# Patient Record
Sex: Male | Born: 1950
Health system: Southern US, Community
[De-identification: ages and names within clinical notes are randomized; demographics above are authoritative.]

## PROBLEM LIST (undated history)

## (undated) DIAGNOSIS — M109 Gout, unspecified: Secondary | ICD-10-CM

## (undated) DIAGNOSIS — C61 Malignant neoplasm of prostate: Secondary | ICD-10-CM

## (undated) DIAGNOSIS — M1990 Unspecified osteoarthritis, unspecified site: Secondary | ICD-10-CM

## (undated) DIAGNOSIS — E119 Type 2 diabetes mellitus without complications: Secondary | ICD-10-CM

## (undated) DIAGNOSIS — C801 Malignant (primary) neoplasm, unspecified: Secondary | ICD-10-CM

## (undated) DIAGNOSIS — E785 Hyperlipidemia, unspecified: Secondary | ICD-10-CM

## (undated) HISTORY — PX: PROSTATE SURGERY: SHX751

## (undated) HISTORY — DX: Type 2 diabetes mellitus without complications: E11.9

## (undated) HISTORY — DX: Hyperlipidemia, unspecified: E78.5

## (undated) HISTORY — DX: Malignant neoplasm of prostate: C61

## (undated) HISTORY — DX: Gout, unspecified: M10.9

---

## 2000-08-31 ENCOUNTER — Encounter: Payer: Self-pay | Admitting: Family Medicine

## 2000-08-31 ENCOUNTER — Encounter: Admission: RE | Admit: 2000-08-31 | Discharge: 2000-08-31 | Payer: Self-pay | Admitting: Family Medicine

## 2002-05-25 DIAGNOSIS — C801 Malignant (primary) neoplasm, unspecified: Secondary | ICD-10-CM

## 2002-05-25 HISTORY — DX: Malignant (primary) neoplasm, unspecified: C80.1

## 2002-09-25 ENCOUNTER — Ambulatory Visit (HOSPITAL_COMMUNITY): Admission: RE | Admit: 2002-09-25 | Discharge: 2002-09-25 | Payer: Self-pay | Admitting: Gastroenterology

## 2003-06-26 ENCOUNTER — Inpatient Hospital Stay (HOSPITAL_COMMUNITY): Admission: RE | Admit: 2003-06-26 | Discharge: 2003-06-29 | Payer: Self-pay | Admitting: Urology

## 2012-09-13 ENCOUNTER — Ambulatory Visit (INDEPENDENT_AMBULATORY_CARE_PROVIDER_SITE_OTHER): Payer: 59 | Admitting: *Deleted

## 2012-09-13 DIAGNOSIS — Z2911 Encounter for prophylactic immunotherapy for respiratory syncytial virus (RSV): Secondary | ICD-10-CM

## 2012-09-13 DIAGNOSIS — Z23 Encounter for immunization: Secondary | ICD-10-CM

## 2012-09-13 DIAGNOSIS — Z111 Encounter for screening for respiratory tuberculosis: Secondary | ICD-10-CM

## 2012-09-15 ENCOUNTER — Encounter (INDEPENDENT_AMBULATORY_CARE_PROVIDER_SITE_OTHER): Payer: 59 | Admitting: Family Medicine

## 2012-09-15 LAB — TB SKIN TEST
Induration: 0 mm
TB Skin Test: NEGATIVE

## 2012-09-15 NOTE — Progress Notes (Signed)
This encounter was created in error - please disregard.

## 2012-10-20 ENCOUNTER — Telehealth: Payer: Self-pay | Admitting: Family Medicine

## 2012-10-20 MED ORDER — ROSUVASTATIN CALCIUM 40 MG PO TABS: 40.0000 mg | ORAL_TABLET | Freq: Every day | ORAL | Status: DC

## 2012-10-20 MED ORDER — FEBUXOSTAT 80 MG PO TABS: 80.0000 mg | ORAL_TABLET | Freq: Every day | ORAL | Status: DC

## 2012-10-20 NOTE — Telephone Encounter (Signed)
Rx Refilled  

## 2012-10-20 NOTE — Telephone Encounter (Signed)
Med rf °

## 2013-01-19 ENCOUNTER — Encounter: Payer: Self-pay | Admitting: Family Medicine

## 2013-01-19 ENCOUNTER — Ambulatory Visit (INDEPENDENT_AMBULATORY_CARE_PROVIDER_SITE_OTHER): Payer: 59 | Admitting: Family Medicine

## 2013-01-19 VITALS — BP 136/90 | HR 80 | Temp 98.3°F | Resp 16 | Wt 281.0 lb

## 2013-01-19 DIAGNOSIS — M109 Gout, unspecified: Secondary | ICD-10-CM | POA: Insufficient documentation

## 2013-01-19 DIAGNOSIS — T22299A Burn of second degree of multiple sites of unspecified shoulder and upper limb, except wrist and hand, initial encounter: Secondary | ICD-10-CM

## 2013-01-19 DIAGNOSIS — T22292A Burn of second degree of multiple sites of left shoulder and upper limb, except wrist and hand, initial encounter: Secondary | ICD-10-CM

## 2013-01-19 MED ORDER — SILVER SULFADIAZINE 1 % EX CREA: TOPICAL_CREAM | Freq: Every day | CUTANEOUS | Status: DC

## 2013-01-19 MED ORDER — CEPHALEXIN 500 MG PO CAPS: 500.0000 mg | ORAL_CAPSULE | Freq: Three times a day (TID) | ORAL | Status: DC
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
## 2013-03-17 ENCOUNTER — Other Ambulatory Visit: Payer: Self-pay | Admitting: Family Medicine

## 2013-03-17 NOTE — Telephone Encounter (Signed)
Medication refilled per protocol. 

## 2013-06-07 ENCOUNTER — Encounter: Payer: Self-pay | Admitting: Physician Assistant

## 2013-06-07 ENCOUNTER — Ambulatory Visit (INDEPENDENT_AMBULATORY_CARE_PROVIDER_SITE_OTHER): Payer: 59 | Admitting: Physician Assistant

## 2013-06-07 VITALS — BP 142/88 | HR 112 | Temp 96.9°F | Resp 20 | Wt 244.0 lb

## 2013-06-07 DIAGNOSIS — Z8546 Personal history of malignant neoplasm of prostate: Secondary | ICD-10-CM

## 2013-06-07 DIAGNOSIS — R1310 Dysphagia, unspecified: Secondary | ICD-10-CM

## 2013-06-07 DIAGNOSIS — K219 Gastro-esophageal reflux disease without esophagitis: Secondary | ICD-10-CM

## 2013-06-07 DIAGNOSIS — G255 Other chorea: Secondary | ICD-10-CM

## 2013-06-07 DIAGNOSIS — B370 Candidal stomatitis: Secondary | ICD-10-CM

## 2013-06-07 DIAGNOSIS — R634 Abnormal weight loss: Secondary | ICD-10-CM

## 2013-06-07 DIAGNOSIS — H532 Diplopia: Secondary | ICD-10-CM

## 2013-06-07 LAB — COMPLETE METABOLIC PANEL WITH GFR
ALBUMIN: 4.4 g/dL (ref 3.5–5.2)
ALT: 15 U/L (ref 0–53)
AST: 15 U/L (ref 0–37)
Alkaline Phosphatase: 125 U/L — ABNORMAL HIGH (ref 39–117)
BUN: 59 mg/dL — AB (ref 6–23)
CALCIUM: 11.1 mg/dL — AB (ref 8.4–10.5)
CHLORIDE: 88 meq/L — AB (ref 96–112)
CO2: 19 mEq/L (ref 19–32)
Creat: 2.48 mg/dL — ABNORMAL HIGH (ref 0.50–1.35)
GFR, Est African American: 31 mL/min — ABNORMAL LOW
GFR, Est Non African American: 27 mL/min — ABNORMAL LOW
Glucose, Bld: 1254 mg/dL (ref 70–99)
POTASSIUM: 5.9 meq/L — AB (ref 3.5–5.3)
SODIUM: 136 meq/L (ref 135–145)
TOTAL PROTEIN: 9 g/dL — AB (ref 6.0–8.3)
Total Bilirubin: 0.5 mg/dL (ref 0.3–1.2)

## 2013-06-07 LAB — CBC WITH DIFFERENTIAL/PLATELET
BASOS ABS: 0.1 10*3/uL (ref 0.0–0.1)
Basophils Relative: 1 % (ref 0–1)
Eosinophils Absolute: 0 10*3/uL (ref 0.0–0.7)
Eosinophils Relative: 0 % (ref 0–5)
HEMATOCRIT: 46.3 % (ref 39.0–52.0)
HEMOGLOBIN: 14.1 g/dL (ref 13.0–17.0)
LYMPHS PCT: 19 % (ref 12–46)
Lymphs Abs: 1.8 10*3/uL (ref 0.7–4.0)
MCH: 28.7 pg (ref 26.0–34.0)
MCHC: 30.5 g/dL (ref 30.0–36.0)
MCV: 94.3 fL (ref 78.0–100.0)
MONOS PCT: 5 % (ref 3–12)
Monocytes Absolute: 0.4 10*3/uL (ref 0.1–1.0)
NEUTROS ABS: 7.3 10*3/uL (ref 1.7–7.7)
Neutrophils Relative %: 75 % (ref 43–77)
Platelets: 346 10*3/uL (ref 150–400)
RBC: 4.91 MIL/uL (ref 4.22–5.81)
RDW: 13.7 % (ref 11.5–15.5)
WBC: 9.6 10*3/uL (ref 4.0–10.5)

## 2013-06-07 LAB — CK: CK TOTAL: 501 U/L — AB (ref 7–232)

## 2013-06-07 LAB — VITAMIN B12: Vitamin B-12: 1358 pg/mL — ABNORMAL HIGH (ref 211–911)

## 2013-06-07 LAB — HIV ANTIBODY (ROUTINE TESTING W REFLEX): HIV: NONREACTIVE

## 2013-06-07 LAB — TSH: TSH: 0.788 u[IU]/mL (ref 0.350–4.500)

## 2013-06-07 LAB — RPR

## 2013-06-07 MED ORDER — OMEPRAZOLE 20 MG PO CPDR: 20.0000 mg | DELAYED_RELEASE_CAPSULE | Freq: Every day | ORAL | Status: DC

## 2013-06-07 MED ORDER — NYSTATIN 100000 UNIT/ML MT SUSP: 5.0000 mL | Freq: Four times a day (QID) | OROMUCOSAL | Status: DC

## 2013-06-08 ENCOUNTER — Inpatient Hospital Stay (HOSPITAL_COMMUNITY): Payer: 59

## 2013-06-08 ENCOUNTER — Other Ambulatory Visit: Payer: Self-pay

## 2013-06-08 ENCOUNTER — Inpatient Hospital Stay (HOSPITAL_COMMUNITY)
Admission: EM | Admit: 2013-06-08 | Discharge: 2013-06-15 | DRG: 637 | Disposition: A | Discharge status: Home / Self Care | Payer: 59 | Attending: Internal Medicine | Admitting: Internal Medicine

## 2013-06-08 ENCOUNTER — Encounter (HOSPITAL_COMMUNITY): Payer: Self-pay | Admitting: Emergency Medicine

## 2013-06-08 DIAGNOSIS — E119 Type 2 diabetes mellitus without complications: Secondary | ICD-10-CM | POA: Diagnosis present

## 2013-06-08 DIAGNOSIS — R259 Unspecified abnormal involuntary movements: Secondary | ICD-10-CM

## 2013-06-08 DIAGNOSIS — G9341 Metabolic encephalopathy: Secondary | ICD-10-CM | POA: Diagnosis present

## 2013-06-08 DIAGNOSIS — E1110 Type 2 diabetes mellitus with ketoacidosis without coma: Secondary | ICD-10-CM | POA: Diagnosis present

## 2013-06-08 DIAGNOSIS — E870 Hyperosmolality and hypernatremia: Secondary | ICD-10-CM | POA: Diagnosis present

## 2013-06-08 DIAGNOSIS — Z7982 Long term (current) use of aspirin: Secondary | ICD-10-CM

## 2013-06-08 DIAGNOSIS — E785 Hyperlipidemia, unspecified: Secondary | ICD-10-CM | POA: Diagnosis present

## 2013-06-08 DIAGNOSIS — E860 Dehydration: Secondary | ICD-10-CM | POA: Diagnosis present

## 2013-06-08 DIAGNOSIS — E875 Hyperkalemia: Secondary | ICD-10-CM | POA: Diagnosis present

## 2013-06-08 DIAGNOSIS — M6282 Rhabdomyolysis: Secondary | ICD-10-CM | POA: Diagnosis present

## 2013-06-08 DIAGNOSIS — Z833 Family history of diabetes mellitus: Secondary | ICD-10-CM

## 2013-06-08 DIAGNOSIS — Z79899 Other long term (current) drug therapy: Secondary | ICD-10-CM

## 2013-06-08 DIAGNOSIS — E1310 Other specified diabetes mellitus with ketoacidosis without coma: Principal | ICD-10-CM | POA: Diagnosis present

## 2013-06-08 DIAGNOSIS — K219 Gastro-esophageal reflux disease without esophagitis: Secondary | ICD-10-CM | POA: Diagnosis present

## 2013-06-08 DIAGNOSIS — B370 Candidal stomatitis: Secondary | ICD-10-CM | POA: Diagnosis present

## 2013-06-08 DIAGNOSIS — R252 Cramp and spasm: Secondary | ICD-10-CM | POA: Diagnosis present

## 2013-06-08 DIAGNOSIS — M109 Gout, unspecified: Secondary | ICD-10-CM

## 2013-06-08 DIAGNOSIS — Z794 Long term (current) use of insulin: Secondary | ICD-10-CM

## 2013-06-08 DIAGNOSIS — N179 Acute kidney failure, unspecified: Secondary | ICD-10-CM | POA: Diagnosis present

## 2013-06-08 DIAGNOSIS — R634 Abnormal weight loss: Secondary | ICD-10-CM | POA: Diagnosis present

## 2013-06-08 DIAGNOSIS — I889 Nonspecific lymphadenitis, unspecified: Secondary | ICD-10-CM | POA: Diagnosis present

## 2013-06-08 DIAGNOSIS — Z8546 Personal history of malignant neoplasm of prostate: Secondary | ICD-10-CM

## 2013-06-08 DIAGNOSIS — R911 Solitary pulmonary nodule: Secondary | ICD-10-CM | POA: Diagnosis present

## 2013-06-08 DIAGNOSIS — M793 Panniculitis, unspecified: Secondary | ICD-10-CM | POA: Diagnosis present

## 2013-06-08 HISTORY — DX: Malignant (primary) neoplasm, unspecified: C80.1

## 2013-06-08 LAB — CK: Total CK: 2716 U/L — ABNORMAL HIGH (ref 7–232)

## 2013-06-08 LAB — GLUCOSE, CAPILLARY
Glucose-Capillary: >600 mg/dL (ref 70–99)
Glucose-Capillary: >600 mg/dL (ref 70–99)
Glucose-Capillary: >600 mg/dL (ref 70–99)
Glucose-Capillary: >600 mg/dL (ref 70–99)
Glucose-Capillary: >600 mg/dL (ref 70–99)
Glucose-Capillary: 584 mg/dL (ref 70–99)
Glucose-Capillary: 509 mg/dL — ABNORMAL HIGH (ref 70–99)
Glucose-Capillary: 451 mg/dL — ABNORMAL HIGH (ref 70–99)
Glucose-Capillary: 451 mg/dL — ABNORMAL HIGH (ref 70–99)
Glucose-Capillary: 382 mg/dL — ABNORMAL HIGH (ref 70–99)
Glucose-Capillary: 346 mg/dL — ABNORMAL HIGH (ref 70–99)

## 2013-06-08 LAB — CBC WITH DIFFERENTIAL/PLATELET
BASOS ABS: 0 10*3/uL (ref 0.0–0.1)
BASOS PCT: 0 % (ref 0–1)
EOS ABS: 0 10*3/uL (ref 0.0–0.7)
EOS PCT: 0 % (ref 0–5)
HEMATOCRIT: 48.4 % (ref 39.0–52.0)
Hemoglobin: 14.5 g/dL (ref 13.0–17.0)
Lymphocytes Relative: 9 % — ABNORMAL LOW (ref 12–46)
Lymphs Abs: 1.2 10*3/uL (ref 0.7–4.0)
MCH: 28.8 pg (ref 26.0–34.0)
MCHC: 30 g/dL (ref 30.0–36.0)
MCV: 96.2 fL (ref 78.0–100.0)
MONO ABS: 0.8 10*3/uL (ref 0.1–1.0)
Monocytes Relative: 6 % (ref 3–12)
Neutro Abs: 10.7 10*3/uL — ABNORMAL HIGH (ref 1.7–7.7)
Neutrophils Relative %: 85 % — ABNORMAL HIGH (ref 43–77)
Platelets: 308 10*3/uL (ref 150–400)
RBC: 5.03 MIL/uL (ref 4.22–5.81)
RDW: 14.4 % (ref 11.5–15.5)
WBC: 12.6 10*3/uL — ABNORMAL HIGH (ref 4.0–10.5)

## 2013-06-08 LAB — COMPREHENSIVE METABOLIC PANEL
ALT: 18 U/L (ref 0–53)
AST: 26 U/L (ref 0–37)
Albumin: 3.9 g/dL (ref 3.5–5.2)
Alkaline Phosphatase: 121 U/L — ABNORMAL HIGH (ref 39–117)
BUN: 87 mg/dL — ABNORMAL HIGH (ref 6–23)
CO2: 16 mEq/L — ABNORMAL LOW (ref 19–32)
Calcium: 10.5 mg/dL (ref 8.4–10.5)
Chloride: 89 mEq/L — ABNORMAL LOW (ref 96–112)
Creatinine, Ser: 3.04 mg/dL — ABNORMAL HIGH (ref 0.50–1.35)
GFR calc Af Amer: 24 mL/min — ABNORMAL LOW (ref >90)
GFR calc non Af Amer: 20 mL/min — ABNORMAL LOW (ref >90)
Glucose, Bld: 1480 mg/dL (ref 70–99)
Potassium: 6 mEq/L — ABNORMAL HIGH (ref 3.7–5.3)
Sodium: 138 mEq/L (ref 137–147)
Total Bilirubin: 0.3 mg/dL (ref 0.3–1.2)
Total Protein: 9.1 g/dL — ABNORMAL HIGH (ref 6.0–8.3)

## 2013-06-08 LAB — CBC
HCT: 44.8 % (ref 39.0–52.0)
Hemoglobin: 15 g/dL (ref 13.0–17.0)
MCH: 29.5 pg (ref 26.0–34.0)
MCHC: 33.5 g/dL (ref 30.0–36.0)
MCV: 88.2 fL (ref 78.0–100.0)
Platelets: 269 10*3/uL (ref 150–400)
RBC: 5.08 MIL/uL (ref 4.22–5.81)
RDW: 14 % (ref 11.5–15.5)
WBC: 13.6 10*3/uL — ABNORMAL HIGH (ref 4.0–10.5)

## 2013-06-08 LAB — BASIC METABOLIC PANEL
BUN: 77 mg/dL — AB (ref 6–23)
BUN: 78 mg/dL — ABNORMAL HIGH (ref 6–23)
BUN: 72 mg/dL — ABNORMAL HIGH (ref 6–23)
CALCIUM: 10 mg/dL (ref 8.4–10.5)
CALCIUM: 10.3 mg/dL (ref 8.4–10.5)
CO2: 21 mEq/L (ref 19–32)
CO2: 21 mEq/L (ref 19–32)
CO2: 21 mEq/L (ref 19–32)
CREATININE: 2.65 mg/dL — AB (ref 0.50–1.35)
CREATININE: 2.4 mg/dL — AB (ref 0.50–1.35)
Calcium: 10.6 mg/dL — ABNORMAL HIGH (ref 8.4–10.5)
Chloride: 110 mEq/L (ref 96–112)
Chloride: 111 mEq/L (ref 96–112)
Chloride: 117 mEq/L — ABNORMAL HIGH (ref 96–112)
Creatinine, Ser: 2.64 mg/dL — ABNORMAL HIGH (ref 0.50–1.35)
GFR calc Af Amer: 31 mL/min — ABNORMAL LOW (ref >90)
GFR calc non Af Amer: 24 mL/min — ABNORMAL LOW (ref >90)
GFR, EST AFRICAN AMERICAN: 28 mL/min — AB (ref >90)
GFR, EST AFRICAN AMERICAN: 28 mL/min — AB (ref >90)
GFR, EST NON AFRICAN AMERICAN: 24 mL/min — AB (ref >90)
GFR, EST NON AFRICAN AMERICAN: 27 mL/min — AB (ref >90)
GLUCOSE: 875 mg/dL — AB (ref 70–99)
Glucose, Bld: 669 mg/dL (ref 70–99)
POTASSIUM: 5.2 meq/L (ref 3.7–5.3)
Potassium: 5.2 mEq/L (ref 3.7–5.3)
Potassium: 4.3 mEq/L (ref 3.7–5.3)
SODIUM: 154 meq/L — AB (ref 137–147)
SODIUM: 158 meq/L — AB (ref 137–147)
Sodium: 153 mEq/L — ABNORMAL HIGH (ref 137–147)

## 2013-06-08 LAB — URINALYSIS, ROUTINE W REFLEX MICROSCOPIC
Bilirubin Urine: NEGATIVE
Glucose, UA: >1000 mg/dL — AB
Ketones, ur: 15 mg/dL — AB
Leukocytes, UA: NEGATIVE
Nitrite: NEGATIVE
Protein, ur: NEGATIVE mg/dL
Specific Gravity, Urine: 1.034 — ABNORMAL HIGH (ref 1.005–1.030)
Urobilinogen, UA: 0.2 mg/dL (ref 0.0–1.0)
pH: 5 (ref 5.0–8.0)

## 2013-06-08 LAB — POCT I-STAT 3, VENOUS BLOOD GAS (G3P V)
Acid-base deficit: 7 mmol/L — ABNORMAL HIGH (ref 0.0–2.0)
Bicarbonate: 19.3 mEq/L — ABNORMAL LOW (ref 20.0–24.0)
O2 Saturation: 98 %
TCO2: 21 mmol/L (ref 0–100)
pCO2, Ven: 42.2 mmHg — ABNORMAL LOW (ref 45.0–50.0)
pH, Ven: 7.268 (ref 7.250–7.300)
pO2, Ven: 122 mmHg — ABNORMAL HIGH (ref 30.0–45.0)

## 2013-06-08 LAB — URINE MICROSCOPIC-ADD ON

## 2013-06-08 LAB — HIV ANTIBODY (ROUTINE TESTING W REFLEX): HIV: NONREACTIVE

## 2013-06-08 LAB — BASIC METABOLIC PANEL WITH GFR
BUN: 72 mg/dL — ABNORMAL HIGH (ref 6–23)
CO2: 22 meq/L (ref 19–32)
Calcium: 10.4 mg/dL (ref 8.4–10.5)
Chloride: 122 meq/L — ABNORMAL HIGH (ref 96–112)
Creatinine, Ser: 2.41 mg/dL — ABNORMAL HIGH (ref 0.50–1.35)
GFR calc Af Amer: 31 mL/min — ABNORMAL LOW
GFR calc non Af Amer: 27 mL/min — ABNORMAL LOW
Glucose, Bld: 491 mg/dL — ABNORMAL HIGH (ref 70–99)
Potassium: 4.4 meq/L (ref 3.7–5.3)
Sodium: 162 meq/L — ABNORMAL HIGH (ref 137–147)

## 2013-06-08 LAB — MRSA PCR SCREENING: MRSA BY PCR: NEGATIVE

## 2013-06-08 LAB — PSA: PSA: 0.11 ng/mL (ref <=4.00)

## 2013-06-08 MED ORDER — DEXTROSE-NACL 5-0.45 % IV SOLN: INTRAVENOUS | Status: DC

## 2013-06-08 MED ORDER — SODIUM CHLORIDE 0.9 % IV BOLUS (SEPSIS)
1000.0000 mL | Freq: Once | INTRAVENOUS | Status: AC
  Administered 2013-06-08: 1000 mL via INTRAVENOUS

## 2013-06-08 MED ORDER — SODIUM CHLORIDE 0.9 % IV SOLN: INTRAVENOUS | Status: DC

## 2013-06-08 MED ORDER — PANTOPRAZOLE SODIUM 40 MG IV SOLR
40.0000 mg | Freq: Two times a day (BID) | INTRAVENOUS | Status: DC
  Administered 2013-06-08 – 2013-06-09 (×3): 40 mg via INTRAVENOUS
  Filled 2013-06-08 (×5): qty 40

## 2013-06-08 MED ORDER — SODIUM CHLORIDE 0.45 % IV SOLN
INTRAVENOUS | Status: DC
  Administered 2013-06-08: 22:00:00 via INTRAVENOUS

## 2013-06-08 MED ORDER — ONDANSETRON HCL 4 MG/2ML IJ SOLN: 4.0000 mg | Freq: Once | INTRAMUSCULAR | Status: DC

## 2013-06-08 MED ORDER — ENOXAPARIN SODIUM 40 MG/0.4ML ~~LOC~~ SOLN
40.0000 mg | SUBCUTANEOUS | Status: DC
  Administered 2013-06-08 – 2013-06-14 (×7): 40 mg via SUBCUTANEOUS
  Filled 2013-06-08 (×8): qty 0.4

## 2013-06-08 MED ORDER — ASPIRIN 300 MG RE SUPP
300.0000 mg | Freq: Every day | RECTAL | Status: DC
  Filled 2013-06-08 (×3): qty 1

## 2013-06-08 MED ORDER — FEBUXOSTAT 40 MG PO TABS
80.0000 mg | ORAL_TABLET | Freq: Every day | ORAL | Status: DC
  Administered 2013-06-08 – 2013-06-15 (×8): 80 mg via ORAL
  Filled 2013-06-08 (×8): qty 2

## 2013-06-08 MED ORDER — DEXTROSE-NACL 5-0.45 % IV SOLN
INTRAVENOUS | Status: DC
Start: 2013-06-08 — End: 2013-06-08

## 2013-06-08 MED ORDER — DEXTROSE 50 % IV SOLN: 25.0000 mL | INTRAVENOUS | Status: DC | PRN

## 2013-06-08 MED ORDER — SODIUM CHLORIDE 0.9 % IV SOLN: INTRAVENOUS | Status: AC

## 2013-06-08 MED ORDER — SODIUM CHLORIDE 0.9 % IV SOLN
INTRAVENOUS | Status: DC
  Administered 2013-06-08: 1000 mL via INTRAVENOUS

## 2013-06-08 MED ORDER — ASPIRIN EC 325 MG PO TBEC
325.0000 mg | DELAYED_RELEASE_TABLET | Freq: Every day | ORAL | Status: DC
  Administered 2013-06-08 – 2013-06-09 (×2): 325 mg via ORAL
  Filled 2013-06-08 (×3): qty 1

## 2013-06-08 MED ORDER — NYSTATIN 100000 UNIT/ML MT SUSP
5.0000 mL | Freq: Four times a day (QID) | OROMUCOSAL | Status: DC
  Administered 2013-06-08 – 2013-06-15 (×26): 500000 [IU] via ORAL
  Filled 2013-06-08 (×30): qty 5

## 2013-06-08 MED ORDER — ATORVASTATIN CALCIUM 80 MG PO TABS
80.0000 mg | ORAL_TABLET | Freq: Every day | ORAL | Status: DC
  Administered 2013-06-08 – 2013-06-14 (×7): 80 mg via ORAL
  Filled 2013-06-08 (×8): qty 1

## 2013-06-08 MED ORDER — SODIUM CHLORIDE 0.9 % IV SOLN
INTRAVENOUS | Status: DC
  Administered 2013-06-08: 5.4 [IU]/h via INTRAVENOUS
  Filled 2013-06-08: qty 1

## 2013-06-08 MED ORDER — FLUCONAZOLE 100MG IVPB
100.0000 mg | INTRAVENOUS | Status: DC
  Administered 2013-06-08 – 2013-06-09 (×2): 100 mg via INTRAVENOUS
  Filled 2013-06-08 (×3): qty 50

## 2013-06-08 MED ORDER — SODIUM CHLORIDE 0.9 % IV SOLN
INTRAVENOUS | Status: DC
  Administered 2013-06-08: 12.9 [IU]/h via INTRAVENOUS
  Administered 2013-06-08: 7.8 [IU]/h via INTRAVENOUS
  Administered 2013-06-09: 17:00:00 via INTRAVENOUS
  Administered 2013-06-09: 20.3 [IU]/h via INTRAVENOUS
  Administered 2013-06-10: 8.5 [IU]/h via INTRAVENOUS
  Filled 2013-06-08 (×4): qty 1

## 2013-06-08 MED ORDER — FEBUXOSTAT 80 MG PO TABS: 80.0000 mg | ORAL_TABLET | Freq: Every day | ORAL | Status: DC

## 2013-06-08 NOTE — Progress Notes (Signed)
UR conmpleted 

## 2013-06-08 NOTE — ED Notes (Signed)
Admitting MD at bedside.

## 2013-06-08 NOTE — Care Management Note (Addendum)
    Page 1 of 1   06/09/2013     11:44:35 AM   CARE MANAGEMENT NOTE 06/09/2013  Patient:  Curtis Hunt, Curtis Hunt   Account Number:  [id number]  Date Initiated:  06/08/2013  Documentation initiated by:  Elissa Hefty  Subjective/Objective Assessment:   adm w hyperglycemia     Action/Plan:   lives w wife, pcp dr Cletus Gash pickard   Anticipated DC Date:     Anticipated DC Plan:           Choice offered to / List presented to:             Status of service:   Medicare Important Message given?   (If response is "NO", the following Medicare IM given date fields will be blank) Date Medicare IM given:   Date Additional Medicare IM given:    Discharge Disposition:    Per UR Regulation:  Reviewed for med. necessity/level of care/duration of stay  If discussed at [place] of Stay Meetings, dates discussed:    Comments:  1/16 1143a debbie Maheen Cwikla rn,bsn pt has 40.00 copay for ea ins per cm sec. her glumeter and strips have coverage but was not able to get exact amt.

## 2013-06-08 NOTE — Progress Notes (Signed)
Curtis Hunt was notified about pt's increased sodium 158 and potassium 4.3. New order received and followed.

## 2013-06-08 NOTE — ED Notes (Signed)
Attempted to call report.  RN unavailable and stated she would call back.

## 2013-06-08 NOTE — ED Provider Notes (Signed)
CSN: [id number]     Arrival date & time 06/08/13  0907 History   First MD Initiated Contact with Patient 06/08/13 [phone]     Chief Complaint  Patient presents with  . Hyperglycemia   (Consider location/radiation/quality/duration/timing/severity/associated sxs/prior Treatment) HPI  PCP: [facility]- Jenna Luo, MD  Patient with a PMH of gout and hyperlipidemia presents to the ER by EMS with complaints of polydipsia, polyuria, weight loss, lethargy, sinus tachycardia. Patient had labs drawn and was called by his PCP's office and told he had abnormal labs. He had planned to go get a scan (pt not sure of what). He states that he does not have belly pain and has not been vomiting, he reports feeling very poorly. EMS noted that his CBG is elevated, consistent with labs drawn yesterday. He says that he is not a known diabetic and has never known himself to have elevated sugars. He says that he has many family members that are diabetic.   In triage pt is noted to be tachycardic and have acetone breath. Is is awake and alert, able to answer questions appropriately.   Past Medical History  Diagnosis Date  . Hyperlipidemia   . Gout    History reviewed. No pertinent past surgical history. No family history on file. History  Substance Use Topics  . Smoking status: Never Smoker   . Smokeless tobacco: Not on file  . Alcohol Use: No    Review of Systems The patient denies anorexia, fever, vision loss, decreased hearing, hoarseness, chest pain, syncope, dyspnea on exertion, peripheral edema, balance deficits, hemoptysis, abdominal pain, melena, hematochezia, severe indigestion/heartburn, hematuria, incontinence, genital sores,  suspicious skin lesions, transient blindness, difficulty walking, depression, unusual weight change, abnormal bleeding, enlarged lymph nodes, angioedema, and breast masses.  Allergies  Review of patient's allergies indicates no known allergies.  Home  Medications   Current Outpatient Rx  Name  Route  Sig  Dispense  Refill  . aspirin 81 MG tablet   Oral   Take 81 mg by mouth daily.         . Febuxostat 80 MG TABS   Oral   Take 80 mg by mouth daily.         Marland Kitchen nystatin (MYCOSTATIN) 100000 UNIT/ML suspension   Oral   Take 5 mLs by mouth 4 (four) times daily. Swish and swallow         . omeprazole (PRILOSEC) 20 MG capsule   Oral   Take 20 mg by mouth daily.         . rosuvastatin (CRESTOR) 40 MG tablet   Oral   Take 40 mg by mouth daily.         . rosuvastatin (CRESTOR) 40 MG tablet      TAKE 1 TABLET (40 MG TOTAL) BY MOUTH DAILY.          BP 115/81  Pulse 102  Temp(Src) 97.7 F (36.5 C) (Oral)  Resp 18  Ht 6\' 1"  (1.854 m)  Wt 238 lb (107.956 kg)  BMI 31.41 kg/m2  SpO2 97% Physical Exam  Nursing note and vitals reviewed. Constitutional: He appears well-developed and well-nourished. He appears distressed.  HENT:  Head: Normocephalic and atraumatic.  Eyes: Pupils are equal, round, and reactive to light.  Neck: Normal range of motion. Neck supple.  Cardiovascular: Regular rhythm.  Tachycardia present.   Pulmonary/Chest: Effort normal.  Abdominal: Soft.  Neurological: He is alert.  Skin: Skin is warm. He is diaphoretic.  ED Course  Procedures (including critical care time) Labs Review Labs Reviewed  GLUCOSE, CAPILLARY - Abnormal; Notable for the following:    Glucose-Capillary >600 (*)    All other components within normal limits  CBC WITH DIFFERENTIAL - Abnormal; Notable for the following:    WBC 12.6 (*)    Neutrophils Relative % 85 (*)    Neutro Abs 10.7 (*)    Lymphocytes Relative 9 (*)    All other components within normal limits  COMPREHENSIVE METABOLIC PANEL - Abnormal; Notable for the following:    Potassium 6.0 (*)    Chloride 89 (*)    CO2 16 (*)    Glucose, Bld 1480 (*)    BUN 87 (*)    Creatinine, Ser 3.04 (*)    Total Protein 9.1 (*)    Alkaline Phosphatase 121 (*)    GFR  calc non Af Amer 20 (*)    GFR calc Af Amer 24 (*)    All other components within normal limits  URINALYSIS, ROUTINE W REFLEX MICROSCOPIC - Abnormal; Notable for the following:    Specific Gravity, Urine 1.034 (*)    Glucose, UA >1000 (*)    Hgb urine dipstick LARGE (*)    Ketones, ur 15 (*)    All other components within normal limits  POCT I-STAT 3, BLOOD GAS (G3P V) - Abnormal; Notable for the following:    pCO2, Ven 42.2 (*)    pO2, Ven 122.0 (*)    Bicarbonate 19.3 (*)    Acid-base deficit 7.0 (*)    All other components within normal limits  URINE MICROSCOPIC-ADD ON   Imaging Review No results found.  EKG Interpretation   None       MDM   1. Diabetic ketoacidosis     CRITICAL CARE Performed by: Linus Mako Total critical care time: 30 Critical care time was exclusive of separately billable procedures and treating other patients. Critical care was necessary to treat or prevent imminent or life-threatening deterioration. Critical care was time spent personally by me on the following activities: development of treatment plan with patient and/or surrogate as well as nursing, discussions with consultants, evaluation of patient's response to treatment, examination of patient, obtaining history from patient or surrogate, ordering and performing treatments and interventions, ordering and review of laboratory studies, ordering and review of radiographic studies, pulse oximetry and re-evaluation of patient's condition.    Pt with diabetic ketoacidosis with an anion gap of 30 to the ER with no known hx of diabetes. He had labs drawn by his PCP yesterday and EMS was called from the PCP to have patient brought to the ER. His glucose is greater than 1400.   Patient started on 2 L of fluids, his labs show many abnormalities. He is awake, alert and oriented.   glucostabilizer started.  Patient admitted to Dr. Tana Coast, [facility] Team 10, stepdown.   Linus Mako,  PA-C 06/08/13 1135

## 2013-06-08 NOTE — Progress Notes (Signed)
Patient ID: Curtis Hunt MRN: [id number], DOB: [birth year], 64 y.o. Date of Encounter: @DATE @  Chief Complaint:  Chief Complaint  Patient presents with  . feeling week x 3 days    alittle congestion, denies chills, body ache, having trouble eating    HPI: 63 y.o. year old AA  male  presents with multiple complaints.  Patient reports that for the past one month " food keeps getting stuck in his throat". I specifically asked whether he was having any difficulty at all around the time of Thanksgiving. He says that at that time his swallowing was normal. However, this dysphagia has progressed to the point where for the past one week the only things he can eat ice cream and cornflakes.   Reports he has also been having some symptoms of GERD and has been taking TUMS for this. Says that this is also near [place] the past one month. Says that he sometimes taste he vomited T. taste in his mouth. Also sometimes has actual heartburn type symptoms.  Has had no abdominal pain whatsoever.  I reviewed prior weights in the chart.  Weight April 2013 was 268. Weight March 2014 was 273. Weight today 244.  Reports he has had no change in his bowels as far as no diarrhea and no constipation. However he does state that he has had decreased number of bowel movements secondary to decreased food intake.  Also reports that for the past one to 2 weeks he has been having " double vision." When asked if he was seeing 2 of me. He says now he sees one of many things around the periphery are double.  Again asked about if he is sitting at home watching TV. Again he says that he is focused on the TV he sees one of the TV but the things around the edges seem to be double.   Also of note is that during the visit both of his arms twitch and jerk at random. Ask how long this has been going on. He says this "has been happening for about one to 2 weeks--started about the same time as the other problems." Has had no pain  in his neck or down his arms. No numbness tingling paresthesias. He reports that he has noticed no other neurologic changes. No change in his gait no problems with imbalance. He drinks no alcohol very rare and that uses no illicit drugs. Has had no headache and no blurred vision.  Has remote history of prostate cancer in 2004. Had prostatectomy but no other treatment as far as chemotherapy and radiation. Says that he still sees urology on an annual basis and just had his last visit here 3 months ago and that was all normal.  Last colonoscopy was 08/17/2011.      Past Medical History  Diagnosis Date  . Hyperlipidemia   . Gout      Home Meds: See attached medication section for current medication list. Any medications entered into computer today will not appear on this note's list. The medications listed below were entered prior to today. Current Outpatient Prescriptions on File Prior to Visit  Medication Sig Dispense Refill  . CRESTOR 40 MG tablet TAKE 1 TABLET (40 MG TOTAL) BY MOUTH DAILY.  30 tablet  3  . Febuxostat (ULORIC) 80 MG TABS Take 1 tablet (80 mg total) by mouth daily.  30 tablet  5   No current facility-administered medications on file prior to visit.    Allergies: No  Known Allergies  History   Social History  . Marital Status: Single    Spouse Name: N/A    Number of Children: N/A  . Years of Education: N/A   Occupational History  . Not on file.   Social History Main Topics  . Smoking status: Never Smoker   . Smokeless tobacco: Not on file  . Alcohol Use: No  . Drug Use: No  . Sexual Activity: Not on file   Other Topics Concern  . Not on file   Social History Narrative  . No narrative on file    No family history on file.   Review of Systems:  See HPI for pertinent ROS. All other ROS negative.    Physical Exam: Blood pressure 142/88, pulse 112, temperature 96.9 F (36.1 C), temperature source Oral, resp. rate 20, weight 244 lb (110.678 kg).,  There is no height on file to calculate BMI. General: WNWD AAM. Appears in no acute distress. Neck: Supple. No thyromegaly. I palpated neck but there are no distinct mass or obvious mass. Lungs: Clear bilaterally to auscultation without wheezes, rales, or rhonchi. Breathing is unlabored. Heart: RRR with S1 S2. No murmurs, rubs, or gallops. Abdomen: Soft, non-tender, non-distended with normoactive bowel sounds. No hepatomegaly. No rebound/guarding. No obvious abdominal masses. Neuro: Alert and oriented X 3. Moves all extremities spontaneously. Gait is normal. CNII-XII grossly in tact. EOM intact and normal with no nystagmus. When I had him stick out his tongue, when checking cranial nerve function, his tongue had thick beige coating.  Bilateral arms twitch/jerk occasionally intermittently throughout the visit. Bilateral arms strength is 5 over 5 bilateral grip strength is 5 over 5.  Psych:  Responds to questions appropriately with a normal affect.     ASSESSMENT AND PLAN:  63 y.o. year old male with  1. Unintentional weight loss - CK - CBC with Differential - COMPLETE METABOLIC PANEL WITH GFR - TSH - Helicobacter pylori abs-IgG+IgA, bld - Vitamin B12 - RPR - HIV antibody - CT Soft Tissue Neck W Contrast; Future - CT Head W Wo Contrast; Future  2. Dysphagia - CT Soft Tissue Neck W Contrast; Future - CT Head W Wo Contrast; Future  3. Double vision - CT Soft Tissue Neck W Contrast; Future - CT Head W Wo Contrast; Future  4. Muscle, jerky movements (uncontrolled) - CK - CT Soft Tissue Neck W Contrast; Future - CT Head W Wo Contrast; Future  5. Thrush, oral - nystatin (MYCOSTATIN) 100000 UNIT/ML suspension; Take 5 mLs (500,000 Units total) by mouth 4 (four) times daily. Swish and swallow  Dispense: 60 mL; Refill: 0  6. GERD (gastroesophageal reflux disease) - Helicobacter pylori abs-IgG+IgA, bld - omeprazole (PRILOSEC) 20 MG capsule; Take 1 capsule (20 mg total) by mouth daily.   Dispense: 30 capsule; Refill: 3  7. Personal history of prostate cancer  Obtain labs. Obtain CT of the head and neck as soon as possible. I will then follow up with patient once we obtain the results.   Marin Olp [place], [place], [facility] 06/08/2013 7:36 AM

## 2013-06-08 NOTE — H&P (Signed)
History and Physical       Hospital Admission Note Date: 06/08/2013  Patient name: Curtis Hunt Medical record number: [id number] Date of birth: 1951/03/07 Age: 63 y.o. Gender: male  PCP: Jenna Luo TOM, MD    Chief Complaint:  Hyperglycemia, abnormal labs  HPI: Patient is a 63 year old male with a history of hyperlipidemia, gout was sent to the ER by his PCP after found to have extremely elevated blood 2 readings with no prior history of diabetes. Patient had presented to his PCP with multiple complaints including significant GERD-like symptoms, difficulty eating for last to 3 months. He has been taking Tums for GERD. He also had noted about 25-30 pound weight loss in the last 3 months. The patient also reported having double vision in the last 2-3 weeks, increased thirst and urination for last one month and worsening in the last 2 weeks. Patient's wife at the bedside also noticed jerky movements of the right arm yesterday, today having clumsiness in hands, feeling weakness in his legs. Patient was also noticed to be repeating words during the encounter. Patient's wife also reported that he was having difficulty taking his medicines this morning. During the encounter patient is noted to have significant oral thrush, he was just started on nystatin yesterday. He is no prior history of TIA/CVA.  Review of Systems:  Constitutional: Denies fever, chills, diaphoresis, + poor appetite and fatigue.  HEENT: Denies photophobia, eye pain, redness, hearing loss, ear pain, congestion, please see history of present illness  Respiratory: Denies SOB, DOE, cough, chest tightness,  and wheezing.   Cardiovascular: Denies chest pain, palpitations and leg swelling.  Gastrointestinal: Denies nausea, vomiting, abdominal pain, diarrhea, constipation, blood in stool and abdominal distention.  Genitourinary: Denies dysuria, urgency, frequency, hematuria,  flank pain and difficulty urinating.  Musculoskeletal: Denies myalgias, back pain, joint swelling, arthralgias and gait problem.  Skin: Denies pallor, rash and wound.  Neurological: Please see history of present illness Hematological: Denies adenopathy. Easy bruising, personal or family bleeding history  Psychiatric/Behavioral: Denies suicidal ideation, mood changes, confusion, nervousness, sleep disturbance and agitation  Past Medical History: Past Medical History  Diagnosis Date  . Hyperlipidemia   . Gout    History reviewed. No pertinent past surgical history.  Medications: Prior to Admission medications   Medication Sig Start Date End Date Taking? Authorizing Provider  aspirin 81 MG tablet Take 81 mg by mouth daily.   Yes Historical Provider, MD  Febuxostat 80 MG TABS Take 80 mg by mouth daily. 10/20/12  Yes Susy Frizzle, MD  nystatin (MYCOSTATIN) 100000 UNIT/ML suspension Take 5 mLs by mouth 4 (four) times daily. Swish and swallow 06/07/13  Yes Orlena Sheldon, PA-C  omeprazole (PRILOSEC) 20 MG capsule Take 20 mg by mouth daily. 06/07/13  Yes Mary B Dixon, PA-C  rosuvastatin (CRESTOR) 40 MG tablet Take 40 mg by mouth daily.   Yes Historical Provider, MD  rosuvastatin (CRESTOR) 40 MG tablet TAKE 1 TABLET (40 MG TOTAL) BY MOUTH DAILY. 03/17/13 06/08/13 Yes Susy Frizzle, MD    Allergies:  No Known Allergies  Social History:  reports that he has never smoked. He does not have any smokeless tobacco history on file. He reports that he does not drink alcohol or use illicit drugs. patient lives at home and ambulates by himself.  Family History: No family history on file.  Physical Exam: Blood pressure 115/81, pulse 102, temperature 97.7 F (36.5 C), temperature source Oral, resp. rate 18, height 6\' 1"  (1.854 m),  weight 107.956 kg (238 lb), SpO2 97.00%. General: Alert, awake, oriented but at times repeating words and not able to give out straight answer, in no acute  distress. HEENT: normocephalic, atraumatic, anicteric sclera, pink conjunctiva, pupils equal and reactive to light and accomodation, thick whitish coating on his tongue. Neck: supple, no masses or lymphadenopathy, no goiter, no bruits  Heart: Regular rate and rhythm, without murmurs, rubs or gallops. Lungs: Clear to auscultation bilaterally, no wheezing, rales or rhonchi. Abdomen: Soft, nontender, nondistended, positive bowel sounds, no masses. Extremities: No clubbing, cyanosis or edema with positive pedal pulses. Neuro: Grossly intact, no focal neurological deficits, strength 5/5 upper and lower extremities bilaterally. Bilateral arm twitching/jerking intermittently throughout the encounter, right arm worse than left Psych: alert and oriented x 3, normal mood and affect Skin: no rashes or lesions, warm and dry   LABS on Admission:  Basic Metabolic Panel:  Recent Labs Lab 06/07/13 1125 06/08/13 0930  NA 136 138  K 5.9* 6.0*  CL 88* 89*  CO2 19 16*  GLUCOSE 1254* 1480*  BUN 59* 87*  CREATININE 2.48* 3.04*  CALCIUM 11.1* 10.5   Liver Function Tests:  Recent Labs Lab 06/07/13 1125 06/08/13 0930  AST 15 26  ALT 15 18  ALKPHOS 125* 121*  BILITOT 0.5 0.3  PROT 9.0* 9.1*  ALBUMIN 4.4 3.9   No results found for this basename: LIPASE, AMYLASE,  in the last 168 hours No results found for this basename: AMMONIA,  in the last 168 hours CBC:  Recent Labs Lab 06/07/13 1125 06/08/13 0930  WBC 9.6 12.6*  NEUTROABS 7.3 10.7*  HGB 14.1 14.5  HCT 46.3 48.4  MCV 94.3 96.2  PLT 346 308   Cardiac Enzymes:  Recent Labs Lab 06/07/13 1125  CKTOTAL 501*   BNP: No components found with this basename: POCBNP,  CBG:  Recent Labs Lab 06/08/13 0915 06/08/13 1202  GLUCAP >600* >600*     Radiological Exams on Admission: No results found.  Assessment/Plan Principal Problem:   DKA (diabetic ketoacidoses) with newly diagnosed diabetes mellitus: Strong family history of  diabetes, 3 of his sisters have diabetes, AG 66 - Patient will be placed on DKA protocol, start on IV insulin drip and aggressive IV fluid hydration - Obtain hemoglobin A1c, serial BMET, we'll transition to subcutaneous insulin once BS <180 x4, consecutively, anion gap is closed, HCO3 above 20 - Patient will need diabetic education during hospitalization  Active Problems:   Weight loss, oral thrush, history of prostrate cancer - Obtain CK, aldolase, HIV, PSA  for further workup    Oral thrush with worsening GERD: Possibility of Candida esophagitis - Check HIV, start IV Diflucan, nystatin qid - Placed on IV PPI    Jerking movements of extremities with repeating words: Rule out CVA - Patient is on aspirin 81 mg home, for now continue aspirin 325 mg - Obtain stat MRI of the brain, if positive for stroke, will pursue CVA w/u and neurology consult - Obtain EEG    Acute renal failure: Baseline creatinine unknown, possibly due to acute DKA/dehydration - Continue aggressive IV fluid hydration - If creatinine is not improving, will obtain renal ultrasound for further workup, check serial BMET  DVT prophylaxis: Lovenox  CODE STATUS: Full code  Family Communication: Admission, patients condition and plan of care including tests being ordered have been discussed with the patient and multiple family members  who indicated understanding and agree with the plan and Code Status   Further plan will depend  as patient's clinical course evolves and further radiologic and laboratory data become available.   Time Spent on Admission: 1 hour  RAI,RIPUDEEP M.D. [facility] 06/08/2013, 12:18 PM Pager: [phone]  If 7PM-7AM, please contact night-coverage [email or web address] Password [email or web address]

## 2013-06-08 NOTE — Progress Notes (Signed)
EEG Completed; Results Pending  

## 2013-06-08 NOTE — Procedures (Signed)
ELECTROENCEPHALOGRAM REPORT   Patient: Curtis Hunt       Room #: 4N82 EEG No. ID: [id number] Age: 63 y.o.        Sex: male Referring Physician: Rai Report Date:  06/08/2013        Interpreting Physician: Alexis Goodell D  History: Curtis Hunt is an 63 y.o. male with episodes of difficulty with speech and jerking movements of his right arm  Medications:  Scheduled: . aspirin EC  325 mg Oral Daily   Or  . aspirin  300 mg Rectal Daily  . atorvastatin  80 mg Oral q1800  . enoxaparin (LOVENOX) injection  40 mg Subcutaneous Q24H  . febuxostat  80 mg Oral Daily  . fluconazole (DIFLUCAN) IV  100 mg Intravenous Q24H  . nystatin  5 mL Oral QID  . pantoprazole (PROTONIX) IV  40 mg Intravenous Q12H    Conditions of Recording:  This is a 16 channel EEG carried out with the patient in the drowsy and asleep states.  Description:  The patient is not fully awake during the tracing so wakefulness can not be evaluated.. The patient drowses throughout the majority of the tracing.  During drowse the background activity is slow and poorly organized consisting mostly of a diffusely distributed delta activity.  Some rare theta is noted.  The patient goes in to a light sleep with symmetrical sleep spindles, vertex central sharp transients and irregular slow activity. Two episodes of right arm movements were captured during the tracing.  No change in the background activity was noted.   Hyperventilation was not performed.  Intermittent photic stimulation was performed but failed to illicit any change in the tracing.    IMPRESSION: This is a normal drowsy and asleep EEG.  Two episodes of right arm movements were captured during the tracing.  No epileptiform activity was noted.     Alexis Goodell, MD [facility] [phone] 06/08/2013, 4:58 PM

## 2013-06-08 NOTE — ED Notes (Addendum)
EMS was called by doctor's office for abnormal lab results that were drawn yesterday.  PT is ao x 4 with unstable gait.  CBG  Machine read heigh.  Sinus tach.  PT states he was going to the MD's office today for a CT scan, though he doesn't know what for.  Acetone on breath.  Tongue with green-white exudate.

## 2013-06-08 NOTE — ED Notes (Signed)
MRI called and stated they were able to completed much of the MRI - however, the pt refused to have it completed.  When pt was picked up, he seemed to be slightly confused, not remembering that he had been drinking water in the ED and requesting more.

## 2013-06-08 NOTE — ED Notes (Signed)
Notified RN of CBG HI

## 2013-06-09 ENCOUNTER — Inpatient Hospital Stay (HOSPITAL_COMMUNITY): Payer: 59

## 2013-06-09 DIAGNOSIS — E870 Hyperosmolality and hypernatremia: Secondary | ICD-10-CM | POA: Diagnosis present

## 2013-06-09 DIAGNOSIS — M6282 Rhabdomyolysis: Secondary | ICD-10-CM | POA: Diagnosis present

## 2013-06-09 DIAGNOSIS — E785 Hyperlipidemia, unspecified: Secondary | ICD-10-CM | POA: Diagnosis present

## 2013-06-09 DIAGNOSIS — E860 Dehydration: Secondary | ICD-10-CM | POA: Diagnosis present

## 2013-06-09 DIAGNOSIS — E119 Type 2 diabetes mellitus without complications: Secondary | ICD-10-CM | POA: Diagnosis present

## 2013-06-09 DIAGNOSIS — G9341 Metabolic encephalopathy: Secondary | ICD-10-CM | POA: Diagnosis present

## 2013-06-09 LAB — BASIC METABOLIC PANEL
BUN: 71 mg/dL — AB (ref 6–23)
BUN: 73 mg/dL — ABNORMAL HIGH (ref 6–23)
BUN: 75 mg/dL — ABNORMAL HIGH (ref 6–23)
BUN: 78 mg/dL — ABNORMAL HIGH (ref 6–23)
BUN: 79 mg/dL — ABNORMAL HIGH (ref 6–23)
BUN: 79 mg/dL — ABNORMAL HIGH (ref 6–23)
CALCIUM: 9.7 mg/dL (ref 8.4–10.5)
CALCIUM: 9.4 mg/dL (ref 8.4–10.5)
CHLORIDE: 124 meq/L — AB (ref 96–112)
CO2: 22 mEq/L (ref 19–32)
CO2: 22 mEq/L (ref 19–32)
CO2: 21 mEq/L (ref 19–32)
CO2: 19 mEq/L (ref 19–32)
CO2: 19 mEq/L (ref 19–32)
CO2: 20 mEq/L (ref 19–32)
CREATININE: 2.61 mg/dL — AB (ref 0.50–1.35)
Calcium: 10.3 mg/dL (ref 8.4–10.5)
Calcium: 10.4 mg/dL (ref 8.4–10.5)
Calcium: 10.2 mg/dL (ref 8.4–10.5)
Calcium: 9.4 mg/dL (ref 8.4–10.5)
Chloride: 126 mEq/L — ABNORMAL HIGH (ref 96–112)
Chloride: 126 mEq/L — ABNORMAL HIGH (ref 96–112)
Chloride: 127 mEq/L — ABNORMAL HIGH (ref 96–112)
Chloride: 127 mEq/L — ABNORMAL HIGH (ref 96–112)
Chloride: 129 mEq/L — ABNORMAL HIGH (ref 96–112)
Creatinine, Ser: 2.41 mg/dL — ABNORMAL HIGH (ref 0.50–1.35)
Creatinine, Ser: 2.86 mg/dL — ABNORMAL HIGH (ref 0.50–1.35)
Creatinine, Ser: 3.01 mg/dL — ABNORMAL HIGH (ref 0.50–1.35)
Creatinine, Ser: 3.22 mg/dL — ABNORMAL HIGH (ref 0.50–1.35)
Creatinine, Ser: 3.29 mg/dL — ABNORMAL HIGH (ref 0.50–1.35)
GFR calc Af Amer: 25 mL/min — ABNORMAL LOW (ref >90)
GFR calc Af Amer: 22 mL/min — ABNORMAL LOW (ref >90)
GFR calc non Af Amer: 27 mL/min — ABNORMAL LOW (ref >90)
GFR calc non Af Amer: 24 mL/min — ABNORMAL LOW (ref >90)
GFR calc non Af Amer: 22 mL/min — ABNORMAL LOW (ref >90)
GFR, EST AFRICAN AMERICAN: 31 mL/min — AB (ref >90)
GFR, EST AFRICAN AMERICAN: 28 mL/min — AB (ref >90)
GFR, EST AFRICAN AMERICAN: 24 mL/min — AB (ref >90)
GFR, EST AFRICAN AMERICAN: 21 mL/min — AB (ref >90)
GFR, EST NON AFRICAN AMERICAN: 21 mL/min — AB (ref >90)
GFR, EST NON AFRICAN AMERICAN: 19 mL/min — AB (ref >90)
GFR, EST NON AFRICAN AMERICAN: 19 mL/min — AB (ref >90)
GLUCOSE: 208 mg/dL — AB (ref 70–99)
Glucose, Bld: 381 mg/dL — ABNORMAL HIGH (ref 70–99)
Glucose, Bld: 286 mg/dL — ABNORMAL HIGH (ref 70–99)
Glucose, Bld: 234 mg/dL — ABNORMAL HIGH (ref 70–99)
Glucose, Bld: 290 mg/dL — ABNORMAL HIGH (ref 70–99)
Glucose, Bld: 175 mg/dL — ABNORMAL HIGH (ref 70–99)
POTASSIUM: 4.3 meq/L (ref 3.7–5.3)
POTASSIUM: 4.1 meq/L (ref 3.7–5.3)
POTASSIUM: 4.1 meq/L (ref 3.7–5.3)
POTASSIUM: 4.1 meq/L (ref 3.7–5.3)
POTASSIUM: 3.8 meq/L (ref 3.7–5.3)
Potassium: 4.7 mEq/L (ref 3.7–5.3)
SODIUM: 165 meq/L — AB (ref 137–147)
SODIUM: 162 meq/L — AB (ref 137–147)
Sodium: 164 mEq/L (ref 137–147)
Sodium: 162 mEq/L — ABNORMAL HIGH (ref 137–147)
Sodium: 162 mEq/L — ABNORMAL HIGH (ref 137–147)
Sodium: 165 mEq/L (ref 137–147)

## 2013-06-09 LAB — GLUCOSE, CAPILLARY
GLUCOSE-CAPILLARY: 131 mg/dL — AB (ref 70–99)
GLUCOSE-CAPILLARY: 142 mg/dL — AB (ref 70–99)
GLUCOSE-CAPILLARY: 158 mg/dL — AB (ref 70–99)
GLUCOSE-CAPILLARY: 203 mg/dL — AB (ref 70–99)
GLUCOSE-CAPILLARY: 221 mg/dL — AB (ref 70–99)
GLUCOSE-CAPILLARY: 268 mg/dL — AB (ref 70–99)
GLUCOSE-CAPILLARY: 384 mg/dL — AB (ref 70–99)
GLUCOSE-CAPILLARY: 96 mg/dL (ref 70–99)
Glucose-Capillary: 145 mg/dL — ABNORMAL HIGH (ref 70–99)
Glucose-Capillary: 159 mg/dL — ABNORMAL HIGH (ref 70–99)
Glucose-Capillary: 201 mg/dL — ABNORMAL HIGH (ref 70–99)
Glucose-Capillary: 204 mg/dL — ABNORMAL HIGH (ref 70–99)
Glucose-Capillary: 220 mg/dL — ABNORMAL HIGH (ref 70–99)
Glucose-Capillary: 221 mg/dL — ABNORMAL HIGH (ref 70–99)
Glucose-Capillary: 223 mg/dL — ABNORMAL HIGH (ref 70–99)
Glucose-Capillary: 255 mg/dL — ABNORMAL HIGH (ref 70–99)
Glucose-Capillary: 265 mg/dL — ABNORMAL HIGH (ref 70–99)
Glucose-Capillary: 274 mg/dL — ABNORMAL HIGH (ref 70–99)
Glucose-Capillary: 278 mg/dL — ABNORMAL HIGH (ref 70–99)
Glucose-Capillary: 285 mg/dL — ABNORMAL HIGH (ref 70–99)
Glucose-Capillary: 309 mg/dL — ABNORMAL HIGH (ref 70–99)
Glucose-Capillary: 84 mg/dL (ref 70–99)
Glucose-Capillary: 92 mg/dL (ref 70–99)

## 2013-06-09 LAB — HEMOGLOBIN A1C
Hgb A1c MFr Bld: 15.6 % — ABNORMAL HIGH (ref <5.7)
Mean Plasma Glucose: 401 mg/dL — ABNORMAL HIGH (ref <117)

## 2013-06-09 LAB — HELICOBACTER PYLORI ABS-IGG+IGA, BLD
H Pylori IgG: 2.5 {ISR} — ABNORMAL HIGH
HELICOBACTER PYLORI AB, IGA: 47.2 U/mL — ABNORMAL HIGH (ref <9.0)

## 2013-06-09 LAB — MAGNESIUM: Magnesium: 3 mg/dL — ABNORMAL HIGH (ref 1.5–2.5)

## 2013-06-09 LAB — CK: Total CK: 8970 U/L — ABNORMAL HIGH (ref 7–232)

## 2013-06-09 LAB — PHOSPHORUS: PHOSPHORUS: 1.7 mg/dL — AB (ref 2.3–4.6)

## 2013-06-09 MED ORDER — K PHOS MONO-SOD PHOS DI & MONO 155-852-130 MG PO TABS
250.0000 mg | ORAL_TABLET | Freq: Three times a day (TID) | ORAL | Status: DC
  Administered 2013-06-09 – 2013-06-15 (×17): 250 mg via ORAL
  Filled 2013-06-09 (×21): qty 1

## 2013-06-09 MED ORDER — LIVING WELL WITH DIABETES BOOK
Freq: Once | Status: AC
  Administered 2013-06-09: 11:00:00
  Filled 2013-06-09: qty 1

## 2013-06-09 MED ORDER — SODIUM CHLORIDE 0.9 % IV SOLN
INTRAVENOUS | Status: DC
  Administered 2013-06-09 (×2): via INTRAVENOUS

## 2013-06-09 MED ORDER — DEXTROSE-NACL 5-0.2 % IV SOLN
INTRAVENOUS | Status: DC
  Administered 2013-06-09: 01:00:00 via INTRAVENOUS

## 2013-06-09 NOTE — Progress Notes (Signed)
Pt has critical Sodium 165, K. Baltazar Najjar was notified, order was received and followed, continue to monitor the pt.

## 2013-06-09 NOTE — ED Provider Notes (Signed)
Medical screening examination/treatment/procedure(s) were conducted as a shared visit with non-physician practitioner(s) and myself.  I personally evaluated the patient during the encounter.  EKG Interpretation    Date/Time:    Ventricular Rate:    PR Interval:    QRS Duration:   QT Interval:    QTC Calculation:   R Axis:     Text Interpretation:             Patient new-onset diabetes. Sugar 1400. Patient is awake and able to talk. Will discharge  Jasper Riling. Alvino Chapel, MD 06/09/13 [phone]

## 2013-06-09 NOTE — Clinical Documentation Improvement (Signed)
Possible Clinical Conditions?   Hypernatremia hyponatremia                               Other Condition               Cannot Clinically Determine      Thank You, Alessandra Grout, RN, BSN,CCDS Clinical Documentation Specialist:  [phone]   Cell=[phone] [place] Information Management

## 2013-06-09 NOTE — Progress Notes (Signed)
Inpatient Diabetes Program Recommendations  AACE/ADA: New Consensus Statement on Inpatient Glycemic Control (2013)  Target Ranges:  Prepandial:   less than 140 mg/dL      Peak postprandial:   less than 180 mg/dL (1-2 hours)      Critically ill patients:  140 - 180 mg/dL   Reason for Visit: consult for new onset DM and A1C=15.6  This coordinator met patient in the hall as he was leaving for a CT scan.  Patient gave permission to speak with his sister who was present in the room.  Sister states that patient is confused and not ready to begin bedside education at this time.  Basic inpatient education per bedside RN has been ordered once patient is appropriate.   Inpatient Diabetes Program will continue to follow patient. Thank you  Raoul Pitch BSN, RN,CDE Inpatient Diabetes Coordinator [phone] (team pager)

## 2013-06-09 NOTE — Plan of Care (Signed)
Problem: Food- and Nutrition-Related Knowledge Deficit (NB-1.1) Goal: Nutrition education Formal process to instruct or train a patient/client in a skill or to impart knowledge to help patients/clients voluntarily manage or modify food choices and eating behavior to maintain or improve health. Outcome: Progressing  RD consulted for nutrition education regarding diabetes.     Lab Results  Component Value Date    HGBA1C 15.6* 06/08/2013    RD provided "Carbohydrate Counting for People with Diabetes" handout from the Academy of [employer]etics. Discussed different food groups and their effects on blood sugar, emphasizing carbohydrate-containing foods. Provided list of carbohydrates and recommended serving sizes of common foods.  Discussed importance of controlled and consistent carbohydrate intake throughout the day. Provided examples of ways to balance meals/snacks and encouraged intake of high-fiber, whole grain complex carbohydrates. Teach back method used.  Expect good compliance.  Body mass index is 31.41 kg/(m^2). Pt meets criteria for obese based on current BMI.  Current diet order is clear liquids, patient is consuming approximately sips of meals at this time. Labs and medications reviewed. No further nutrition interventions warranted at this time. RD contact information provided. If additional nutrition issues arise, please re-consult RD.  Brynda Greathouse, MS RD LDN Clinical Inpatient Dietitian Pager: [phone] Weekend/After hours pager: [phone]

## 2013-06-09 NOTE — Progress Notes (Signed)
[facility] Progress Note    Curtis Hunt KNL:[id number] DOB: Apr 07, 1951 DOA: 06/08/2013 PCP: Odette Fraction, MD  Brief narrative: 63 year old male patient with only significant past medical history of dyslipidemia and prostate cancer. Was sent to the ER by his primary care physician after laboratory data obtained after this at all 06/07/2013 revealed extremely elevated serum glucose readings. Patient with no prior history of diabetes. He had been seen the day before by his primary care physician and multiple complaints including significant GERD-like symptoms, difficulty swallowing while eating in a 25-30 pound weight loss in the past 3 months. He also complained of double vision for 2-3 weeks and twitching of his extremities. He did not report any polyuria or polydipsia symptoms during that visit. When he was evaluated in the emergency department he did report problems related to increased thirst and urination for one month it had worsened over the past 2 weeks. In the emergency department his serum glucose was greater than 1400, his CPK was around 2700. His sodium was normal, he was hyperkalemic at 6.0, he also had acute renal failure with a BUN of 87 and a creatinine of 3.04. This had increased since the previous 24 hours when the outpatient labs were obtained at that time his BUN was 59 his creatinine was 2.48 with a serum glucose of 1254. His total CK the day before was only moderately elevated at 501. The admitting physician also determined that the patient has strong family history diabetes. Aggressive IV fluid hydration and insulin drip was started in the emergency department.  Assessment/Plan: Active Problems:   DKA (diabetic ketoacidoses)/Newly diagnosed diabetes -AG still >12 but suspect 2/2 associated ARF -cont insulin gtt until dehydration and ARF resolved -consult diabetes educator -HgbA1c 15.6 so likely will need to use insulin and +/- OHA after discharge -CM to check on  which LA insulin insurance will pay for    Acute renal failure/Rhabdomyolysis -slowly improving with hydration -FU labs including CPK -renal US pending given h/o prostate CA      Weight loss/Dehydration/hypernatremia -once glucose <1000 Na has increased- slow trend down -cont NS @ 150/hr -weight loss primarily volume but possibly some true body mass reduction due to prolonged hyperglycemia -water deficit calculated at 24 L -due to degree of DH have changed maintenance fluids to NS and will make a decision re: transition to dextrose once closer to stopping insulin gtt     Jerking movements of extremities -suspect due to electrolyte disturbance -check Mg and phosphorus and replete if indicated    Metabolic encephalopathy -primarily due to DH,hypernatremia and hyperglycemia -EEG unremarkable re: seizure -MRI neg -if sx's persist after metabolic derangements corrected then add'l neuro WU indicated    Oral thrush -2/2 prolonged hyperglycemia -cont Diflucan and Nystatin S/S    GERD (gastroesophageal reflux disease)    Dyslipidemia -EKG unremarkable   DVT prophylaxis: Lovenox Code Status: Full Family Communication: Multiple family members including sisters at the bedside Disposition Plan/Expected LOS: Step down    Consultants: None  Procedures: Renal ultrasound pending  Antibiotics: None  HPI/Subjective: Patient is awake but still mildly confused according to his sisters. Still complaining of weakness and tremulousness of the extremities. Denies chest pain or shortness of breath now or in the past 6 months.  Objective: Blood pressure 126/88, pulse 118, temperature 99.1 F (37.3 C), temperature source Oral, resp. rate 17, height 6\' 1"  (1.854 m), weight 238 lb (107.956 kg), SpO2 96.00%.  Intake/Output Summary (Last 24 hours) at 06/09/13 1400 Last data  filed at 06/09/13 1300  Gross per 24 hour  Intake 3087.8 ml  Output    800 ml  Net 2287.8 ml      Exam: General: No acute respiratory distress Lungs: Clear to auscultation bilaterally without wheezes or crackles, RA Cardiovascular: Regular tachycardic rate and rhythm without murmur gallop or rub normal S1 and S2, no peripheral edema or JVD Abdomen: Nontender, nondistended, soft, bowel sounds positive, no rebound, no ascites, no appreciable mass Musculoskeletal: No significant cyanosis, clubbing of bilateral lower extremities Neurological: Alert and oriented x at least to name and place, moves all extremities x 4 without focal neurological deficits, CN 2-12 intact, slightly tremulous but no evidence of clonus  Scheduled Meds:  Scheduled Meds: . aspirin EC  325 mg Oral Daily   Or  . aspirin  300 mg Rectal Daily  . atorvastatin  80 mg Oral q1800  . enoxaparin (LOVENOX) injection  40 mg Subcutaneous Q24H  . febuxostat  80 mg Oral Daily  . fluconazole (DIFLUCAN) IV  100 mg Intravenous Q24H  . living well with diabetes book   Does not apply Once  . nystatin  5 mL Oral QID  . pantoprazole (PROTONIX) IV  40 mg Intravenous Q12H   Continuous Infusions: . sodium chloride 200 mL/hr at 06/09/13 0900  . insulin (NOVOLIN-R) infusion 11.1 Units/hr (06/09/13 1200)    **Reviewed in detail by the Attending Physician  Data Reviewed: Basic Metabolic Panel:  Recent Labs Lab 06/08/13 2045 06/08/13 2319 06/09/13 0227 06/09/13 0646 06/09/13 1055  NA 162* 165* 162* 164* 162*  K 4.4 4.3 4.1 4.1 4.7  CL 122* 126* 124* 126* 127*  CO2 22 22 22 21 19   GLUCOSE 491* 381* 286* 208* 234*  BUN 72* 71* 73* 75* 78*  CREATININE 2.41* 2.41* 2.61* 2.86* 3.01*  CALCIUM 10.4 10.3 10.4 10.2 9.7   Liver Function Tests:  Recent Labs Lab 06/07/13 1125 06/08/13 0930  AST 15 26  ALT 15 18  ALKPHOS 125* 121*  BILITOT 0.5 0.3  PROT 9.0* 9.1*  ALBUMIN 4.4 3.9   No results found for this basename: LIPASE, AMYLASE,  in the last 168 hours No results found for this basename: AMMONIA,  in the last  168 hours CBC:  Recent Labs Lab 06/07/13 1125 06/08/13 0930 06/08/13 1600  WBC 9.6 12.6* 13.6*  NEUTROABS 7.3 10.7*  --   HGB 14.1 14.5 15.0  HCT 46.3 48.4 44.8  MCV 94.3 96.2 88.2  PLT 346 308 269   Cardiac Enzymes:  Recent Labs Lab 06/07/13 1125 06/08/13 1600  CKTOTAL 501* 2716*   BNP (last 3 results) No results found for this basename: PROBNP,  in the last 8760 hours CBG:  Recent Labs Lab 06/09/13 0701 06/09/13 0825 06/09/13 0911 06/09/13 1013 06/09/13 1059  GLUCAP 203* 278* 265* 223* 204*    Recent Results (from the past 240 hour(s))  MRSA PCR SCREENING     Status: None   Collection Time    06/08/13  2:23 PM      Result Value Range Status   MRSA by PCR NEGATIVE  NEGATIVE Final   Comment:            The GeneXpert MRSA Assay (FDA     approved for NASAL specimens     only), is one component of a     comprehensive MRSA colonization     surveillance program. It is not     intended to diagnose MRSA     infection nor  to guide or     monitor treatment for     MRSA infections.     Studies:  Recent x-ray studies have been reviewed in detail by the Attending Physician  Time spent :     Erin Hearing, [place] [facility] Office  [phone] Pager [phone]  **If unable to reach the above provider after paging please contact the [place] @ [phone]  On-Call/Text Page:      [email or web address]      password [email or web address]  If 7PM-7AM, please contact night-coverage [email or web address] Password [email or web address] 06/09/2013, 2:00 PM   LOS: 1 day   I have examined the patient, reviewed the chart and modified the above note which I agree with.   Kailen Hinkle,MD [phone] 06/09/2013, 5:00 PM

## 2013-06-09 NOTE — Progress Notes (Signed)
Did orthostatic vital signs. While laying BP was 117/75 (84) and sitting up was 87/69 (73). Pt was told to stand up to get another set of vital signs and once he stood up lost his balance and fell back into bed. Pt felt dizzy and did not want to stand back up. Pt was put back in bed. Pt feels better after getting back and laying down. Will continue to monitor.

## 2013-06-09 NOTE — Progress Notes (Signed)
Mr. Stranahan got confused closer to the morning, tried to get up and pulled his IVs. Pt was reoriented and put back to bed. Insulin drip stopped due to lost of access. IV team was notified, awaiting for their arrival.

## 2013-06-09 NOTE — Progress Notes (Signed)
CRITICAL VALUE ALERT  Critical value received:  Sodium 164  Date of notification:  [id number]  Time of notification:  0730  Critical value read back: Yes  Nurse who received alert:  [place] Jeny Nield RN    MD notified (1st page): Dr. Wynelle Cleveland    Time of first page:  0800  Time MD responded:  [phone]

## 2013-06-09 NOTE — Progress Notes (Signed)
CRITICAL VALUE ALERT  Critical value received:  Sodium 165  Date of notification:  06/09/2013  Time of notification:  1220  Critical value read back:yes  Nurse who received alert:  Kathe Becton  MD notified (1st page):  Tylene Fantasia  Time of first page:  1223  MD notified (2nd page):  Time of second page:  Responding MD:  none  Time MD responded:  waiting

## 2013-06-10 DIAGNOSIS — E860 Dehydration: Secondary | ICD-10-CM

## 2013-06-10 LAB — BASIC METABOLIC PANEL
BUN: 77 mg/dL — ABNORMAL HIGH (ref 6–23)
BUN: 79 mg/dL — ABNORMAL HIGH (ref 6–23)
CO2: 19 mEq/L (ref 19–32)
CO2: 23 mEq/L (ref 19–32)
Calcium: 9.2 mg/dL (ref 8.4–10.5)
Calcium: 9.2 mg/dL (ref 8.4–10.5)
Creatinine, Ser: 3.26 mg/dL — ABNORMAL HIGH (ref 0.50–1.35)
Creatinine, Ser: 3.27 mg/dL — ABNORMAL HIGH (ref 0.50–1.35)
GFR calc Af Amer: 22 mL/min — ABNORMAL LOW (ref >90)
GFR calc non Af Amer: 19 mL/min — ABNORMAL LOW (ref >90)
GFR, EST AFRICAN AMERICAN: 22 mL/min — AB (ref >90)
GFR, EST NON AFRICAN AMERICAN: 19 mL/min — AB (ref >90)
Glucose, Bld: 122 mg/dL — ABNORMAL HIGH (ref 70–99)
Glucose, Bld: 159 mg/dL — ABNORMAL HIGH (ref 70–99)
POTASSIUM: 3.8 meq/L (ref 3.7–5.3)
Potassium: 3.8 mEq/L (ref 3.7–5.3)
SODIUM: 168 meq/L — AB (ref 137–147)
Sodium: 166 mEq/L (ref 137–147)

## 2013-06-10 LAB — GLUCOSE, CAPILLARY
GLUCOSE-CAPILLARY: 165 mg/dL — AB (ref 70–99)
GLUCOSE-CAPILLARY: 295 mg/dL — AB (ref 70–99)
Glucose-Capillary: 100 mg/dL — ABNORMAL HIGH (ref 70–99)
Glucose-Capillary: 119 mg/dL — ABNORMAL HIGH (ref 70–99)
Glucose-Capillary: 124 mg/dL — ABNORMAL HIGH (ref 70–99)
Glucose-Capillary: 129 mg/dL — ABNORMAL HIGH (ref 70–99)
Glucose-Capillary: 140 mg/dL — ABNORMAL HIGH (ref 70–99)
Glucose-Capillary: 141 mg/dL — ABNORMAL HIGH (ref 70–99)
Glucose-Capillary: 166 mg/dL — ABNORMAL HIGH (ref 70–99)
Glucose-Capillary: 177 mg/dL — ABNORMAL HIGH (ref 70–99)
Glucose-Capillary: 220 mg/dL — ABNORMAL HIGH (ref 70–99)
Glucose-Capillary: 293 mg/dL — ABNORMAL HIGH (ref 70–99)
Glucose-Capillary: 303 mg/dL — ABNORMAL HIGH (ref 70–99)
Glucose-Capillary: 359 mg/dL — ABNORMAL HIGH (ref 70–99)
Glucose-Capillary: 373 mg/dL — ABNORMAL HIGH (ref 70–99)

## 2013-06-10 LAB — ALDOLASE: Aldolase: 12.7 U/L — ABNORMAL HIGH (ref <=8.1)

## 2013-06-10 LAB — RAPID URINE DRUG SCREEN, HOSP PERFORMED
Amphetamines: NOT DETECTED
BARBITURATES: NOT DETECTED
Benzodiazepines: NOT DETECTED
COCAINE: NOT DETECTED
Opiates: NOT DETECTED
Tetrahydrocannabinol: NOT DETECTED

## 2013-06-10 LAB — PHOSPHORUS: PHOSPHORUS: 2.7 mg/dL (ref 2.3–4.6)

## 2013-06-10 LAB — CK: Total CK: 6086 U/L — ABNORMAL HIGH (ref 7–232)

## 2013-06-10 MED ORDER — INSULIN DETEMIR 100 UNIT/ML ~~LOC~~ SOLN
25.0000 [IU] | Freq: Every day | SUBCUTANEOUS | Status: DC
  Administered 2013-06-10: 25 [IU] via SUBCUTANEOUS
  Filled 2013-06-10: qty 0.25

## 2013-06-10 MED ORDER — INSULIN ASPART 100 UNIT/ML ~~LOC~~ SOLN
0.0000 [IU] | Freq: Every day | SUBCUTANEOUS | Status: DC
  Administered 2013-06-10: 3 [IU] via SUBCUTANEOUS
  Administered 2013-06-11: 5 [IU] via SUBCUTANEOUS
  Administered 2013-06-12: 3 [IU] via SUBCUTANEOUS

## 2013-06-10 MED ORDER — ASPIRIN EC 81 MG PO TBEC
81.0000 mg | DELAYED_RELEASE_TABLET | Freq: Every day | ORAL | Status: DC
  Administered 2013-06-10 – 2013-06-15 (×6): 81 mg via ORAL
  Filled 2013-06-10 (×6): qty 1

## 2013-06-10 MED ORDER — SODIUM CHLORIDE 0.45 % IV SOLN
INTRAVENOUS | Status: DC
  Administered 2013-06-10 (×2): 150 mL/h via INTRAVENOUS
  Administered 2013-06-11 – 2013-06-14 (×6): via INTRAVENOUS

## 2013-06-10 MED ORDER — PANTOPRAZOLE SODIUM 40 MG PO TBEC
40.0000 mg | DELAYED_RELEASE_TABLET | Freq: Every day | ORAL | Status: DC
  Administered 2013-06-10 – 2013-06-15 (×6): 40 mg via ORAL
  Filled 2013-06-10 (×6): qty 1

## 2013-06-10 MED ORDER — DEXTROSE-NACL 5-0.45 % IV SOLN
INTRAVENOUS | Status: DC
  Administered 2013-06-10: 03:00:00 via INTRAVENOUS

## 2013-06-10 MED ORDER — INSULIN ASPART 100 UNIT/ML ~~LOC~~ SOLN
0.0000 [IU] | Freq: Three times a day (TID) | SUBCUTANEOUS | Status: DC
  Administered 2013-06-10: 15 [IU] via SUBCUTANEOUS
  Administered 2013-06-10: 8 [IU] via SUBCUTANEOUS

## 2013-06-10 MED ORDER — INSULIN DETEMIR 100 UNIT/ML ~~LOC~~ SOLN
25.0000 [IU] | Freq: Two times a day (BID) | SUBCUTANEOUS | Status: DC
  Administered 2013-06-10: 25 [IU] via SUBCUTANEOUS
  Filled 2013-06-10 (×2): qty 0.25

## 2013-06-10 MED ORDER — WHITE PETROLATUM GEL
Status: AC
  Administered 2013-06-10: 0.2
  Filled 2013-06-10: qty 5

## 2013-06-10 NOTE — Progress Notes (Signed)
CRITICAL VALUE ALERT  Critical value received:  Na 168, Cl >130  Date of notification:  06/10/2013  Time of notification:  0505  Critical value read back:yes  Nurse who received alert:  Warner Mccreedy  MD notified (1st page):  Donnal Debar, [place]  Time of first page:  [phone]

## 2013-06-10 NOTE — Progress Notes (Signed)
CRITICAL VALUE ALERT  Critical value received:  Na 166, Cl >130  Date of notification:  06/10/2013  Time of notification:  1:16 AM  Critical value read back:yes  Nurse who received alert:  Warner Mccreedy  MD notified (1st page):  Donnal Debar, [place]  Time of first page:  0115  Responding MD:  Donnal Debar, [place]  Time MD responded:  0200

## 2013-06-10 NOTE — Progress Notes (Addendum)
[facility] Progress Note    Curtis Hunt IRJ:[id number] DOB: July 08, 1950 DOA: 06/08/2013 PCP: Odette Fraction, MD  Brief narrative: 63 year old male patient with only significant past medical history of dyslipidemia and prostate cancer. Was sent to the ER by his primary care physician after laboratory data obtained after this at all 06/07/2013 revealed extremely elevated serum glucose readings. Patient with no prior history of diabetes. He had been seen the day before by his primary care physician and multiple complaints including significant GERD-like symptoms, difficulty swallowing while eating in a 25-30 pound weight loss in the past 3 months. He also complained of double vision for 2-3 weeks and twitching of his extremities. He did not report any polyuria or polydipsia symptoms during that visit. When he was evaluated in the emergency department he did report problems related to increased thirst and urination for one month it had worsened over the past 2 weeks. In the emergency department his serum glucose was greater than 1400, his CPK was around 2700. His sodium was normal, he was hyperkalemic at 6.0, he also had acute renal failure with a BUN of 87 and a creatinine of 3.04. This had increased since the previous 24 hours when the outpatient labs were obtained at that time his BUN was 59 his creatinine was 2.48 with a serum glucose of 1254. His total CK the day before was only moderately elevated at 501. The admitting physician also determined that the patient has strong family history diabetes. Aggressive IV fluid hydration and insulin drip was started in the emergency department.  Assessment/Plan: Active Problems:   DKA (diabetic ketoacidoses)/Newly diagnosed diabetes - switch from insulin infusion to s/c insulin today - consult diabetes educator - start teaching for insulin injections    Acute renal failure/Rhabdomyolysis -slowly improving with hydration -FU labs including  CPK -renal US reveals "dromedary hump" vs mass- triphasic CT recommended    Weight loss/Dehydration/hypernatremia -cont 1/5 NS @ 150/hr -water deficit calculated at 24 L    Jerking movements of extremities -suspect due to electrolyte disturbance repletion check Mg and phosphorus and replete if indicated    Metabolic encephalopathy - resolved -primarily due to DH,hypernatremia and hyperglycemia -EEG unremarkable re: seizure -MRI neg    Oral thrush -2/2 prolonged hyperglycemia -d/c Diflucan and and Nystatin S/S    GERD (gastroesophageal reflux disease)    Dyslipidemia -EKG unremarkable   DVT prophylaxis: Lovenox Code Status: Full Family Communication: none Disposition Plan/Expected LOS: Step down    Consultants: None  Procedures: none  Antibiotics: None  HPI/Subjective: Patient is awake- no complaints.   Objective: Blood pressure 97/69, pulse 118, temperature 98.5 F (36.9 C), temperature source Oral, resp. rate 17, height 6\' 1"  (1.854 m), weight 107.956 kg (238 lb), SpO2 95.00%.  Intake/Output Summary (Last 24 hours) at 06/10/13 1530 Last data filed at 06/10/13 1018  Gross per 24 hour  Intake 3990.15 ml  Output    380 ml  Net 3610.15 ml     Exam: General: No acute respiratory distress Lungs: Clear to auscultation bilaterally without wheezes or crackles, RA Cardiovascular: Regular tachycardic rate and rhythm without murmur gallop or rub normal S1 and S2, no peripheral edema or JVD Abdomen: Nontender, nondistended, soft, bowel sounds positive, no rebound, no ascites, no appreciable mass Musculoskeletal: No significant cyanosis, clubbing of bilateral lower extremities Neurological: Alert and oriented x at least to name and place, moves all extremities x 4 without focal neurological deficits, CN 2-12 intact, slightly tremulous but no evidence of clonus  Scheduled  Meds:  Scheduled Meds: . aspirin EC  81 mg Oral Daily  . atorvastatin  80 mg Oral q1800  .  enoxaparin (LOVENOX) injection  40 mg Subcutaneous Q24H  . febuxostat  80 mg Oral Daily  . insulin aspart  0-15 Units Subcutaneous TID WC  . insulin aspart  0-5 Units Subcutaneous QHS  . insulin detemir  25 Units Subcutaneous Daily  . nystatin  5 mL Oral QID  . pantoprazole  40 mg Oral Daily  . phosphorus  250 mg Oral TID   Continuous Infusions: . sodium chloride 150 mL/hr (06/10/13 0920)  . insulin (NOVOLIN-R) infusion Stopped (06/10/13 1111)    **Reviewed in detail by the Attending Physician  Data Reviewed: Basic Metabolic Panel:  Recent Labs Lab 06/09/13 1055 06/09/13 1455 06/09/13 1859 06/09/13 2333 06/10/13 0341 06/10/13 0925  NA 162* 162* 165* 166* 168*  --   K 4.7 4.1 3.8 3.8 3.8  --   CL 127* 127* 129* >130* >130*  --   CO2 19 19 20 19 23   --   GLUCOSE 234* 290* 175* 159* 122*  --   BUN 78* 79* 79* 79* 77*  --   CREATININE 3.01* 3.22* 3.29* 3.27* 3.26*  --   CALCIUM 9.7 9.4 9.4 9.2 9.2  --   MG  --  3.0*  --   --   --   --   PHOS  --  1.7*  --   --   --  2.7   Liver Function Tests:  Recent Labs Lab 06/07/13 1125 06/08/13 0930  AST 15 26  ALT 15 18  ALKPHOS 125* 121*  BILITOT 0.5 0.3  PROT 9.0* 9.1*  ALBUMIN 4.4 3.9   No results found for this basename: LIPASE, AMYLASE,  in the last 168 hours No results found for this basename: AMMONIA,  in the last 168 hours CBC:  Recent Labs Lab 06/07/13 1125 06/08/13 0930 06/08/13 1600  WBC 9.6 12.6* 13.6*  NEUTROABS 7.3 10.7*  --   HGB 14.1 14.5 15.0  HCT 46.3 48.4 44.8  MCV 94.3 96.2 88.2  PLT 346 308 269   Cardiac Enzymes:  Recent Labs Lab 06/07/13 1125 06/08/13 1600 06/09/13 1455 06/10/13 0925  CKTOTAL 501* 2716* 8970* 6086*   BNP (last 3 results) No results found for this basename: PROBNP,  in the last 8760 hours CBG:  Recent Labs Lab 06/10/13 0717 06/10/13 0820 06/10/13 0915 06/10/13 1016 06/10/13 1106  GLUCAP 177* 220* 303* 359* 293*    Recent Results (from the past 240  hour(s))  MRSA PCR SCREENING     Status: None   Collection Time    06/08/13  2:23 PM      Result Value Range Status   MRSA by PCR NEGATIVE  NEGATIVE Final   Comment:            The GeneXpert MRSA Assay (FDA     approved for NASAL specimens     only), is one component of a     comprehensive MRSA colonization     surveillance program. It is not     intended to diagnose MRSA     infection nor to guide or     monitor treatment for     MRSA infections.     Studies:  Recent x-ray studies have been reviewed in detail by the Attending Physician  Time spent : 30 min    Zain Bingman,MD [phone] 06/10/2013, 3:30 PM

## 2013-06-11 ENCOUNTER — Encounter (HOSPITAL_COMMUNITY): Payer: Self-pay | Admitting: Neurology

## 2013-06-11 DIAGNOSIS — E870 Hyperosmolality and hypernatremia: Secondary | ICD-10-CM

## 2013-06-11 LAB — BASIC METABOLIC PANEL
BUN: 60 mg/dL — ABNORMAL HIGH (ref 6–23)
CHLORIDE: 116 meq/L — AB (ref 96–112)
CO2: 21 meq/L (ref 19–32)
Calcium: 7.8 mg/dL — ABNORMAL LOW (ref 8.4–10.5)
Creatinine, Ser: 2.24 mg/dL — ABNORMAL HIGH (ref 0.50–1.35)
GFR calc Af Amer: 34 mL/min — ABNORMAL LOW (ref >90)
GFR calc non Af Amer: 29 mL/min — ABNORMAL LOW (ref >90)
Glucose, Bld: 362 mg/dL — ABNORMAL HIGH (ref 70–99)
POTASSIUM: 4.4 meq/L (ref 3.7–5.3)
Sodium: 150 mEq/L — ABNORMAL HIGH (ref 137–147)

## 2013-06-11 LAB — GLUCOSE, CAPILLARY
GLUCOSE-CAPILLARY: 400 mg/dL — AB (ref 70–99)
GLUCOSE-CAPILLARY: 369 mg/dL — AB (ref 70–99)
GLUCOSE-CAPILLARY: 363 mg/dL — AB (ref 70–99)
Glucose-Capillary: 311 mg/dL — ABNORMAL HIGH (ref 70–99)

## 2013-06-11 MED ORDER — INSULIN ASPART 100 UNIT/ML ~~LOC~~ SOLN: 0.0000 [IU] | Freq: Every day | SUBCUTANEOUS | Status: DC

## 2013-06-11 MED ORDER — INSULIN ASPART 100 UNIT/ML ~~LOC~~ SOLN
0.0000 [IU] | Freq: Three times a day (TID) | SUBCUTANEOUS | Status: DC
  Administered 2013-06-11 (×2): 9 [IU] via SUBCUTANEOUS
  Administered 2013-06-12: 5 [IU] via SUBCUTANEOUS
  Administered 2013-06-12: 3 [IU] via SUBCUTANEOUS
  Administered 2013-06-12: 5 [IU] via SUBCUTANEOUS
  Administered 2013-06-13: 9 [IU] via SUBCUTANEOUS
  Administered 2013-06-13 (×2): 3 [IU] via SUBCUTANEOUS
  Administered 2013-06-14: 9 [IU] via SUBCUTANEOUS
  Administered 2013-06-14: 7 [IU] via SUBCUTANEOUS
  Administered 2013-06-14: 3 [IU] via SUBCUTANEOUS
  Administered 2013-06-15: 2 [IU] via SUBCUTANEOUS
  Administered 2013-06-15: 5 [IU] via SUBCUTANEOUS

## 2013-06-11 MED ORDER — INSULIN DETEMIR 100 UNIT/ML ~~LOC~~ SOLN
35.0000 [IU] | Freq: Two times a day (BID) | SUBCUTANEOUS | Status: DC
  Filled 2013-06-11 (×2): qty 0.35

## 2013-06-11 MED ORDER — INSULIN DETEMIR 100 UNIT/ML ~~LOC~~ SOLN
35.0000 [IU] | Freq: Two times a day (BID) | SUBCUTANEOUS | Status: DC
  Administered 2013-06-11 – 2013-06-12 (×3): 35 [IU] via SUBCUTANEOUS
  Filled 2013-06-11 (×4): qty 0.35

## 2013-06-11 MED ORDER — INSULIN ASPART 100 UNIT/ML ~~LOC~~ SOLN
10.0000 [IU] | Freq: Three times a day (TID) | SUBCUTANEOUS | Status: DC
  Administered 2013-06-11 – 2013-06-12 (×6): 10 [IU] via SUBCUTANEOUS

## 2013-06-11 MED ORDER — INSULIN DETEMIR 100 UNIT/ML ~~LOC~~ SOLN: 4035.0000 [IU] | Freq: Two times a day (BID) | SUBCUTANEOUS | Status: DC

## 2013-06-11 NOTE — Progress Notes (Signed)
[facility] Progress Note    Curtis Hunt KKX:[id number] DOB: Apr 25, 1951 DOA: 06/08/2013 PCP: Odette Fraction, MD  Brief narrative: 63 year old male patient with only significant past medical history of dyslipidemia and prostate cancer. Was sent to the ER by his primary care physician after laboratory data obtained after this at all 06/07/2013 revealed extremely elevated serum glucose readings. Patient with no prior history of diabetes. He had been seen the day before by his primary care physician and multiple complaints including significant GERD-like symptoms, difficulty swallowing while eating in a 25-30 pound weight loss in the past 3 months. He also complained of double vision for 2-3 weeks and twitching of his extremities. He did not report any polyuria or polydipsia symptoms during that visit. When he was evaluated in the emergency department he did report problems related to increased thirst and urination for one month it had worsened over the past 2 weeks. In the emergency department his serum glucose was greater than 1400, his CPK was around 2700. His sodium was normal, he was hyperkalemic at 6.0, he also had acute renal failure with a BUN of 87 and a creatinine of 3.04. This had increased since the previous 24 hours when the outpatient labs were obtained at that time his BUN was 59 his creatinine was 2.48 with a serum glucose of 1254. His total CK the day before was only moderately elevated at 501. The admitting physician also determined that the patient has strong family history diabetes. Aggressive IV fluid hydration and insulin drip was started in the emergency department.  Assessment/Plan: Active Problems:   DKA (diabetic ketoacidoses)/Newly diagnosed diabetes - switch from insulin infusion to s/c insulin - sugars still quite elevated-  titrating up insulin - consult diabetes educator - start teaching for insulin injections    Acute renal failure/Rhabdomyolysis -slowly  improving with hydration -FU labs including CPK -renal US reveals "dromedary hump" vs mass- triphasic CT recommended    Weight loss/Dehydration/hypernatremia -cont 1/5 NS @ 150/hr -water deficit calculated at 24 L    Jerking movements of extremities -suspect due to electrolyte disturbance repletion check Mg and phosphorus and replete if indicated    Metabolic encephalopathy - resolved -primarily due to DH,hypernatremia and hyperglycemia -EEG unremarkable re: seizure -MRI neg    Oral thrush -2/2 prolonged hyperglycemia -d/c Diflucan and and Nystatin S/S    GERD (gastroesophageal reflux disease)   DVT prophylaxis: Lovenox Code Status: Full Family Communication: none Disposition Plan/Expected LOS: transfer to med-surg   Consultants: None  Procedures: none  Antibiotics: None  HPI/Subjective: Patient is awake- no complaints.   Objective: Blood pressure 113/76, pulse 91, temperature 97.8 F (36.6 C), temperature source Oral, resp. rate 18, height 6\' 1"  (1.854 m), weight 107.956 kg (238 lb), SpO2 98.00%.  Intake/Output Summary (Last 24 hours) at 06/11/13 1407 Last data filed at 06/11/13 1300  Gross per 24 hour  Intake 4840.08 ml  Output   3075 ml  Net 1765.08 ml     Exam: General: No acute respiratory distress Lungs: Clear to auscultation bilaterally without wheezes or crackles, RA Cardiovascular: Regular tachycardic rate and rhythm without murmur gallop or rub normal S1 and S2, no peripheral edema or JVD Abdomen: Nontender, nondistended, soft, bowel sounds positive, no rebound, no ascites, no appreciable mass Musculoskeletal: No significant cyanosis, clubbing of bilateral lower extremities Neurological: Alert and oriented x at least to name and place, moves all extremities x 4 without focal neurological deficits, CN 2-12 intact, slightly tremulous but no evidence of clonus  Scheduled Meds:  Scheduled Meds: . aspirin EC  81 mg Oral Daily  . atorvastatin  80  mg Oral q1800  . enoxaparin (LOVENOX) injection  40 mg Subcutaneous Q24H  . febuxostat  80 mg Oral Daily  . insulin aspart  0-5 Units Subcutaneous QHS  . insulin aspart  0-9 Units Subcutaneous TID WC  . insulin aspart  10 Units Subcutaneous TID WC  . insulin detemir  35 Units Subcutaneous BID  . nystatin  5 mL Oral QID  . pantoprazole  40 mg Oral Daily  . phosphorus  250 mg Oral TID   Continuous Infusions: . sodium chloride 75 mL/hr (06/11/13 1131)  . insulin (NOVOLIN-R) infusion Stopped (06/10/13 1111)    **Reviewed in detail by the Attending Physician  Data Reviewed: Basic Metabolic Panel:  Recent Labs Lab 06/09/13 1055 06/09/13 1455 06/09/13 1859 06/09/13 2333 06/10/13 0341 06/10/13 0925 06/11/13 0330  NA 162* 162* 165* 166* 168*  --  150*  K 4.7 4.1 3.8 3.8 3.8  --  4.4  CL 127* 127* 129* >130* >130*  --  116*  CO2 19 19 20 19 23   --  21  GLUCOSE 234* 290* 175* 159* 122*  --  362*  BUN 78* 79* 79* 79* 77*  --  60*  CREATININE 3.01* 3.22* 3.29* 3.27* 3.26*  --  2.24*  CALCIUM 9.7 9.4 9.4 9.2 9.2  --  7.8*  MG  --  3.0*  --   --   --   --   --   PHOS  --  1.7*  --   --   --  2.7  --    Liver Function Tests:  Recent Labs Lab 06/07/13 1125 06/08/13 0930  AST 15 26  ALT 15 18  ALKPHOS 125* 121*  BILITOT 0.5 0.3  PROT 9.0* 9.1*  ALBUMIN 4.4 3.9   No results found for this basename: LIPASE, AMYLASE,  in the last 168 hours No results found for this basename: AMMONIA,  in the last 168 hours CBC:  Recent Labs Lab 06/07/13 1125 06/08/13 0930 06/08/13 1600  WBC 9.6 12.6* 13.6*  NEUTROABS 7.3 10.7*  --   HGB 14.1 14.5 15.0  HCT 46.3 48.4 44.8  MCV 94.3 96.2 88.2  PLT 346 308 269   Cardiac Enzymes:  Recent Labs Lab 06/07/13 1125 06/08/13 1600 06/09/13 1455 06/10/13 0925  CKTOTAL 501* 2716* 8970* 6086*   BNP (last 3 results) No results found for this basename: PROBNP,  in the last 8760 hours CBG:  Recent Labs Lab 06/10/13 1106 06/10/13 1701  06/10/13 2135 06/11/13 0726 06/11/13 1121  GLUCAP 293* 373* 295* 311* 400*    Recent Results (from the past 240 hour(s))  MRSA PCR SCREENING     Status: None   Collection Time    06/08/13  2:23 PM      Result Value Range Status   MRSA by PCR NEGATIVE  NEGATIVE Final   Comment:            The GeneXpert MRSA Assay (FDA     approved for NASAL specimens     only), is one component of a     comprehensive MRSA colonization     surveillance program. It is not     intended to diagnose MRSA     infection nor to guide or     monitor treatment for     MRSA infections.     Studies:  Recent x-ray studies have been reviewed  in detail by the Attending Physician  Time spent : 30 min    Dareth Andrew,MD [phone] 06/11/2013, 2:07 PM

## 2013-06-12 LAB — BASIC METABOLIC PANEL
BUN: 40 mg/dL — ABNORMAL HIGH (ref 6–23)
CO2: 22 mEq/L (ref 19–32)
Calcium: 7.7 mg/dL — ABNORMAL LOW (ref 8.4–10.5)
Chloride: 113 mEq/L — ABNORMAL HIGH (ref 96–112)
Creatinine, Ser: 1.69 mg/dL — ABNORMAL HIGH (ref 0.50–1.35)
GFR calc non Af Amer: 41 mL/min — ABNORMAL LOW (ref >90)
GFR, EST AFRICAN AMERICAN: 48 mL/min — AB (ref >90)
Glucose, Bld: 285 mg/dL — ABNORMAL HIGH (ref 70–99)
POTASSIUM: 4 meq/L (ref 3.7–5.3)
SODIUM: 147 meq/L (ref 137–147)

## 2013-06-12 LAB — CK: CK TOTAL: 1358 U/L — AB (ref 7–232)

## 2013-06-12 LAB — GLUCOSE, CAPILLARY
GLUCOSE-CAPILLARY: 325 mg/dL — AB (ref 70–99)
GLUCOSE-CAPILLARY: 298 mg/dL — AB (ref 70–99)
Glucose-Capillary: 212 mg/dL — ABNORMAL HIGH (ref 70–99)
Glucose-Capillary: 300 mg/dL — ABNORMAL HIGH (ref 70–99)
Glucose-Capillary: 256 mg/dL — ABNORMAL HIGH (ref 70–99)

## 2013-06-12 MED ORDER — INSULIN PEN STARTER KIT
1.0000 | Freq: Once | Status: AC
  Administered 2013-06-12: 1
  Filled 2013-06-12: qty 1

## 2013-06-12 MED ORDER — INSULIN ASPART 100 UNIT/ML ~~LOC~~ SOLN
12.0000 [IU] | Freq: Three times a day (TID) | SUBCUTANEOUS | Status: DC
  Administered 2013-06-13 – 2013-06-14 (×4): 12 [IU] via SUBCUTANEOUS

## 2013-06-12 MED ORDER — LIVING WELL WITH DIABETES BOOK
Freq: Once | Status: AC
  Administered 2013-06-12: 15:00:00
  Filled 2013-06-12: qty 1

## 2013-06-12 MED ORDER — INSULIN DETEMIR 100 UNIT/ML ~~LOC~~ SOLN
45.0000 [IU] | Freq: Two times a day (BID) | SUBCUTANEOUS | Status: DC
  Administered 2013-06-12: 45 [IU] via SUBCUTANEOUS
  Filled 2013-06-12 (×3): qty 0.45

## 2013-06-12 NOTE — Progress Notes (Signed)
Patient transferred to 71E25.  Patient oriented to unit and room.  Patient educated on use of call bell, telephone, and how to call the nurse from room phone.  No telemetry per MD.  Vitals obtained.  Patient assessed.  Patient escorted to chair, RN noted steady gait.  No chair alarm placed.  Patient resting comfortably in room with sister, no complaints at this time.

## 2013-06-12 NOTE — Progress Notes (Signed)
Diabetic education done. Patient given handouts for reading. Pt verbalized understanding. Pt gave self injection and tolerated well this am.

## 2013-06-12 NOTE — Progress Notes (Signed)
Inpatient Diabetes Program Recommendations  AACE/ADA: New Consensus Statement on Inpatient Glycemic Control (2013)  Target Ranges:  Prepandial:   less than 140 mg/dL      Peak postprandial:   less than 180 mg/dL (1-2 hours)      Critically ill patients:  140 - 180 mg/dL   Referral received for "new onset DM" today. Pt was seen last week for ed'l consult.  All education has been ordered. Recommend now an order for OP education Will see patient again before discharge.  Thank you, Rosita Kea, RN, CNS, Diabetes Coordinator [phone])

## 2013-06-12 NOTE — Progress Notes (Signed)
[facility] [place] TEAM 1 - Stepdown/ICU TEAM  Curtis LIWANAG ZOX:[id number] DOB: 02-Dec-1950 DOA: 06/08/2013 PCP: Odette Fraction, MD  Brief narrative: 63 year old male patient with only significant past medical history of dyslipidemia and prostate cancer. Was sent to the ER by his primary care physician after laboratory data revealed extremely elevated serum glucose readings.   n the emergency department his serum glucose was greater than 1400, his CPK was around 2700. His sodium was normal, he was hyperkalemic at 6.0, he also had acute renal failure with a BUN of 87 and a creatinine of 3.04.   Assessment/Plan:  DKA in Newly diagnosed diabetes - switched from insulin infusion to s/c insulin 1/18 - sugars remain elevated-  titrating up insulin further to 45 units - consult diabetes educator - start teaching for insulin injections  Acute renal failure -slowly improving with hydration - cont IVFs at 75/hr -renal US reveals "dromedary hump" vs mass left kidney- triphasic CT recommended - given renal function need to pursue once renal function stabilized (possibly as outpt)  Rhabdomyolysis -Continue to hydrate and follow CK in a.m.  Weight loss/Dehydration/hypernatremia -Initial water deficit calculated at 24 L -obtain daily weights and recalculate deficit daily so can adjust fluids  Jerking movements of extremities -suspect due to electrolyte disturbance - repleted phosphorus initially since was low-phos 2.7 after replete - Mg was 3.0  Metabolic encephalopathy - resolved -primarily due to DH,hypernatremia and hyperglycemia -EEG unremarkable re: seizure -MRI neg  Oral thrush -2/2 prolonged hyperglycemia -Nystatin S/S  GERD (gastroesophageal reflux disease)  DVT prophylaxis: Lovenox Code Status: Full Family Communication: none Disposition Plan/Expected LOS: transfer to  med-surg  Consultants: None  Procedures: none  Antibiotics: None  HPI/Subjective: Patient is awake- no complaints.  Denies chest pain fevers chills nausea vomiting or abdominal pain.  Objective: Blood pressure 133/70, pulse 74, temperature 97.9 F (36.6 C), temperature source Oral, resp. rate 20, height 6\' 1"  (1.854 m), weight 238 lb (107.956 kg), SpO2 97.00%.  Intake/Output Summary (Last 24 hours) at 06/12/13 1342 Last data filed at 06/12/13 0900  Gross per 24 hour  Intake 3168.75 ml  Output   1700 ml  Net 1468.75 ml   Exam: General: No acute respiratory distress Lungs: Clear to auscultation bilaterally without wheezes or crackles, RA Cardiovascular: Regular tachycardic rate and rhythm without murmur gallop or rub normal S1 and S2, no peripheral edema or JVD Abdomen: Nontender, nondistended, soft, bowel sounds positive, no rebound, no ascites, no appreciable mass Musculoskeletal: No significant cyanosis, clubbing of bilateral lower extremities Neurological: Alert and oriented x at least to name and place, moves all extremities x 4 without focal neurological deficits, CN 2-12 intact, slightly tremulous but no evidence of clonus  Scheduled Meds:  Scheduled Meds: . aspirin EC  81 mg Oral Daily  . atorvastatin  80 mg Oral q1800  . enoxaparin (LOVENOX) injection  40 mg Subcutaneous Q24H  . febuxostat  80 mg Oral Daily  . insulin aspart  0-5 Units Subcutaneous QHS  . insulin aspart  0-9 Units Subcutaneous TID WC  . insulin aspart  10 Units Subcutaneous TID WC  . insulin detemir  45 Units Subcutaneous BID  . nystatin  5 mL Oral QID  . pantoprazole  40 mg Oral Daily  . phosphorus  250 mg Oral TID    Data Reviewed: Basic Metabolic Panel:  Recent Labs Lab 06/09/13 1055 06/09/13 1455 06/09/13 1859 06/09/13 2333 06/10/13 0341 06/10/13 0925 06/11/13 0330 06/12/13 0325  NA 162* 162* 165* 166*  168*  --  150* 147  K 4.7 4.1 3.8 3.8 3.8  --  4.4 4.0  CL 127* 127* 129*  >130* >130*  --  116* 113*  CO2 19 19 20 19 23   --  21 22  GLUCOSE 234* 290* 175* 159* 122*  --  362* 285*  BUN 78* 79* 79* 79* 77*  --  60* 40*  CREATININE 3.01* 3.22* 3.29* 3.27* 3.26*  --  2.24* 1.69*  CALCIUM 9.7 9.4 9.4 9.2 9.2  --  7.8* 7.7*  MG  --  3.0*  --   --   --   --   --   --   PHOS  --  1.7*  --   --   --  2.7  --   --    Liver Function Tests:  Recent Labs Lab 06/07/13 1125 06/08/13 0930  AST 15 26  ALT 15 18  ALKPHOS 125* 121*  BILITOT 0.5 0.3  PROT 9.0* 9.1*  ALBUMIN 4.4 3.9   CBC:  Recent Labs Lab 06/07/13 1125 06/08/13 0930 06/08/13 1600  WBC 9.6 12.6* 13.6*  NEUTROABS 7.3 10.7*  --   HGB 14.1 14.5 15.0  HCT 46.3 48.4 44.8  MCV 94.3 96.2 88.2  PLT 346 308 269   Cardiac Enzymes:  Recent Labs Lab 06/07/13 1125 06/08/13 1600 06/09/13 1455 06/10/13 0925 06/12/13 0325  CKTOTAL 501* 2716* 8970* 6086* 1358*   CBG:  Recent Labs Lab 06/11/13 1121 06/11/13 1726 06/11/13 2217 06/12/13 0801 06/12/13 1134  GLUCAP 400* 369* 363* 212* 300*    Recent Results (from the past 240 hour(s))  MRSA PCR SCREENING     Status: None   Collection Time    06/08/13  2:23 PM      Result Value Range Status   MRSA by PCR NEGATIVE  NEGATIVE Final   Comment:            The GeneXpert MRSA Assay (FDA     approved for NASAL specimens     only), is one component of a     comprehensive MRSA colonization     surveillance program. It is not     intended to diagnose MRSA     infection nor to guide or     monitor treatment for     MRSA infections.     Studies:  Recent x-ray studies have been reviewed in detail by the Attending Physician  Time spent : 35 min    ELLIS,ALLISON L., ANP [phone] 06/12/2013, 1:42 PM  I have personally examined this patient and reviewed the entire database. I have reviewed the above note, made any necessary editorial changes, and agree with its content.  Cherene Altes, MD [facility]

## 2013-06-13 LAB — COMPREHENSIVE METABOLIC PANEL
ALBUMIN: 2.6 g/dL — AB (ref 3.5–5.2)
ALT: 55 U/L — ABNORMAL HIGH (ref 0–53)
AST: 66 U/L — ABNORMAL HIGH (ref 0–37)
Alkaline Phosphatase: 84 U/L (ref 39–117)
BUN: 27 mg/dL — AB (ref 6–23)
CO2: 23 mEq/L (ref 19–32)
CREATININE: 1.48 mg/dL — AB (ref 0.50–1.35)
Calcium: 8 mg/dL — ABNORMAL LOW (ref 8.4–10.5)
Chloride: 109 mEq/L (ref 96–112)
GFR calc Af Amer: 56 mL/min — ABNORMAL LOW (ref >90)
GFR, EST NON AFRICAN AMERICAN: 49 mL/min — AB (ref >90)
Glucose, Bld: 142 mg/dL — ABNORMAL HIGH (ref 70–99)
Potassium: 3.9 mEq/L (ref 3.7–5.3)
Sodium: 144 mEq/L (ref 137–147)
Total Bilirubin: 0.3 mg/dL (ref 0.3–1.2)
Total Protein: 6.6 g/dL (ref 6.0–8.3)

## 2013-06-13 LAB — CBC
HCT: 34.3 % — ABNORMAL LOW (ref 39.0–52.0)
Hemoglobin: 11.1 g/dL — ABNORMAL LOW (ref 13.0–17.0)
MCH: 27.9 pg (ref 26.0–34.0)
MCHC: 32.4 g/dL (ref 30.0–36.0)
MCV: 86.2 fL (ref 78.0–100.0)
PLATELETS: 146 10*3/uL — AB (ref 150–400)
RBC: 3.98 MIL/uL — ABNORMAL LOW (ref 4.22–5.81)
RDW: 13.4 % (ref 11.5–15.5)
WBC: 6.2 10*3/uL (ref 4.0–10.5)

## 2013-06-13 LAB — GLUCOSE, CAPILLARY
GLUCOSE-CAPILLARY: 239 mg/dL — AB (ref 70–99)
GLUCOSE-CAPILLARY: 183 mg/dL — AB (ref 70–99)
Glucose-Capillary: 219 mg/dL — ABNORMAL HIGH (ref 70–99)
Glucose-Capillary: 383 mg/dL — ABNORMAL HIGH (ref 70–99)

## 2013-06-13 MED ORDER — INSULIN DETEMIR 100 UNIT/ML ~~LOC~~ SOLN
52.0000 [IU] | Freq: Two times a day (BID) | SUBCUTANEOUS | Status: DC
  Administered 2013-06-13 (×2): 52 [IU] via SUBCUTANEOUS
  Filled 2013-06-13 (×5): qty 0.52

## 2013-06-13 NOTE — Progress Notes (Signed)
Nutrition Brief Note  RD consulted by Diabetic Coordinator, Rosita Kea, for diabetes education. Pt has already been seen by RD staff and denies any nutrition questions at this time. Please see education note on 1/16.  Current diet order is Carbohydrate Modified Medium, patient is consuming approximately 100% of meals at this time. Labs and medications reviewed.   No nutrition interventions warranted at this time. If nutrition issues arise, please consult RD.   Inda Coke MS, RD, LDN Pager: [phone] After-hours pager: [phone]

## 2013-06-13 NOTE — Progress Notes (Signed)
[facility] [place] TEAM 1 - Stepdown/ICU TEAM  CHIA ROCK DJS:[id number] DOB: [birth year] DOA: 06/08/2013 PCP: Odette Fraction, MD  Brief narrative: 63 year old male patient with only significant past medical history of dyslipidemia and prostate cancer. Was sent to the ER by his primary care physician after laboratory data revealed extremely elevated serum glucose readings.   n the emergency department his serum glucose was greater than 1400, his CPK was around 2700. His sodium was normal, he was hyperkalemic at 6.0, he also had acute renal failure with a BUN of 87 and a creatinine of 3.04.   Assessment/Plan:  DKA in Newly diagnosed diabetes - switched from insulin infusion to s/c Detemir and Novlol 1/18 - sugars remain elevated-  Titrated up to 52 units 1/20 -cont meal coverage also-  -once renal function more stable consider add Metformin to improve insulin sensitization and possibly decrease insulin doses - consulted diabetes educator-rec OP Diabetes FU at clinic/ordered - cont teaching for insulin injections  Acute renal failure -slowly improving with hydration - cont IVFs at 75/hr -renal US reveals "dromedary hump" vs mass left kidney- triphasic CT recommended - given renal function need to pursue once renal function stabilized  Rhabdomyolysis -Continue to hydrate and follow CK in a.m.  Weight loss/Dehydration/hypernatremia -Initial water deficit calculated at 24 L -obtain daily weights and recalculate deficit daily so can adjust fluids -current deficit 1.85 liters  Jerking movements of extremities -suspect due to electrolyte disturbance - repleted phosphorus initially since was low-phos 2.7 after replete - Mg was 3.0  Metabolic encephalopathy - resolved -primarily due to DH,hypernatremia and hyperglycemia -EEG unremarkable re: seizure -MRI neg  Oral thrush -2/2 prolonged hyperglycemia -Nystatin S/S  GERD (gastroesophageal reflux disease)  DVT  prophylaxis: Lovenox Code Status: Full Family Communication: none Disposition Plan/Expected LOS: Remain inpatient- possible dc next 24 hrs  Consultants: None  Procedures: none  Antibiotics: None  HPI/Subjective: Endorsed cont'd issues with occ blurry vision and intermittent diploplia.  Objective: Blood pressure 116/66, pulse 76, temperature 98.3 F (36.8 C), temperature source Oral, resp. rate 18, height 6\' 1"  (1.854 m), weight 238 lb (107.956 kg), SpO2 100.00%.  Intake/Output Summary (Last 24 hours) at 06/13/13 1433 Last data filed at 06/13/13 0947  Gross per 24 hour  Intake    840 ml  Output      0 ml  Net    840 ml   Exam: General: No acute respiratory distress Lungs: Clear to auscultation bilaterally without wheezes or crackles, RA Cardiovascular: Regular tachycardic rate and rhythm without murmur gallop or rub normal S1 and S2, no peripheral edema or JVD Abdomen: Nontender, nondistended, soft, bowel sounds positive, no rebound, no ascites, no appreciable mass Musculoskeletal: No significant cyanosis, clubbing of bilateral lower extremities Neurological: Alert and oriented x at least to name and place, moves all extremities x 4 without focal neurological deficits, CN 2-12 intact including EOMs, slightly tremulous but no evidence of clonus  Scheduled Meds:  Scheduled Meds: . aspirin EC  81 mg Oral Daily  . atorvastatin  80 mg Oral q1800  . enoxaparin (LOVENOX) injection  40 mg Subcutaneous Q24H  . febuxostat  80 mg Oral Daily  . insulin aspart  0-5 Units Subcutaneous QHS  . insulin aspart  0-9 Units Subcutaneous TID WC  . insulin aspart  12 Units Subcutaneous TID WC  . insulin detemir  52 Units Subcutaneous BID  . nystatin  5 mL Oral QID  . pantoprazole  40 mg Oral Daily  . phosphorus  250 mg Oral TID    Data Reviewed: Basic Metabolic Panel:  Recent Labs Lab 06/09/13 1055 06/09/13 1455  06/09/13 2333 06/10/13 0341 06/10/13 0925 06/11/13 0330  06/12/13 0325 06/13/13 0530  NA 162* 162*  < > 166* 168*  --  150* 147 144  K 4.7 4.1  < > 3.8 3.8  --  4.4 4.0 3.9  CL 127* 127*  < > >130* >130*  --  116* 113* 109  CO2 19 19  < > 19 23  --  21 22 23   GLUCOSE 234* 290*  < > 159* 122*  --  362* 285* 142*  BUN 78* 79*  < > 79* 77*  --  60* 40* 27*  CREATININE 3.01* 3.22*  < > 3.27* 3.26*  --  2.24* 1.69* 1.48*  CALCIUM 9.7 9.4  < > 9.2 9.2  --  7.8* 7.7* 8.0*  MG  --  3.0*  --   --   --   --   --   --   --   PHOS  --  1.7*  --   --   --  2.7  --   --   --   < > = values in this interval not displayed. Liver Function Tests:  Recent Labs Lab 06/07/13 1125 06/08/13 0930 06/13/13 0530  AST 15 26 66*  ALT 15 18 55*  ALKPHOS 125* 121* 84  BILITOT 0.5 0.3 0.3  PROT 9.0* 9.1* 6.6  ALBUMIN 4.4 3.9 2.6*   CBC:  Recent Labs Lab 06/07/13 1125 06/08/13 0930 06/08/13 1600 06/13/13 0530  WBC 9.6 12.6* 13.6* 6.2  NEUTROABS 7.3 10.7*  --   --   HGB 14.1 14.5 15.0 11.1*  HCT 46.3 48.4 44.8 34.3*  MCV 94.3 96.2 88.2 86.2  PLT 346 308 269 146*   Cardiac Enzymes:  Recent Labs Lab 06/07/13 1125 06/08/13 1600 06/09/13 1455 06/10/13 0925 06/12/13 0325  CKTOTAL 501* 2716* 8970* 6086* 1358*   CBG:  Recent Labs Lab 06/12/13 1404 06/12/13 1737 06/12/13 2028 06/13/13 0753 06/13/13 1213  GLUCAP 325* 298* 256* 219* 383*    Recent Results (from the past 240 hour(s))  MRSA PCR SCREENING     Status: None   Collection Time    06/08/13  2:23 PM      Result Value Range Status   MRSA by PCR NEGATIVE  NEGATIVE Final   Comment:            The GeneXpert MRSA Assay (FDA     approved for NASAL specimens     only), is one component of a     comprehensive MRSA colonization     surveillance program. It is not     intended to diagnose MRSA     infection nor to guide or     monitor treatment for     MRSA infections.     Studies:  Recent x-ray studies have been reviewed in detail by the Attending Physician  Time spent : 25  min    ELLIS,ALLISON L., ANP [phone] 06/13/2013, 2:33 PM   I have examined the patient, reviewed the chart and modified the above note which I agree with.   Khyler Urda,MD [phone] 06/13/2013, 5:52 PM

## 2013-06-14 ENCOUNTER — Inpatient Hospital Stay (HOSPITAL_COMMUNITY): Payer: 59

## 2013-06-14 LAB — GLUCOSE, CAPILLARY
GLUCOSE-CAPILLARY: 361 mg/dL — AB (ref 70–99)
GLUCOSE-CAPILLARY: 216 mg/dL — AB (ref 70–99)
Glucose-Capillary: 317 mg/dL — ABNORMAL HIGH (ref 70–99)
Glucose-Capillary: 109 mg/dL — ABNORMAL HIGH (ref 70–99)

## 2013-06-14 LAB — BASIC METABOLIC PANEL
BUN: 22 mg/dL (ref 6–23)
CO2: 22 mEq/L (ref 19–32)
CREATININE: 1.3 mg/dL (ref 0.50–1.35)
Calcium: 7.8 mg/dL — ABNORMAL LOW (ref 8.4–10.5)
Chloride: 106 mEq/L (ref 96–112)
GFR calc non Af Amer: 57 mL/min — ABNORMAL LOW (ref >90)
GFR, EST AFRICAN AMERICAN: 66 mL/min — AB (ref >90)
Glucose, Bld: 321 mg/dL — ABNORMAL HIGH (ref 70–99)
Potassium: 3.7 mEq/L (ref 3.7–5.3)
Sodium: 141 mEq/L (ref 137–147)

## 2013-06-14 MED ORDER — INSULIN DETEMIR 100 UNIT/ML ~~LOC~~ SOLN
58.0000 [IU] | Freq: Two times a day (BID) | SUBCUTANEOUS | Status: DC
  Administered 2013-06-14 (×2): 58 [IU] via SUBCUTANEOUS
  Filled 2013-06-14 (×4): qty 0.58

## 2013-06-14 MED ORDER — INSULIN DETEMIR 100 UNIT/ML ~~LOC~~ SOLN
58.0000 [IU] | Freq: Two times a day (BID) | SUBCUTANEOUS | Status: DC
  Filled 2013-06-14: qty 0.58

## 2013-06-14 MED ORDER — IOHEXOL 350 MG/ML SOLN
100.0000 mL | Freq: Once | INTRAVENOUS | Status: AC | PRN
  Administered 2013-06-14: 100 mL via INTRAVENOUS

## 2013-06-14 MED ORDER — INSULIN ASPART 100 UNIT/ML ~~LOC~~ SOLN
16.0000 [IU] | Freq: Three times a day (TID) | SUBCUTANEOUS | Status: DC
  Administered 2013-06-14 – 2013-06-15 (×4): 16 [IU] via SUBCUTANEOUS

## 2013-06-14 NOTE — Care Management Note (Addendum)
Noted request to explore pt copay for Levemir and insulin supplies.  Benefit check was sent 06/09/2013 and per insurance provider pt copay for Levemir would be $40 per 31 day supply $80 for 62 days and $120 for 90 days. Other supplies are reported to be covered.  Jasmine Pang RN MPH, case manager, [phone] Per Levemir pt assistance program , pt may be eligible for reduced copay of $25 per 30 day supply, pt will need to call pt assistance number @ [phone]. CM also will provide a copay card that the pt will be required to active which may provide the prescription for $25 copay. Please notify CM of plan to d/c so card can be given when prescriptions are given.  Jasmine Pang RN MPH, case manager, [phone]

## 2013-06-14 NOTE — Progress Notes (Signed)
Late Entry  Spoke to Erin Hearing, NP who stated it was okay to leave in pt's outdated PIV due to him possibly being discharged tomorrow. Will change IV tubing per protocol.   Aaron Edelman, Lindsey Demonte [place]

## 2013-06-14 NOTE — Progress Notes (Signed)
[facility] [place] TEAM 1 - Stepdown/ICU TEAM  DEVEAN SKOCZYLAS ZOX:[id number] DOB: 04-Aug-1950 DOA: 06/08/2013 PCP: Odette Fraction, MD  Brief narrative: 63 year old male patient with only significant past medical history of dyslipidemia and prostate cancer. Was sent to the ER by his primary care physician after laboratory data revealed extremely elevated serum glucose readings.   In the emergency department his serum glucose was greater than 1400, his CPK was around 2700. His sodium was normal, he was hyperkalemic at 6.0, he also had acute renal failure with a BUN of 87 and a creatinine of 3.04.   Assessment/Plan:  DKA in Newly diagnosed diabetes - switched from insulin infusion to s/c Detemir and Novlol 1/18 - sugars remain elevated - titrated up to 58 units 1/21 -cont meal coverage and increase to 16 units  - consider add Metformin to improve insulin sensitization and possibly decrease insulin doses-likely as OP - consulted diabetes educator-rec OP Diabetes FU at clinic/ordered - cont teaching for insulin injections  Acute renal failure -slowly improving with hydration - cont IVFs at 75/hr -renal US reveals "dromedary hump" vs mass left kidney - triphasic CT recommended - ordered  Rhabdomyolysis -Continue to hydrate  -will need to be completely resolved before can begin Metformin  Weight loss/Dehydration/hypernatremia -Initial water deficit calculated at 24 L -obtain daily weights and recalculate deficit daily so can adjust fluids -decrease fluids to 50/hr  Jerking movements of extremities -suspect due to electrolyte disturbance - repleted phosphorus initially since was low-phos 2.7 after replete - Mg was 3.0  Metabolic encephalopathy - resolved -primarily due to DH,hypernatremia and hyperglycemia -EEG unremarkable re: seizure -MRI neg  Oral thrush -2/2 prolonged hyperglycemia -Nystatin S/S  GERD (gastroesophageal reflux disease)  DVT prophylaxis:  Lovenox Code Status: Full Family Communication: spoke w/ pt and wife at bedside  Disposition Plan/Expected LOS: possible dc next 24 hrs  Consultants: None  Procedures: none  Antibiotics: None  HPI/Subjective: No complaints today.  Denies cp, n/v, or abdom pain.    Objective: Blood pressure 112/82, pulse 79, temperature 97.4 F (36.3 C), temperature source Oral, resp. rate 18, height 6\' 1"  (1.854 m), weight 238 lb (107.956 kg), SpO2 98.00%.  Intake/Output Summary (Last 24 hours) at 06/14/13 1441 Last data filed at 06/14/13 0855  Gross per 24 hour  Intake   1080 ml  Output    750 ml  Net    330 ml   Exam: General: No acute respiratory distress Lungs: Clear to auscultation bilaterally without wheezes or crackles, RA Cardiovascular: Regular rate and rhythm without murmur gallop or rub normal S1 and S2, no peripheral edema  Abdomen: Nontender, nondistended, soft, bowel sounds positive, no rebound, no ascites, no appreciable mass Musculoskeletal: No significant cyanosis, clubbing of bilateral lower extremities Neurological: Alert and oriented, moves all extremities x 4 without focal neurological deficits  Scheduled Meds:  Scheduled Meds: . aspirin EC  81 mg Oral Daily  . atorvastatin  80 mg Oral q1800  . enoxaparin (LOVENOX) injection  40 mg Subcutaneous Q24H  . febuxostat  80 mg Oral Daily  . insulin aspart  0-5 Units Subcutaneous QHS  . insulin aspart  0-9 Units Subcutaneous TID WC  . insulin aspart  16 Units Subcutaneous TID WC  . insulin detemir  58 Units Subcutaneous BID  . nystatin  5 mL Oral QID  . pantoprazole  40 mg Oral Daily  . phosphorus  250 mg Oral TID    Data Reviewed: Basic Metabolic Panel:  Recent Labs Lab 06/09/13  1055 06/09/13 1455  06/10/13 0341 06/10/13 0925 06/11/13 0330 06/12/13 0325 06/13/13 0530 06/14/13 0529  NA 162* 162*  < > 168*  --  150* 147 144 141  K 4.7 4.1  < > 3.8  --  4.4 4.0 3.9 3.7  CL 127* 127*  < > >130*  --  116*  113* 109 106  CO2 19 19  < > 23  --  21 22 23 22   GLUCOSE 234* 290*  < > 122*  --  362* 285* 142* 321*  BUN 78* 79*  < > 77*  --  60* 40* 27* 22  CREATININE 3.01* 3.22*  < > 3.26*  --  2.24* 1.69* 1.48* 1.30  CALCIUM 9.7 9.4  < > 9.2  --  7.8* 7.7* 8.0* 7.8*  MG  --  3.0*  --   --   --   --   --   --   --   PHOS  --  1.7*  --   --  2.7  --   --   --   --   < > = values in this interval not displayed.  Liver Function Tests:  Recent Labs Lab 06/08/13 0930 06/13/13 0530  AST 26 66*  ALT 18 55*  ALKPHOS 121* 84  BILITOT 0.3 0.3  PROT 9.1* 6.6  ALBUMIN 3.9 2.6*   CBC:  Recent Labs Lab 06/08/13 0930 06/08/13 1600 06/13/13 0530  WBC 12.6* 13.6* 6.2  NEUTROABS 10.7*  --   --   HGB 14.5 15.0 11.1*  HCT 48.4 44.8 34.3*  MCV 96.2 88.2 86.2  PLT 308 269 146*   Cardiac Enzymes:  Recent Labs Lab 06/08/13 1600 06/09/13 1455 06/10/13 0925 06/12/13 0325  CKTOTAL 2716* 8970* 6086* 1358*   CBG:  Recent Labs Lab 06/13/13 1213 06/13/13 1648 06/13/13 2148 06/14/13 0732 06/14/13 1208  GLUCAP 383* 239* 183* 317* 361*    Recent Results (from the past 240 hour(s))  MRSA PCR SCREENING     Status: None   Collection Time    06/08/13  2:23 PM      Result Value Range Status   MRSA by PCR NEGATIVE  NEGATIVE Final   Comment:            The GeneXpert MRSA Assay (FDA     approved for NASAL specimens     only), is one component of a     comprehensive MRSA colonization     surveillance program. It is not     intended to diagnose MRSA     infection nor to guide or     monitor treatment for     MRSA infections.     Studies:  Recent x-ray studies have been reviewed in detail by the Attending Physician  Time spent : 25 min    ELLIS,ALLISON L., ANP [phone] 06/14/2013, 2:41 PM  I have personally examined this patient and reviewed the entire database. I have reviewed the above note, made any necessary editorial changes, and agree with its content.  Cherene Altes,  MD [facility]

## 2013-06-15 ENCOUNTER — Other Ambulatory Visit: Payer: Self-pay | Admitting: *Deleted

## 2013-06-15 DIAGNOSIS — N179 Acute kidney failure, unspecified: Secondary | ICD-10-CM

## 2013-06-15 DIAGNOSIS — E860 Dehydration: Secondary | ICD-10-CM

## 2013-06-15 DIAGNOSIS — E1110 Type 2 diabetes mellitus with ketoacidosis without coma: Secondary | ICD-10-CM

## 2013-06-15 LAB — COMPREHENSIVE METABOLIC PANEL
ALT: 51 U/L (ref 0–53)
AST: 53 U/L — ABNORMAL HIGH (ref 0–37)
Albumin: 2.5 g/dL — ABNORMAL LOW (ref 3.5–5.2)
Alkaline Phosphatase: 78 U/L (ref 39–117)
BUN: 15 mg/dL (ref 6–23)
CALCIUM: 8.1 mg/dL — AB (ref 8.4–10.5)
CO2: 23 mEq/L (ref 19–32)
CREATININE: 1.18 mg/dL (ref 0.50–1.35)
Chloride: 103 mEq/L (ref 96–112)
GFR calc non Af Amer: 64 mL/min — ABNORMAL LOW (ref >90)
GFR, EST AFRICAN AMERICAN: 74 mL/min — AB (ref >90)
GLUCOSE: 153 mg/dL — AB (ref 70–99)
Potassium: 4.6 mEq/L (ref 3.7–5.3)
Sodium: 139 mEq/L (ref 137–147)
TOTAL PROTEIN: 6.1 g/dL (ref 6.0–8.3)
Total Bilirubin: <0.2 mg/dL — ABNORMAL LOW (ref 0.3–1.2)

## 2013-06-15 LAB — GLUCOSE, CAPILLARY
Glucose-Capillary: 175 mg/dL — ABNORMAL HIGH (ref 70–99)
Glucose-Capillary: 273 mg/dL — ABNORMAL HIGH (ref 70–99)

## 2013-06-15 LAB — CK: Total CK: 362 U/L — ABNORMAL HIGH (ref 7–232)

## 2013-06-15 MED ORDER — GLUCOSE BLOOD VI STRP: ORAL_STRIP | Status: DC

## 2013-06-15 MED ORDER — INSULIN ASPART 100 UNIT/ML ~~LOC~~ SOLN: 16.0000 [IU] | Freq: Three times a day (TID) | SUBCUTANEOUS | Status: DC

## 2013-06-15 MED ORDER — INSULIN DETEMIR 100 UNIT/ML FLEXPEN: 65.0000 [IU] | PEN_INJECTOR | Freq: Every day | SUBCUTANEOUS | Status: DC

## 2013-06-15 MED ORDER — INSULIN DETEMIR 100 UNIT/ML ~~LOC~~ SOLN
65.0000 [IU] | Freq: Every day | SUBCUTANEOUS | Status: DC
  Administered 2013-06-15: 65 [IU] via SUBCUTANEOUS
  Filled 2013-06-15: qty 0.65

## 2013-06-15 MED ORDER — INSULIN DETEMIR 100 UNIT/ML ~~LOC~~ SOLN
60.0000 [IU] | Freq: Every day | SUBCUTANEOUS | Status: DC
  Filled 2013-06-15: qty 0.6

## 2013-06-15 MED ORDER — INSULIN ASPART 100 UNIT/ML CARTRIDGE (PENFILL): 16.0000 [IU] | Freq: Three times a day (TID) | SUBCUTANEOUS | Status: DC

## 2013-06-15 MED ORDER — FREESTYLE SYSTEM KIT: 1.0000 | PACK | Status: DC | PRN

## 2013-06-15 MED ORDER — INSULIN PEN NEEDLE 31G X 6 MM MISC: Status: DC

## 2013-06-15 MED ORDER — INSULIN DETEMIR 100 UNIT/ML FLEXPEN: 60.0000 [IU] | PEN_INJECTOR | Freq: Every day | SUBCUTANEOUS | Status: DC

## 2013-06-15 NOTE — Care Management Note (Signed)
   CARE MANAGEMENT NOTE 06/15/2013  Patient:  Curtis Hunt, Curtis Hunt   Account Number:  [id number]  Date Initiated:  06/08/2013  Documentation initiated by:  Elissa Hefty  Subjective/Objective Assessment:   adm w hyperglycemia     Action/Plan:   lives w wife, pcp dr Cletus Gash pickard  06/15/2013 Met with pt and pt given Card for $25 copay, sister in room with pt. Pt states that he will call the number to activate the card now.   Anticipated DC Date:  06/15/2013   Anticipated DC Plan:  [place]  CM consult      Choice offered to / List presented to:             Status of service:  Completed, signed off Medicare Important Message given?   (If response is "NO", the following Medicare IM given date fields will be blank) Date Medicare IM given:   Date Additional Medicare IM given:    Discharge Disposition:    Per UR Regulation:  Reviewed for med. necessity/level of care/duration of stay  If discussed at [place] of Stay Meetings, dates discussed:   06/13/2013    Comments:  06/15/2013 Noted $40 copay and pt was given manufacture card for $25 copay for insured patients , explained to pt and his sister the process for activating the card and the pt states that he will call the number to active before leaving the hospital.    CRoyal RN MPH, case manager, [phone]  1/16 1143a debbie dowell rn,bsn pt has 40.00 copay for ea ins per cm sec. her glucometer and strips have coverage but was not able to get exact amt.

## 2013-06-15 NOTE — Progress Notes (Signed)
Patient discharged.  Patient educated on discharge medications, follow- up appointments, and discharge instructions.  Patient educated on use of insulin and demonstrated skill appropriately.  IV removed.  Belongings gathered.  Prescriptions given to patient to pick up at any pharmacy.  Patient escorted off unit via wheelchair with NT.

## 2013-06-15 NOTE — Discharge Instructions (Signed)
Blood Glucose Monitoring, Adult Monitoring your blood glucose (also know as blood sugar) helps you to manage your diabetes. It also helps you and your health care provider monitor your diabetes and determine how well your treatment plan is working. WHY SHOULD YOU MONITOR YOUR BLOOD GLUCOSE?  It can help you understand how food, exercise, and medicine affect your blood glucose.  It allows you to know what your blood glucose is at any given moment. You can quickly tell if you are having low blood glucose (hypoglycemia) or high blood glucose (hyperglycemia).  It can help you and your health care provider know how to adjust your medicines.  It can help you understand how to manage an illness or adjust medicine for exercise. WHEN SHOULD YOU TEST? Your health care provider will help you decide how often you should check your blood glucose. This may depend on the type of diabetes you have, your diabetes control, or the types of medicines you are taking. Be sure to write down all of your blood glucose readings so that this information can be reviewed with your health care provider. See below for examples of testing times that your health care provider may suggest. Type 1 Diabetes  Test 4 times a day if you are in good control, using an insulin pump, or perform multiple daily injections.  If your diabetes is not well-controlled or if you are sick, you may need to monitor more often.  It is a good idea to also monitor:  Before and after exercise.  Between meals and 2 hours after a meal.  Occasionally between 2:00 to 3:00 am. Type 2 Diabetes  It can vary with each person, but generally, if you are on insulin, test 4 times a day.  If you take medicines by mouth (orally), test 2 times a day.  If you are on a controlled diet, test once a day.  If your diabetes is not well controlled or if you are sick, you may need to monitor more often. HOW TO MONITOR YOUR BLOOD GLUCOSE Supplies  Needed  Blood glucose meter.  Test strips for your meter. Each meter has its own strips. You must use the strips that go with your own meter.  A pricking needle (lancet).  A device that holds the lancet (lancing device).  A journal or log book to write down your results. Procedure  Wash your hands with soap and water. Alcohol is not preferred.  Prick the side of your finger (not the tip) with the lancet.  Gently milk the finger until a small drop of blood appears.  Follow the instructions that come with your meter for inserting the test strip, applying blood to the strip, and using your blood glucose meter. Other Areas to Get Blood for Testing Some meters allow you to use other areas of your body (other than your finger) to test your blood. These areas are called alternative sites. The most common alternative sites are:  The forearm.  The thigh.  The back area of the lower leg.  The palm of the hand. The blood flow in these areas is slower. Therefore, the blood glucose values you get may be delayed, and the numbers are different from what you would get from your fingers. Do not use alternative sites if you think you are having hypoglycemia. Your reading will not be accurate. Always use a finger if you are having hypoglycemia. Also, if you cannot feel your lows (hypoglycemia unawareness), always use your fingers for your blood  glucose checks. ADDITIONAL TIPS FOR GLUCOSE MONITORING  Do not reuse lancets.  Always carry your supplies with you.  All blood glucose meters have a 24-hour "hotline" number to call if you have questions or need help.  Adjust (calibrate) your blood glucose meter with a control solution after finishing a few boxes of strips. BLOOD GLUCOSE RECORD KEEPING It is a good idea to keep a daily record or log of your blood glucose readings. Most glucose meters, if not all, keep your glucose records stored in the meter. Some meters come with the ability to download  your records to your home computer. Keeping a record of your blood glucose readings is especially helpful if you are wanting to look for patterns. Make notes to go along with the blood glucose readings because you might forget what happened at that exact time. Keeping good records helps you and your health care provider to work together to achieve good diabetes management.  Document Released: 05/14/2003 Document Revised: 01/11/2013 Document Reviewed: 10/03/2012 [facility] Patient Information 2014 [place].  Diabetes and Exercise Exercising regularly is important. It is not just about losing weight. It has many health benefits, such as:  Improving your overall fitness, flexibility, and endurance.  Increasing your bone density.  Helping with weight control.  Decreasing your body fat.  Increasing your muscle strength.  Reducing stress and tension.  Improving your overall health. People with diabetes who exercise gain additional benefits because exercise:  Reduces appetite.  Improves the body's use of blood sugar (glucose).  Helps lower or control blood glucose.  Decreases blood pressure.  Helps control blood lipids (such as cholesterol and triglycerides).  Improves the body's use of the hormone insulin by:  Increasing the body's insulin sensitivity.  Reducing the body's insulin needs.  Decreases the risk for heart disease because exercising:  Lowers cholesterol and triglycerides levels.  Increases the levels of good cholesterol (such as high-density lipoproteins [HDL]) in the body.  Lowers blood glucose levels. YOUR ACTIVITY PLAN  Choose an activity that you enjoy and set realistic goals. Your health care provider or diabetes educator can help you make an activity plan that works for you. You can break activities into 2 or 3 sessions throughout the day. Doing so is as good as one long session. Exercise ideas include:  Taking the dog for a walk.  Taking the stairs  instead of the elevator.  Dancing to your favorite song.  Doing your favorite exercise with a friend. RECOMMENDATIONS FOR EXERCISING WITH TYPE 1 OR TYPE 2 DIABETES   Check your blood glucose before exercising. If blood glucose levels are greater than 240 mg/dL, check for urine ketones. Do not exercise if ketones are present.  Avoid injecting insulin into areas of the body that are going to be exercised. For example, avoid injecting insulin into:  The arms when playing tennis.  The legs when jogging.  Keep a record of:  Food intake before and after you exercise.  Expected peak times of insulin action.  Blood glucose levels before and after you exercise.  The type and amount of exercise you have done.  Review your records with your health care provider. Your health care provider will help you to develop guidelines for adjusting food intake and insulin amounts before and after exercising.  If you take insulin or oral hypoglycemic agents, watch for signs and symptoms of hypoglycemia. They include:  Dizziness.  Shaking.  Sweating.  Chills.  Confusion.  Drink plenty of water while you exercise  to prevent dehydration or heat stroke. Body water is lost during exercise and must be replaced.  Talk to your health care provider before starting an exercise program to make sure it is safe for you. Remember, almost any type of activity is better than none. Document Released: 08/01/2003 Document Revised: 01/11/2013 Document Reviewed: 10/18/2012 [facility] Patient Information 2014 [place].  Diabetes and Small Vessel Disease Small vessel disease (microvascular disease) includes nephropathy, retinopathy, and neuropathy. People with diabetes are at risk for these problems, but keeping blood glucose (sugar) controlled is helpful in preventing problems. DIABETIC KIDNEY PROBLEMS (DIABETIC NEPHROPATHY)  Diabetic nephropathy occurs in many patients with diabetes.  Damage to the small  vessels in the kidneys is the leading cause of end-stage renal disease (ESRD).  Protein in the urine (albuminuria) in the range of 30 to 300 mg/24 h (microalbuminuria) is a sign of the earliest stage of diabetic nephropathy.  Good blood glucose (sugar) and blood pressure control significantly reduce the progression of nephropathy. DIABETIC EYE PROBLEMS (DIABETIC RETINOPATHY)  Diabetic retinopathy is the most common cause of new cases of blindness in adults. It is related to the number of years you have had diabetes.  Common risk factors include high blood sugar (hyperglycemia), high blood pressure (hypertension), and poorly controlled blood lipids such as high blood cholesterol (hypercholesterolemia). DIABETIC NERVE PROBLEMS (DIABETIC NEUROPATHY) Diabetic neuropathy is the most common, long-term complication of diabetes. It is responsible for more than half of leg amputations not due to accidents. The main risk for developing diabetic neuropathy seems to be uncontrolled blood sugars. Hyperglycemia damages the nerve fibers causing sensation (feeling) problems. The closer you can keep the following guidelines, the better chance you will have avoiding problems from small vessel disease.  Working toward near normal blood glucose or as normal as possible. You will need to keep your blood glucose and A1c at the target range prescribed by your caregiver.  Keep your blood pressure less than 120/80.  Keep your low-density lipoprotein (LDL) cholesterol (one of the fats in your blood) at less than 100 mg/dL. An LDL less than 70 mg/dL may be recommended for high risk patients. You cannot change your family history, but it is important to change the risk factors that you can. Risk factors you can control include:  Controlling high blood pressure.  Stopping smoking.  Using alcohol only in moderation. Generally, this means about one drink per day for women and two drinks per day for men.  Controlling  your blood lipids (cholesterol and triglycerides).  Treating heart problems, if these are contributing to risk. SEEK MEDICAL CARE IF:   You are having problems keeping your blood glucose in goal range.  You notice a change in your vision or new problems with your vision.  You have wound or sore that does not heal.  Your blood pressure is above the target range. Document Released: 05/14/2003 Document Revised: 04/27/2012 Document Reviewed: 10/19/2008 [facility] Patient Information 2014 [place], [place].  Diabetes and Foot Care Diabetes may cause you to have problems because of poor blood supply (circulation) to your feet and legs. This may cause the skin on your feet to become thinner, break easier, and heal more slowly. Your skin may become dry, and the skin may peel and crack. You may also have nerve damage in your legs and feet causing decreased feeling in them. You may not notice minor injuries to your feet that could lead to infections or more serious problems. Taking care of your feet is one  of the most important things you can do for yourself.  HOME CARE INSTRUCTIONS  Wear shoes at all times, even in the house. Do not go barefoot. Bare feet are easily injured.  Check your feet daily for blisters, cuts, and redness. If you cannot see the bottom of your feet, use a mirror or ask someone for help.  Wash your feet with warm water (do not use hot water) and mild soap. Then pat your feet and the areas between your toes until they are completely dry. Do not soak your feet as this can dry your skin.  Apply a moisturizing lotion or petroleum jelly (that does not contain alcohol and is unscented) to the skin on your feet and to dry, brittle toenails. Do not apply lotion between your toes.  Trim your toenails straight across. Do not dig under them or around the cuticle. File the edges of your nails with an emery board or nail file.  Do not cut corns or calluses or try to remove them with  medicine.  Wear clean socks or stockings every day. Make sure they are not too tight. Do not wear knee-high stockings since they may decrease blood flow to your legs.  Wear shoes that fit properly and have enough cushioning. To break in new shoes, wear them for just a few hours a day. This prevents you from injuring your feet. Always look in your shoes before you put them on to be sure there are no objects inside.  Do not cross your legs. This may decrease the blood flow to your feet.  If you find a minor scrape, cut, or break in the skin on your feet, keep it and the skin around it clean and dry. These areas may be cleansed with mild soap and water. Do not cleanse the area with peroxide, alcohol, or iodine.  When you remove an adhesive bandage, be sure not to damage the skin around it.  If you have a wound, look at it several times a day to make sure it is healing.  Do not use heating pads or hot water bottles. They may burn your skin. If you have lost feeling in your feet or legs, you may not know it is happening until it is too late.  Make sure your health care provider performs a complete foot exam at least annually or more often if you have foot problems. Report any cuts, sores, or bruises to your health care provider immediately. SEEK MEDICAL CARE IF:   You have an injury that is not healing.  You have cuts or breaks in the skin.  You have an ingrown nail.  You notice redness on your legs or feet.  You feel burning or tingling in your legs or feet.  You have pain or cramps in your legs and feet.  Your legs or feet are numb.  Your feet always feel cold. SEEK IMMEDIATE MEDICAL CARE IF:   There is increasing redness, swelling, or pain in or around a wound.  There is a red line that goes up your leg.  Pus is coming from a wound.  You develop a fever or as directed by your health care provider.  You notice a bad smell coming from an ulcer or wound. Document Released:  05/08/2000 Document Revised: 01/11/2013 Document Reviewed: 10/18/2012 [facility] Patient Information 2014 [place].  Diabetes and Sick Day Management Blood sugar (glucose) can be more difficult to control when you are sick. Colds, fever, flu, nausea, vomiting, and  diarrhea are all examples of common illnesses that can cause problems for people with diabetes. Loss of body fluids (dehydration) from fever, vomiting, diarrhea, infection, and the stress of a sickness can all cause blood glucose levels to increase. Because of this, it is very important to take your diabetes medicines and to eat some form of carbohydrate food when you are sick. Liquid or soft foods are often tolerated, and they help to replace fluids. HOME CARE INSTRUCTIONS These main guidelines are intended for managing a short-term (24 hours or less) sickness:  Take your usual dose of insulin or oral diabetes medicine. An exception would be if you take any form of metformin. If you cannot eat or drink, you can become dehydrated and should not take this medicine.  Continue to take your insulin even if you are unable to eat solid foods or are vomiting. Your insulin dose may stay the same, or it may need to be increased when you are sick.  You will need to test your blood glucose more often, generally every 2 4 hours. If you have type 1 diabetes, test your urine for ketones every 4 hours. If you have type 2 diabetes, test your urine for ketones as directed by your health care provider.  Eat some form of food that contains carbohydrates. The carbohydrates can be in solid or liquid form. You should eat 45 50 g of carbohydrates every 3 4 hours.  Replace fluids if you have a fever, vomit, or have diarrhea. Ask your health care provider for specific rehydration instructions.  Watch carefully for the signs of ketoacidosis if you have type 1 diabetes. Call your health care provider if any of the following symptoms are present, especially in  children:  Moderate to large ketones in the urine along with a high blood glucose level.  Severe nausea.  Vomiting.  Diarrhea.  Abdominal pain.  Rapid breathing.  Drink extra liquids that do not contain sugar, such as water or sugar-free liquids, or caffeine.  Be careful with over-the-counter medicines. Read the labels. They may contain sugar or types of sugars that can increase your blood glucose level. Food Choices for Illness All of the food choices below contain about 15 g of carbohydrates. Plan ahead and keep some of these foods around.    to  cup carbonated beverage containing sugar. Carbonated beverages will usually be better tolerated if they are opened and left at room temperature for a few minutes.   of a twin frozen ice pop.   cup regular gelatin.   cup juice.   cup ice cream or frozen yogurt.   cup cooked cereal.   cup sherbet.  1 cup clear broth or soup.  1 cup cream soup.   cup regular custard.   cup regular pudding.  1 cup sports drink.  1 cup plain yogurt.  1 slice toast.  6 squares saltine crackers.  5 vanilla wafers.   cup sports drink. SEEK MEDICAL CARE IF:   You are unable to drink fluids, even small amounts.  You have nausea and vomiting for more than 6 hours.  You have diarrhea for more than 6 hours.  Your blood glucose level is more than 240 mg/dL, even with additional insulin.  There is a change in mental status.  You develop an additional serious sickness.  You have been sick for 2 days and are not getting better.  You have had a fever for 2 days. SEEK IMMEDIATE MEDICAL CARE IF:  You have difficulty  breathing.  You have moderate to large ketone levels. MAKE SURE YOU:  Understand these instructions.  Will watch your condition.  Will get help right away if you are not doing well or get worse. Document Released: 05/14/2003 Document Revised: 01/11/2013 Document Reviewed: 10/18/2012 [facility] Patient  Information 2014 [place].  Diabetes Meal Planning Guide The diabetes meal planning guide is a tool to help you plan your meals and snacks. It is important for people with diabetes to manage their blood glucose (sugar) levels. Choosing the right foods and the right amounts throughout your day will help control your blood glucose. Eating right can even help you improve your blood pressure and reach or maintain a healthy weight. CARBOHYDRATE COUNTING MADE EASY When you eat carbohydrates, they turn to sugar. This raises your blood glucose level. Counting carbohydrates can help you control this level so you feel better. When you plan your meals by counting carbohydrates, you can have more flexibility in what you eat and balance your medicine with your food intake. Carbohydrate counting simply means adding up the total amount of carbohydrate grams in your meals and snacks. Try to eat about the same amount at each meal. Foods with carbohydrates are listed below. Each portion below is 1 carbohydrate serving or 15 grams of carbohydrates. Ask your dietician how many grams of carbohydrates you should eat at each meal or snack. Grains and Starches  1 slice bread.   English muffin or hotdog/hamburger bun.   cup cold cereal (unsweetened).   cup cooked pasta or rice.   cup starchy vegetables (corn, potatoes, peas, beans, winter squash).  1 tortilla (6 inches).   bagel.  1 waffle or pancake (size of a CD).   cup cooked cereal.  4 to 6 small crackers. *Whole grain is recommended. Fruit  1 cup fresh unsweetened berries, melon, papaya, pineapple.  1 small fresh fruit.   banana or mango.   cup fruit juice (4 oz unsweetened).   cup canned fruit in natural juice or water.  2 tbs dried fruit.  12 to 15 grapes or cherries. Milk and Yogurt  1 cup fat-free or 1% milk.  1 cup soy milk.  6 oz light yogurt with sugar-free sweetener.  6 oz low-fat soy yogurt.  6 oz plain  yogurt. Vegetables  1 cup raw or  cup cooked is counted as 0 carbohydrates or a "free" food.  If you eat 3 or more servings at 1 meal, count them as 1 carbohydrate serving. Other Carbohydrates   oz chips or pretzels.   cup ice cream or frozen yogurt.   cup sherbet or sorbet.  2 inch square cake, no frosting.  1 tbs honey, sugar, jam, jelly, or syrup.  2 small cookies.  3 squares of graham crackers.  3 cups popcorn.  6 crackers.  1 cup broth-based soup.  Count 1 cup casserole or other mixed foods as 2 carbohydrate servings.  Foods with less than 20 calories in a serving may be counted as 0 carbohydrates or a "free" food. You may want to purchase a book or computer software that lists the carbohydrate gram counts of different foods. In addition, the nutrition facts panel on the labels of the foods you eat are a good source of this information. The label will tell you how big the serving size is and the total number of carbohydrate grams you will be eating per serving. Divide this number by 15 to obtain the number of carbohydrate servings in a portion. Remember,  1 carbohydrate serving equals 15 grams of carbohydrate. SERVING SIZES Measuring foods and serving sizes helps you make sure you are getting the right amount of food. The list below tells how big or small some common serving sizes are.  1 oz.........4 stacked dice.  3 oz........Marland KitchenDeck of cards.  1 tsp.......Marland KitchenTip of little finger.  1 tbs......Marland KitchenMarland KitchenThumb.  2 tbs.......Marland KitchenGolf ball.   cup......Marland KitchenHalf of a fist.  1 cup.......Marland KitchenA fist. SAMPLE DIABETES MEAL PLAN Below is a sample meal plan that includes foods from the grain and starches, dairy, vegetable, fruit, and meat groups. A dietician can individualize a meal plan to fit your calorie needs and tell you the number of servings needed from each food group. However, controlling the total amount of carbohydrates in your meal or snack is more important than making sure you  include all of the food groups at every meal. You may interchange carbohydrate containing foods (dairy, starches, and fruits). The meal plan below is an example of a 2000 calorie diet using carbohydrate counting. This meal plan has 17 carbohydrate servings. Breakfast  1 cup oatmeal (2 carb servings).   cup light yogurt (1 carb serving).  1 cup blueberries (1 carb serving).   cup almonds. Snack  1 large apple (2 carb servings).  1 low-fat string cheese stick. Lunch  Chicken breast salad.  1 cup spinach.   cup chopped tomatoes.  2 oz chicken breast, sliced.  2 tbs low-fat [place] dressing.  12 whole-wheat crackers (2 carb servings).  12 to 15 grapes (1 carb serving).  1 cup low-fat milk (1 carb serving). Snack  1 cup carrots.   cup hummus (1 carb serving). Dinner  3 oz broiled salmon.  1 cup brown rice (3 carb servings). Snack  1  cups steamed broccoli (1 carb serving) drizzled with 1 tsp olive oil and lemon juice.  1 cup light pudding (2 carb servings). DIABETES MEAL PLANNING WORKSHEET Your dietician can use this worksheet to help you decide how many servings of foods and what types of foods are right for you.  BREAKFAST Food Group and Servings / Carb Servings Grain/Starches __________________________________ Dairy __________________________________________ Vegetable ______________________________________ Fruit ___________________________________________ Meat __________________________________________ Fat ____________________________________________ LUNCH Food Group and Servings / Carb Servings Grain/Starches ___________________________________ Dairy ___________________________________________ Fruit ____________________________________________ Meat ___________________________________________ Fat _____________________________________________ [place] Food Group and Servings / Carb Servings Grain/Starches ___________________________________ Dairy  ___________________________________________ Fruit ____________________________________________ Meat ___________________________________________ Fat _____________________________________________ SNACKS Food Group and Servings / Carb Servings Grain/Starches ___________________________________ Dairy ___________________________________________ Vegetable _______________________________________ Fruit ____________________________________________ Meat ___________________________________________ Fat _____________________________________________ DAILY TOTALS Starches _________________________ Vegetable ________________________ Fruit ____________________________ Dairy ____________________________ Meat ____________________________ Fat ______________________________ Document Released: 02/05/2005 Document Revised: 08/03/2011 Document Reviewed: 12/17/2008 [employer] Patient Information 2014 [employer].  Diabetes, Eating Away From Home Sometimes, you might eat in a restaurant or have meals that are prepared by someone else. You can enjoy eating out. However, the portions in restaurants may be much larger than needed. Listed below are some ideas to help you choose foods that will keep your blood glucose (sugar) in better control.  TIPS FOR EATING OUT  Know your meal plan and how many carbohydrate servings you should have at each meal. You may wish to carry a copy of your meal plan in your purse or wallet. Learn the foods included in each food group.  Make a list of restaurants near you that offer healthy choices. Take a copy of the carry-out menus to see what they offer. Then, you can plan what you will order ahead of time.  Become  familiar with serving sizes by practicing them at home using measuring cups and spoons. Once you learn to recognize portion sizes, you will be able to correctly estimate the amount of total carbohydrate you are allowed to eat at the restaurant. Ask for a takeout box if the  portion is more than you should have. When your food comes, leave the amount you should have on the plate, and put the rest in the takeout box before you start eating.  Plan ahead if your mealtime will be different from usual. Check with your caregiver to find out how to time meals and medicine if you are taking insulin.  Avoid high-fat foods, such as fried foods, cream sauces, high-fat salad dressings, or any added butter or margarine.  Do not be afraid to ask questions. Ask your server about the portion size, cooking methods, ingredients and if items can be substituted. Restaurants do not list all available items on the menu. You can ask for your main entree to be prepared using skim milk, oil instead of butter or margarine, and without gravy or sauces. Ask your waiter or waitress to serve salad dressings, gravy, sauces, margarine, and sour cream on the side. You can then add the amount your meal plan suggests.  Add more vegetables whenever possible.  Avoid items that are labeled "jumbo," "giant," "deluxe," or "supersized."  You may want to split an entre with someone and order an extra side salad.  Watch for hidden calories in foods like croutons, bacon, or cheese.  Ask your server to take away the bread basket or chips from your table.  Order a dinner salad as an appetizer. You can eat most foods served in a restaurant. Some foods are better choices than others. Breads and Starches  Recommended: All kinds of bread (wheat, rye, white, oatmeal, [place], [place], raisin), hard or soft dinner rolls, frankfurter or hamburger buns, small bagels, small corn or whole-wheat flour tortillas.  Avoid: Frosted or glazed breads, butter rolls, egg or cheese breads, croissants, sweet rolls, pastries, coffee cake, glazed or frosted doughnuts, muffins. Crackers  Recommended: Animal crackers, graham, rye, saltine, oyster, and matzoth crackers. Bread sticks, melba toast, rusks, pretzels, popcorn (without  fat), zwieback toast.  Avoid: High-fat snack crackers or chips. Buttered popcorn. Cereals  Recommended: Hot and cold cereals. Whole grains such as oatmeal or shredded wheat are good choices.  Avoid: Sugar-coated or granola type cereals. Potatoes/Pasta/Rice/Beans  Recommended: Order baked, boiled, or mashed potatoes, rice or noodles without added fat, whole beans. Order gravies, butter, margarine, or sauces on the side so you can control the amount you add.  Avoid: Hash browns or fried potatoes. Potatoes, pasta, or rice prepared with cream or cheese sauce. Potato or pasta salads prepared with large amounts of dressing. Fried beans or fried rice. Vegetables  Recommended: Order steamed, baked, boiled, or stewed vegetables without sauces or extra fat. Ask that sauce be served on the side. If vegetables are not listed on the menu, ask what is available.  Avoid: Vegetables prepared with cream, butter, or cheese sauce. Fried vegetables. Salad Bars  Recommended: Many of the vegetables at a salad bar are considered "free." Use lemon juice, vinegar, or low-calorie salad dressing (fewer than 20 calories per serving) as "free" dressings for your salad. Look for salad bar ingredients that have no added fat or sugar such as tomatoes, lettuce, cucumbers, broccoli, carrots, onions, and mushrooms.  Avoid: Prepared salads with large amounts of dressing, such as coleslaw, caesar salad, macaroni salad, bean  salad, or carrot salad. Fruit  Recommended: Eat fresh fruit or fresh fruit salad without added dressing. A salad bar often offers fresh fruit choices, but canned fruit at a restaurant is usually packed in sugar or syrup.  Avoid: Sweetened canned or frozen fruits, plain or sweetened fruit juice. Fruit salads with dressing, sour cream, or sugar added to them. Meat and Meat Substitutes  Recommended: Order broiled, baked, roasted, or grilled meat, poultry, or fish. Trim off all visible fat. Do not eat the  skin of poultry. The size stated on the menu is the raw weight. Meat shrinks by  in cooking (for example, 4 oz raw equals 3 oz cooked meat).  Avoid: Deep-fat fried meat, poultry, or fish. Breaded meats. Eggs  Recommended: Order soft, hard-cooked, poached, or scrambled eggs. Omelets may be okay, depending on what ingredients are added. Egg substitutes are also a good choice.  Avoid: Fried eggs, eggs prepared with cream or cheese sauce. Milk  Recommended: Order low-fat or fat-free milk according to your meal plan. Plain, nonfat yogurt or flavored yogurt with no sugar added may be used as a substitute for milk. Soy milk may also be used.  Avoid: Milk shakes or sweetened milk beverages. Soups and Combination Foods  Recommended: Clear broth or consomm are "free" foods and may be used as an appetizer. Broth-based soups with fat removed count as a starch serving and are preferred over cream soups. Soups made with beans or split peas may be eaten but count as a starch.  Avoid: Fatty soups, soup made with cream, cheese soup. Combination foods prepared with excessive amounts of fat or with cream or cheese sauces. Desserts and Sweets  Recommended: Ask for fresh fruit. Sponge or angel food cake without icing, ice milk, no sugar added ice cream, sherbet, or frozen yogurt may fit into your meal plan occasionally.  Avoid: Pastries, puddings, pies, cakes with icing, custard, gelatin desserts. Fats and Oils  Recommended: Choose healthy fats such as olive oil, canola oil, or tub margarine, reduced fat or fat-free sour cream, cream cheese, avocado, or nuts.  Avoid: Any fats in excess of your allowed portion. Deep-fried foods or any food with a large amount of fat. Note: Ask for all fats to be served on the side, and limit your portion sizes according to your meal plan. Document Released: 05/11/2005 Document Revised: 08/03/2011 Document Reviewed: 11/29/2008 [facility] Patient Information 2014 [place],  [place].  Diabetic Ketoacidosis Diabetic ketoacidosis (DKA) is a life-threatening complication of type 1 diabetes. It must be quickly recognized and treated. Treatment requires hospitalization. CAUSES  When there is no insulin in the body, glucose (sugar) cannot be used and the body breaks down fat for energy. When fat breaks down, acids (ketones) build up in the blood. Very high levels of glucose and high levels of acids lead to severe loss of body fluids (dehydration) and other dangerous chemical changes. This stresses your vital organs and can cause coma or death. SYMPTOMS   Tiredness (fatigue).  Weight loss.  Excessive thirst.  Ketones in the urine.  Lightheadedness.  Fruity or sweet smell on your breath.  Excessive urination.  Visual changes.  Confusion or irritability.  Feeling sick to your stomach (nauseous) or vomiting.  Rapid breathing.  Stomachache or belly (abdominal) pain. DIAGNOSIS  Your caregiver will diagnose DKA based on your history, physical exam, and blood tests. Your caregiver will check if there is another illness present which caused you to go into DKA. Most of this will be done  quickly in an emergency room. TREATMENT   Fluid replacement to correct dehydration.  Insulin.  Correction of electrolytes, such as potassium and sodium.  Medicines (antibiotics) that kill germs for infections. PREVENTION  Always take your insulin. Do not skip your insulin injections.  If you are ill, treat yourself quickly. Your body often needs more insulin to fight the illness.  Check your blood glucose regularly.  Check urine ketones if your blood glucose is greater than 240 milligrams per deciliter (mg/dl).  Do not used expired or outdated insulin.  If your blood glucose is high, drink plenty of fluids. This helps flush out ketones. HOME CARE INSTRUCTIONS   If you are ill, follow the advice of your caregiver.  To prevent loss of body fluids (dehydration), drink  enough water and fluids to keep your urine clear or pale yellow.  If you cannot eat, alternate between drinking fluids with sugar (soda, juices, flavored gelatin) and salty fluids (broth, bouillon).  If you can eat, follow your usual diet and drink sugar-free liquids (water, diet drinks).  Always take your usual dose of insulin. If you cannot eat, or your glucose is getting too low, call your caregiver for further instructions.  Continue to monitor your blood or urine ketones every 3 to 4 hours around the clock. Set your alarm clock or have someone wake you up. If you are too sick, have someone test it for you.  Rest and avoid exercise. SEEK MEDICAL CARE IF:   You have ketones in your urine or your blood glucose is higher than a level your caregiver suggests. You may need extra insulin. Call your caregiver if you need advice on adjusting your insulin.  You cannot drink at least a tablespoon of fluid every 15 to 20 minutes.  You have been throwing up for more than 2 hours.  You have symptoms of DKA:  Fruity smelling breath.  Breathing faster or slower.  Becoming very sleepy. SEEK IMMEDIATE MEDICAL CARE IF:   You have signs of dehydration:  Decreased urination.  Increased thirst.  Dry skin and mouth.  Lightheadedness.  Your blood glucose is very high (as advised by your caregiver) twice in a row.  You or your child has an oral temperature above 102 F (38.9 C), not controlled by medicine.  You pass out.  You have chest pain and/or trouble breathing.  You have a sudden, severe headache.  You have sudden weakness in one arm and/or one leg.  You have sudden difficulty speaking and/or swallowing.  You develop vomiting and/or diarrhea that is getting worse after 3 to 4 hours.  You have abdominal pain. MAKE SURE YOU:   Understand these instructions.  Will watch your condition.  Will get help right away if you are not doing well or get worse. Document Released:  05/08/2000 Document Revised: 08/03/2011 Document Reviewed: 11/14/2008 [facility] Patient Information 2014 [place], [place].  How to Increase Fiber in the Meal Plan for Diabetes Increasing fiber in the diet has many benefits including lowering blood cholesterol, helping to control blood glucose (sugar), preventing constipation, and aiding in weight management by helping you feel full longer. Start adding fiber to your diet slowly. A gradual substitution of high-fiber foods for low-fiber foods will allow the digestive tract to adjust. Most men under 9 years of age should aim to eat 38 g of fiber a day. Women should aim for 25 g. Over 54 years of age, most men need 30 g of fiber and most women need 21  g. Below are some suggestions for increasing fiber.  Try whole-wheat bread instead of white bread. Look for words high on the list of ingredients, such as whole wheat, whole rye, or whole oats.  Try a baked potato with skin instead of mashed potatoes.  Try a fresh apple with skin instead of applesauce.  Try a fresh orange instead of orange juice.  Try popcorn instead of potato chips.  Try bran cereal instead of corn flakes.  Try kidney, whole pinto, or garbanzo beans instead of bread.  Try whole-grain crackers instead of saltine crackers.  Try whole-wheat pasta instead of regular varieties. While on a high-fiber diet:   Drink enough water and fluids to keep your urine clear or pale yellow.  Eat a variety of high fiber foods such as fruits, vegetables, whole grains, nuts, and seeds.  Aim for 5 servings of fruit and vegetables per day.  Try to increase your intake of fiber by eating high-fiber foods instead of taking fiber supplements that contain small amounts of fiber. There can be additional benefits for long-term health and blood glucose control with high-fiber foods.  SOURCES OF FIBER The following list shows the average dietary fiber for types of food in the various food  groups. Starches and Breads / Dietary Fiber (g)  Whole-grain bread, 1 slice / 2 g  Whole-grain cereals,  cup / 3 g  Bran cereals,  to  cup / 8 g  Starchy vegetables,  cup / 3 g  Oatmeal,  cup / 2 g  Whole-wheat pasta,  cup / 2 g  Brown rice,  cup / 2 g  Barley,  cup / 3 g Legumes / Dietary Fiber (g)  Beans,  cup / 8 g  Peas,  cup / 8 g  Lentils ,  cup / 8 g Meat and Meat Substitutes / Dietary Fiber (g) This group averages 0 grams of fiber. Exceptions are:  Nuts, seeds, 1 oz or  cup / 3 g  Chunky peanut butter, 2 tbs / 3 g Vegetables / Dietary Fiber (g)  Cooked vegetables,  to  cup / 2 to 3 g  Raw vegetables, 1 to 2 cups / 2 to 3 g Fruit / Dietary Fiber (g)  Raw or cooked fruit,  cup or 1 small, fresh piece / 2 g Milk / Dietary Fiber (g)  Milk, 1 cup or 8 oz / 0 g Fats and Oils / Dietary Fiber (g)  Fats and oils, 1 tsp / 0 g You can determine how much fiber you are eating by reading the Nutrition Facts panel on the labels of the foods you eat. FIBER IN SPECIFIC FOODS Cereals / Dietary Fiber (g)  All Bran,  cup / 9 g  All Bran with Extra Fiber,  cup / 13 g  Bran Flakes,  cup / 4 g  Cheerios,  cup / 1.5 g  Corn Bran,  cup / 4 g  Corn Flakes,  cup / 0.75 g  Cracklin' Oat Bran,  cup / 4 g  Fiber One,  cup / 13 g  Grape Nuts, 3 tbs / 3 g  Grape Nuts Flakes,  cup / 3 g  Noodles,  cup, cooked / 0.5 g  Nutrigrain Wheat,  cup / 3.5 g  Oatmeal,  cup, cooked / 1.1 g  Pasta, white (macaroni, spaghetti),  cup, cooked / 0.5 g  Pasta, whole-wheat (macaroni, spaghetti),  cup, cooked / 2 g  [place],  cup, cooked / 3 g  Rice, wild,  cup, cooked / 0.5 g  Rice, brown,  cup, cooked / 1 g  Rice, white,  cup, cooked / 0.2 g  Shredded Wheat, bite-sized,  cup / 2 g  Total,  cup / 1.75 g  Wheat Chex,  cup / 2.5 g  [place],  cup, cooked / 4 g  Wheaties,  cup / 2.75 g Bread, Starchy Vegetables, and  Dried Peas and Beans / Dietary Fiber (g)  Bagel, whole / 0.6 g  Baked beans in tomato sauce,  cup, cooked / 3 g  Bran muffin, 1 small / 2.5 g  Bread, cracked wheat, 1 slice / 2.5 g  Bread, pumpernickel, 1 slice / 2.5 g  Bread, white, 1 slice / 0.4 g  Bread, whole-wheat, 1 slice / 1.4 g  Corn,  cup, canned / 2.9 g  Kidney beans,  cup, cooked / 3.5 g  Lentils, cup, cooked / 3 g  Lima beans,  cup, cooked / 4 g  Navy beans,  cup, cooked / 4 g  Peas,  cup, cooked / 4 g  Popcorn, 3 cups popped, unbuttered / 3.5 g  Potato, baked (with skin), 1 small / 4 g  Potato, baked (without skin), 1 small / 2 g  Ry-Krisp, 4 crackers / 3 g  Saltine crackers, 6 squares / 0 g  Split peas,  cup, cooked / 2.5 g  Yams (sweet potato),  cup / 1.7 g Fruit / Dietary Fiber (g)  Apple, 1 small, fresh, with skin / 4 g  Apple juice,  cup / 0.4 g  Apricots, 4 medium, fresh / 4 g  Apricots, 7 halves, dried / 2 g  Banana,  medium / 1.2 g  Blueberries,  cup / 2 g  Cantaloupe, melon / 1.3 g  Cherries,  cup, canned / 1.4 g  Grapefruit,  medium / 1.6 g  Grapes, 15 small / 1.2 g  Grape juice,  cup / 0.5 g  Orange, 1 medium, fresh / 2 g  Orange juice,  cup / 0.5 g  Peach, 1 medium,fresh, with skin / 2 g  Pear, 1 medium, fresh, with skin / 4 g  Pineapple, cup, canned / 0.7 g  Plums, 2 whole / 2 g  Prunes, 3 whole / 1.5 g  Raspberries, 1 cup / 6 g  Strawberries, 1  cup / 4 g  Watermelon, 1  cup / 0.5 g Vegetables / Dietary Fiber (g)  Asparagus,  cup, cooked / 1 g  Beans, green and wax,  cup, cooked / 1.6 g  Beets,  cup, cooked / 1.8 g  Broccoli,  cup, cooked / 2.2 g  Brussels sprouts,  cup, cooked / 4 g  Cabbage,  cup, cooked / 2.5 g  Carrots,  cup, cooked / 2.3 g  Cauliflower,  cup, cooked / 1.1 g  Celery, 1 cup, raw / 1.5 g  Cucumber, 1 cup, raw / 0.8 g  Green pepper,  cup sliced, cooked / 1.5 g  Lettuce, 1 cup, sliced / 0.9  g  Mushrooms, 1 cup sliced, raw / 1.8 g  Onion, 1 cup sliced, raw / 1.6 g  Spinach,  cup, cooked / 2.4 g  Tomato, 1 medium, fresh / 1.5 g  Tomato juice,  cup / 0 g  Zucchini,  cup, cooked / 1.8 g Document Released: 10/31/2001 Document Revised: 09/05/2012 Document Reviewed: 11/27/2008 [facility] Patient Information 2014 [place], [place].  Serving Sizes What we call a serving size today  is larger than it was in the past. A 1950s fast-food burger contained little more than 1 oz of meat, and a soft drink was 8 oz (1 cup). Today, a "quarter pounder" burger is at least 4 times that amount, and a 32 or 64 oz drink is not uncommon. A possible guide for eating when trying to lose weight is to eat about half as much as you normally do. Some estimates of serving sizes are:  1 Dairy serving:Individual container of yogurt (8 oz) or piece of cheese the size of your thumb (1 oz).  1 Grain serving: 1 slice of bread or  cup pasta.  1 Meat serving: The size of a deck of cards (3 oz).  1 Fruit serving: cup canned fruit or 1 medium fruit.  1 Vegetable serving:  cup of cooked or canned vegetables.  1 Fat serving:The size of 4 stacked dimes. Experts suggest spending 1 or 2 days measuring food portions you commonly eat. This will give you better practice at estimating serving sizes, and will also show whether you are eating an appropriate amount of food to meet your weight goals. If you find that you are eating more than you thought, try measuring your food for a few days so you can "reprogram" yourself to learn what makes a healthy portion for you. SUGGESTIONS FOR CONTROL  In restaurants, share entrees, or ask the waiter to put half the entre in a box or bag before you even touch it.  Order lunch-sized portions. Many restaurants serve 4 to 6 oz of meat at lunch, compared with 8 to 10 oz at dinner.  Split dessert or skip it all together. Have a piece of fruit when you get home.  At home, use  smaller plates and bowls. It will look as if you are eating more.  Plate your food in the kitchen rather than serving it "family style" at the table.  Wait 20 to 30 minutes before taking seconds. This is how long it takes your brain to recognize that you are full.  Check food labels for serving sizes. Eat 1 serving only.  Use measuring cups and spoons to see proper serving sizes.  Buy smaller packages of candy, popcorn, and snacks.  Avoid eating directly out of the bag or carton.  While eating half as much, exercise twice as much. Park further away from the mall, take the stairs instead of the escalator, and walk around your block. Losing weight is a slow, difficult process. It takes long-lasting lifestyle changes. You can make gradual changes over time so they become habits. Look to friends and family to support the healthy changes you are making. Avoid fad diets since they are often only temporary weight loss solutions. Document Released: 02/07/2003 Document Revised: 08/03/2011 Document Reviewed: 03/19/2009 [facility] Patient Information 2014 [place], [place].  Diets for Diabetes, Food Labeling Look at food labels to help you decide how much of a product you can eat. You will want to check the amount of total carbohydrate in a serving to see how the food fits into your meal plan. In the list of ingredients, the ingredient present in the largest amount by weight must be listed first, followed by the other ingredients in descending order. STANDARD OF IDENTITY Most products have a list of ingredients. However, foods that the Food and Drug Administration (FDA) has given a standard of identity do not need a list of ingredients. A standard of identity means that a food must contain certain ingredients if  it is called a particular name. Examples are mayonnaise, peanut butter, ketchup, jelly, and cheese. LABELING TERMS There are many terms found on food labels. Some of these terms have specific  definitions. Some terms are regulated by the FDA, and the FDA has clearly specified how they can be used. Others are not regulated or well-defined and can be misleading and confusing. SPECIFICALLY DEFINED TERMS Nutritive Sweetener.  A sweetener that contains calories,such as table sugar or honey. Nonnutritive Sweetener.  A sweetener with few or no calories,such as saccharin, aspartame, sucralose, and cyclamate. LABELING TERMS REGULATED BY THE FDA Free.  The product contains only a tiny or small amount of fat, cholesterol, sodium, sugar, or calories. For example, a "fat-free" product will contain less than 0.5 g of fat per serving. Low.  A food described as "low" in fat, saturated fat, cholesterol, sodium, or calories could be eaten fairly often without exceeding dietary guidelines. For example, "low in fat" means no more than 3 g of fat per serving. Lean.  "Lean" and "extra lean" are U.S. Department of Agriculture Scientist, research (physical sciences)) terms for use on meat and poultry products. "Lean" means the product contains less than 10 g of fat, 4 g of saturated fat, and 95 mg of cholesterol per serving. "Lean" is not as low in fat as a product labeled "low." Extra Lean.  "Extra lean" means the product contains less than 5 g of fat, 2 g of saturated fat, and 95 mg of cholesterol per serving. While "extra lean" has less fat than "lean," it is still higher in fat than a product labeled "low." Reduced, Less, Fewer.  A diet product that contains 25% less of a nutrient or calories than the regular version. For example, hot dogs might be labeled "25% less fat than our regular hot dogs." Light/Lite.  A diet product that contains  fewer calories or  the fat of the original. For example, "light in sodium" means a product with  the usual sodium. More.  One serving contains at least 10% more of the daily value of a vitamin, mineral, or fiber than usual. Good Source Of.  One serving contains 10% to 19% of the daily  value for a particular vitamin, mineral, or fiber. Excellent Source Of.  One serving contains 20% or more of the daily value for a particular nutrient. Other terms used might be "high in" or "rich in." Enriched or Fortified.  The product contains added vitamins, minerals, or protein. Nutrition labeling must be used on enriched or fortified foods. Imitation.  The product has been altered so that it is lower in protein, vitamins, or minerals than the usual food,such as imitation peanut butter. Total Fat.  The number listed is the total of all fat found in a serving of the product. Under total fat, food labels must list saturated fat and trans fat, which are associated with raising bad cholesterol and an increased risk of heart blood vessel disease. Saturated Fat.  Mainly fats from animal-based sources. Some examples are red meat, cheese, cream, whole milk, and coconut oil. Trans Fat.  Found in some fried snack foods, packaged foods, and fried restaurant foods. It is recommended you eat as close to 0 g of trans fat as possible, since it raises bad cholesterol and lowers good cholesterol. Polyunsaturated and Monounsaturated Fats.  More healthful fats. These fats are from plant sources. Total Carbohydrate.  The number of carbohydrate grams in a serving of the product. Under total carbohydrate are listed the other carbohydrate sources,  such as dietary fiber and sugars. Dietary Fiber.  A carbohydrate from plant sources. Sugars.  Sugars listed on the label contain all naturally occurring sugars as well as added sugars. LABELING TERMS NOT REGULATED BY THE FDA Sugarless.  Table sugar (sucrose) has not been added. However, the manufacturer may use another form of sugar in place of sucrose to sweeten the product. For example, sugar alcohols are used to sweeten foods. Sugar alcohols are a form of sugar but are not table sugar. If a product contains sugar alcohols in place of sucrose, it can  still be labeled "sugarless." Low Salt, Salt-Free, Unsalted, No Salt, No Salt Added, Without Added Salt.  Food that is usually processed with salt has been made without salt. However, the food may contain sodium-containing additives, such as preservatives, leavening agents, or flavorings. Natural.  This term has no legal meaning. Organic.  Foods that are certified as organic have been inspected and approved by the USDA to ensure they are produced without pesticides, fertilizers containing synthetic ingredients, bioengineering, or ionizing radiation. Document Released: 05/14/2003 Document Revised: 08/03/2011 Document Reviewed: 11/29/2008 [facility] Patient Information 2014 [place], [place]. Daily Diabetes Record Week of _____________________________ Date: _________  Elita Boone, BG/Medications: ________________ / __________________________________________________________  LUNCH, BG/Medications: ____________________ / __________________________________________________________  Curtis Hunt, BG/Medications: ___________________ / __________________________________________________________  BEDTIME, BG/Medications: __________________ / __________________________________________________________ Date: _________  Elita Boone, BG/Medications: ________________ / __________________________________________________________  LUNCH, BG/Medications: ____________________ / __________________________________________________________  Curtis Hunt, BG/Medications: ___________________ / __________________________________________________________  BEDTIME, BG/Medications: __________________ / __________________________________________________________ Date: _________  Elita Boone, BG/Medications: ________________ / __________________________________________________________  LUNCH, BG/Medications: ____________________ / __________________________________________________________  Curtis Hunt, BG/Medications: ___________________ /  __________________________________________________________  BEDTIME, BG/Medications: __________________ / __________________________________________________________ Date: _________  Elita Boone, BG/Medications: ________________ / __________________________________________________________  LUNCH, BG/Medications: ____________________ / __________________________________________________________  Curtis Hunt, BG/Medications: ___________________ / __________________________________________________________  BEDTIME, BG/Medications: __________________ / __________________________________________________________ Date: _________  Elita Boone, BG/Medications: ________________ / __________________________________________________________  LUNCH, BG/Medications: ____________________ / __________________________________________________________  Curtis Hunt, BG/Medications: ___________________ / __________________________________________________________  BEDTIME, BG/Medications: __________________ / __________________________________________________________ Date: _________  Elita Boone, BG/Medications: ________________ / __________________________________________________________  LUNCH, BG/Medications: ____________________ / __________________________________________________________  Curtis Hunt, BG/Medications: ___________________ / __________________________________________________________  BEDTIME, BG/Medications: __________________ / __________________________________________________________ Date: _________  Elita Boone, BG/Medications: ________________ / __________________________________________________________  LUNCH, BG/Medications: ____________________ / __________________________________________________________  Curtis Hunt, BG/Medications: ___________________ / __________________________________________________________  BEDTIME, BG/Medications: __________________ /  __________________________________________________________ Notes: __________________________________________________________________________________________________ Document Released: 04/14/2004 Document Revised: 08/03/2011 Document Reviewed: 02/06/2009 [employer] Patient Information 2014 [employer] Blood Glucose Monitoring, Adult Monitoring your blood glucose (also know as blood sugar) helps you to manage your diabetes. It also helps you and your health care provider monitor your diabetes and determine how well your treatment plan is working. WHY SHOULD YOU MONITOR YOUR BLOOD GLUCOSE?  It can help you understand how food, exercise, and medicine affect your blood glucose.  It allows you to know what your blood glucose is at any given moment. You can quickly tell if you are having low blood glucose (hypoglycemia) or high blood glucose (hyperglycemia).  It can help you and your health care provider know how to adjust your medicines.  It can help you understand how to manage an illness or adjust medicine for exercise. WHEN SHOULD YOU TEST? Your health care provider will help you decide how often you should check your blood glucose. This may depend on the type of diabetes you have, your diabetes control, or the types of medicines you are taking. Be sure to write down all of your blood glucose readings so that this information can be reviewed with your health care provider. See below for examples of testing times that  your health care provider may suggest. Type 1 Diabetes  Test 4 times a day if you are in good control, using an insulin pump, or perform multiple daily injections.  If your diabetes is not well-controlled or if you are sick, you may need to monitor more often.  It is a good idea to also monitor:  Before and after exercise.  Between meals and 2 hours after a meal.  Occasionally between 2:00 to 3:00 am. Type 2 Diabetes  It can vary with each person, but generally, if you  are on insulin, test 4 times a day.  If you take medicines by mouth (orally), test 2 times a day.  If you are on a controlled diet, test once a day.  If your diabetes is not well controlled or if you are sick, you may need to monitor more often. HOW TO MONITOR YOUR BLOOD GLUCOSE Supplies Needed  Blood glucose meter.  Test strips for your meter. Each meter has its own strips. You must use the strips that go with your own meter.  A pricking needle (lancet).  A device that holds the lancet (lancing device).  A journal or log book to write down your results. Procedure  Wash your hands with soap and water. Alcohol is not preferred.  Prick the side of your finger (not the tip) with the lancet.  Gently milk the finger until a small drop of blood appears.  Follow the instructions that come with your meter for inserting the test strip, applying blood to the strip, and using your blood glucose meter. Other Areas to Get Blood for Testing Some meters allow you to use other areas of your body (other than your finger) to test your blood. These areas are called alternative sites. The most common alternative sites are:  The forearm.  The thigh.  The back area of the lower leg.  The palm of the hand. The blood flow in these areas is slower. Therefore, the blood glucose values you get may be delayed, and the numbers are different from what you would get from your fingers. Do not use alternative sites if you think you are having hypoglycemia. Your reading will not be accurate. Always use a finger if you are having hypoglycemia. Also, if you cannot feel your lows (hypoglycemia unawareness), always use your fingers for your blood glucose checks. ADDITIONAL TIPS FOR GLUCOSE MONITORING  Do not reuse lancets.  Always carry your supplies with you.  All blood glucose meters have a 24-hour "hotline" number to call if you have questions or need help.  Adjust (calibrate) your blood glucose meter  with a control solution after finishing a few boxes of strips. BLOOD GLUCOSE RECORD KEEPING It is a good idea to keep a daily record or log of your blood glucose readings. Most glucose meters, if not all, keep your glucose records stored in the meter. Some meters come with the ability to download your records to your home computer. Keeping a record of your blood glucose readings is especially helpful if you are wanting to look for patterns. Make notes to go along with the blood glucose readings because you might forget what happened at that exact time. Keeping good records helps you and your health care provider to work together to achieve good diabetes management.  Document Released: 05/14/2003 Document Revised: 01/11/2013 Document Reviewed: 10/03/2012 [facility] Patient Information 2014 [place].

## 2013-06-15 NOTE — Telephone Encounter (Signed)
Meds refilled.

## 2013-06-15 NOTE — Discharge Summary (Signed)
Physician Discharge Summary  Curtis Hunt [id number] DOB: 06-04-1950 DOA: 06/08/2013  PCP: Odette Fraction, MD  Admit date: 06/08/2013 Discharge date: 06/15/2013  Time spent: 45 minutes  Recommendations for Outpatient Follow-up:  1. Followup with primary care physician optimally within one week after discharge 2. Followup with [facility]; they will call with appointment date and time 3. CT angiogram abdomen and pelvis this admission revealed incidental finding of a subcentimeter right lower lobe pleural nodule which will need additional followup in the next 6-12 months 4. Same CT scan revealed increased density within the jejunum mesenteric fat with small nodes within which were suspicious for mesenteric adenitis/panniculitis. Patient without abdominal pain or diarrhea  Discharge Diagnoses:  Active Problems:   Newly diagnosed diabetes/new start insulin   DKA (diabetic ketoacidoses)-resolved   Acute renal failure-resolved   Rhabdomyolysis-resolving   Hypernatremia-resolved   Weight loss due to extreme water deficit-resolved   Jerking movements of extremities/hypophosphatemia-resolved   Dehydration-resolved   Metabolic encephalopathy-resolved   Oral thrush-resolved   GERD (gastroesophageal reflux disease)   Dyslipidemia   ? Jejunal adenitis/panniculitis-asymptomatic   Subcentimeter right lower lobe pleural nodule   Discharge Condition: stable  Diet recommendation: Carbohydrate modified  Filed Weights   06/12/13 2013 06/13/13 2136 06/14/13 2147  Weight: 238 lb (107.956 kg) 238 lb (107.956 kg) 238 lb (107.956 kg)    History of present illness:  63 year old male patient with only significant past medical history of dyslipidemia and prostate cancer. Was sent to the ER by his primary care physician after laboratory data revealed extremely elevated serum glucose readings.   In the emergency department his serum glucose was greater than 1400,  his CPK was around 2700. His sodium was normal, he was hyperkalemic at 6.0, he also had acute renal failure with a BUN of 87 and a creatinine of 3.04.    Hospital Course:  DKA in Newly diagnosed diabetes  -HgbA1c = 15.6 - Started on Levemir twice a day this admission with controlled glucoses noted at time of discharge-continue after discharge -Started on meal coverage this admission and will continue after - consider add Metformin to improve insulin sensitization and possibly decrease insulin doses-likely as OP-previous rhabdomyolysis needs to be resolved (see below) - consulted diabetes educator-rec OP Diabetes FU at clinic/ordered  - Demonstrated competence regarding self administered insulin injections prior to discharge -Educational materials provided at discharge  Acute renal failure  -Resolved after hydration -renal US revealed "dromedary hump" vs mass left kidney-CT angiogram of abdomen and pelvis revealed no renal abnormality  Rhabdomyolysis  -CPK 24 hours prior to presentation was 501 and on date of admission was 2716. Peaked at [address]. On date of discharge was down to 362. -will need to be completely resolved before can begin Metformin   Weight loss/Dehydration/hypernatremia  -Initial water deficit calculated at 24 L  -Now euvolemic discharge weight 238 lbs  Jerking movements of extremities/hypophosphatemia -suspected due to electrolyte disturbance  - repleted phosphorus initially since was low-phos 2.7 after replete  - Mg was 3.0   Metabolic encephalopathy  - resolved  -primarily due to dehydration, hypernatremia and hyperglycemia  -EEG unremarkable re: seizure  -MRI neg   Oral thrush  -2/2 prolonged hyperglycemia  -Nystatin S/S completed   Procedures:  EEG: No seizure activity seen  Consultations:  None  Discharge Exam: Filed Vitals:   06/15/13 0745  BP: 132/77  Pulse: 78  Temp: 98.5 F (36.9 C)  Resp: 19   General: No acute respiratory distress  Lungs: Clear to auscultation bilaterally without wheezes or crackles, RA  Cardiovascular: Regular rate and rhythm without murmur gallop or rub normal S1 and S2, no peripheral edema  Abdomen: Nontender, nondistended, soft, bowel sounds positive, no rebound, no ascites, no appreciable mass  Musculoskeletal: No significant cyanosis, clubbing of bilateral lower extremities  Neurological: Alert and oriented, moves all extremities x 4 without focal neurological deficits    Discharge Instructions      Discharge Orders   Future Appointments Provider Department Dept Phone   08/01/2013 9:30 AM Anne Shutter, [facility] [phone]   Future Orders Complete By Expires   Ambulatory referral to Nutrition and Diabetic Education  As directed    Call MD for:  difficulty breathing, headache or visual disturbances  As directed    Call MD for:  extreme fatigue  As directed    Call MD for:  persistant dizziness or light-headedness  As directed    Call MD for:  persistant nausea and vomiting  As directed    Call MD for:  temperature >100.4  As directed    Diet Carb Modified  As directed    For home use only DME Glucometer  As directed    Comments:     Purchase device that uses [employer] test strips   Increase activity slowly  As directed        Medication List    STOP taking these medications       nystatin 100000 UNIT/ML suspension  Commonly known as:  MYCOSTATIN      TAKE these medications       aspirin 81 MG tablet  Take 81 mg by mouth daily.     Febuxostat 80 MG Tabs  Take 80 mg by mouth daily.     glucose blood test strip  Use as instructed     insulin aspart 100 UNIT/ML injection  Commonly known as:  novoLOG  Inject 16 Units into the skin 3 (three) times daily with meals.     insulin aspart cartridge  Commonly known as:  NOVOLOG  Inject 16 Units into the skin 3 (three) times daily with meals.     Insulin Detemir 100 UNIT/ML Pen   Commonly known as:  LEVEMIR FLEXTOUCH  Inject 60 Units into the skin daily at 10 pm.     Insulin Detemir 100 UNIT/ML Pen  Commonly known as:  LEVEMIR FLEXTOUCH  Inject 65 Units into the skin daily with breakfast.     Insulin Pen Needle 31G X 6 MM Misc  Use with Levemir Flextouch Pen     omeprazole 20 MG capsule  Commonly known as:  PRILOSEC  Take 20 mg by mouth daily.     rosuvastatin 40 MG tablet  Commonly known as:  CRESTOR  Take 40 mg by mouth daily.       No Known Allergies Follow-up Information   Follow up with [facility] T[facility], MD. Schedule an appointment as soon as possible for a visit in 1 week.   Specialty:  Family Medicine   Contact information:   [address] [place] [address] [phone]       Follow up with [facility]. (They will cal you with an appointment time)        The results of significant diagnostics from this hospitalization (including imaging, microbiology, ancillary and laboratory) are listed below for reference.    Significant Diagnostic Studies: Ct Head Wo  Contrast  06/09/2013   CLINICAL DATA:  Weight loss and dysphagia.  Muscle spasms  EXAM: CT HEAD WITHOUT CONTRAST  CT NECK WITHOUT CONTRAST  TECHNIQUE: Contiguous axial images were obtained from the base of the skull through the vertex without contrast. Multidetector CT imaging of the neck was performed using the standard protocol without intravenous contrast.  COMPARISON:  MRI 06/08/2013  FINDINGS: CT HEAD FINDINGS  Ventricle size is normal. Negative for acute infarct. Negative for hemorrhage or mass. Calvarium is intact.  CT NECK FINDINGS  Study is limited by lack of intravenous contrast.  Visualized paranasal sinuses are clear. Skull base is normal. Visualized intracranial contents are negative.  The tongue and tonsils are symmetric and normal. Epiglottis and larynx are normal.  Thyroid is normal. Parotid and submandibular glands are  normal bilaterally without mass.  No pathologic adenopathy in the neck. Atherosclerotic calcification in the carotid artery bilaterally. Cervical spondylosis. Lung apices are clear.  IMPRESSION: No acute intracranial abnormality.  No acute abnormality in the neck.   Electronically Signed   By: Franchot Gallo M.D.   On: 06/09/2013 15:53   Ct Soft Tissue Neck Wo Contrast  06/09/2013   CLINICAL DATA:  Weight loss and dysphagia.  Muscle spasms  EXAM: CT HEAD WITHOUT CONTRAST  CT NECK WITHOUT CONTRAST  TECHNIQUE: Contiguous axial images were obtained from the base of the skull through the vertex without contrast. Multidetector CT imaging of the neck was performed using the standard protocol without intravenous contrast.  COMPARISON:  MRI 06/08/2013  FINDINGS: CT HEAD FINDINGS  Ventricle size is normal. Negative for acute infarct. Negative for hemorrhage or mass. Calvarium is intact.  CT NECK FINDINGS  Study is limited by lack of intravenous contrast.  Visualized paranasal sinuses are clear. Skull base is normal. Visualized intracranial contents are negative.  The tongue and tonsils are symmetric and normal. Epiglottis and larynx are normal.  Thyroid is normal. Parotid and submandibular glands are normal bilaterally without mass.  No pathologic adenopathy in the neck. Atherosclerotic calcification in the carotid artery bilaterally. Cervical spondylosis. Lung apices are clear.  IMPRESSION: No acute intracranial abnormality.  No acute abnormality in the neck.   Electronically Signed   By: Franchot Gallo M.D.   On: 06/09/2013 15:53   Mr Brain Wo Contrast  06/08/2013   CLINICAL DATA:  Double vision, jerking movements in the right arm occurring yesterday. Weakness in legs. Altered mental status.  EXAM: MRI HEAD WITHOUT CONTRAST  TECHNIQUE: Multiplanar, multiecho pulse sequences of the brain and surrounding structures were obtained without intravenous contrast.  COMPARISON:  None.  FINDINGS: The patient could only  cooperate for a truncated exam. According to the technologist, patient refused to continue.  Sagittal T1 weighted images demonstrate no midline anomaly. Axial DWI series demonstrates no areas of restricted diffusion or obvious intracranial mass lesion. There is no obvious midline shift.  IMPRESSION: No acute stroke is evident. Truncated exam reveals no gross intracranial mass lesion or midline shift.   Electronically Signed   By: Rolla Flatten M.D.   On: 06/08/2013 13:38   US Renal  06/09/2013   CLINICAL DATA:  Renal failure.  EXAM: RENAL/URINARY TRACT ULTRASOUND COMPLETE  COMPARISON:  None available.  FINDINGS: Right Kidney:  Length: 11.1 cm. Echogenicity within normal limits. No mass or hydronephrosis visualized.  Left Kidney:  Length: 12.8 cm. There is a dromedary hump versus mass lesion in the mid portion of the left kidney. Triphasic CT suggests for further evaluation. Echogenicity otherwise normal.  No hydronephrosis.  Bladder:  Appears normal for degree of bladder distention.  IMPRESSION: Dromedary hump versus mass lesion central portion left kidney. Triphasic CT suggested for further evaluation.   Electronically Signed   By: Marcello Moores  Register   On: 06/09/2013 16:09   Ct Abd Wo & W Cm  06/14/2013   CLINICAL DATA:  Indeterminate left kidney on ultrasound. Mass versus dromedary hump.  EXAM: CT ABDOMEN WITHOUT AND WITH CONTRAST  TECHNIQUE: Multidetector CT imaging of the abdomen was performed following the standard protocol before and following the bolus administration of intravenous contrast.  CONTRAST:  138mL OMNIPAQUE IOHEXOL 350 MG/ML SOLN  COMPARISON:  US RENAL dated 06/09/2013  FINDINGS: Lower Chest: Pleural-based right lower lobe lung nodule of 5 mm on image 3/series 13. Normal heart size without pericardial or pleural effusion.  Abdomen: No renal calculi or other calcified lesions on unenhanced images.  No hypervascular lesions within the liver, pancreas, or kidneys on arterial phase images.   Nephrographic phase images demonstrate normal appearance of the liver. Small splenules. Normal stomach, pancreas, gallbladder, biliary tract, adrenal glands.  No evidence of renal mass or hydronephrosis. Aortic and branch vessel atherosclerosis. No retroperitoneal or retrocrural adenopathy. Scattered colonic diverticula. Normal terminal ileum and appendix. Normal abdominal small bowel loops. There is increased density within the jejunal mesenteric fat with small nodes within. Example image 84/series 11.  Bones/Musculoskeletal:  No acute osseous abnormality.  IMPRESSION: 1. No evidence of left renal mass. The ultrasound abnormality was likely artifactual or secondary to fetal lobulation. 2.  No acute abdominal process. 3. Jejunal mesenteric findings which are suspicious for mesenteric adenitis/panniculitis. 4. 5 mm right lower lobe lung nodule. If the patient is at high risk for bronchogenic carcinoma, follow-up chest CT at 6-12 months is recommended. If the patient is at low risk for bronchogenic carcinoma, follow-up chest CT at 12 months is recommended. This recommendation follows the consensus statement: "Guidelines for Management of Small Pulmonary Nodules Detected on CT Scans: A Statement from the [place]" as published in Radiology2005;237:395-400. Online at: [email or web address].arnold.com/.   Electronically Signed   By: Abigail Miyamoto M.D.   On: 06/14/2013 23:31    Microbiology: Recent Results (from the past 240 hour(s))  MRSA PCR SCREENING     Status: None   Collection Time    06/08/13  2:23 PM      Result Value Range Status   MRSA by PCR NEGATIVE  NEGATIVE Final   Comment:            The GeneXpert MRSA Assay (FDA     approved for NASAL specimens     only), is one component of a     comprehensive MRSA colonization     surveillance program. It is not     intended to diagnose MRSA     infection nor to guide or     monitor treatment for     MRSA infections.      Labs: Basic Metabolic Panel:  Recent Labs Lab 06/09/13 1055 06/09/13 1455  06/10/13 0925 06/11/13 0330 06/12/13 0325 06/13/13 0530 06/14/13 0529 06/15/13 0407  NA 162* 162*  < >  --  150* 147 144 141 139  K 4.7 4.1  < >  --  4.4 4.0 3.9 3.7 4.6  CL 127* 127*  < >  --  116* 113* 109 106 103  CO2 19 19  < >  --  21 22 23 22 23   GLUCOSE 234* 290*  < >  --  362* 285* 142* 321* 153*  BUN 78* 79*  < >  --  60* 40* 27* 22 15  CREATININE 3.01* 3.22*  < >  --  2.24* 1.69* 1.48* 1.30 1.18  CALCIUM 9.7 9.4  < >  --  7.8* 7.7* 8.0* 7.8* 8.1*  MG  --  3.0*  --   --   --   --   --   --   --   PHOS  --  1.7*  --  2.7  --   --   --   --   --   < > = values in this interval not displayed. Liver Function Tests:  Recent Labs Lab 06/13/13 0530 06/15/13 0407  AST 66* 53*  ALT 55* 51  ALKPHOS 84 78  BILITOT 0.3 <0.2*  PROT 6.6 6.1  ALBUMIN 2.6* 2.5*   No results found for this basename: LIPASE, AMYLASE,  in the last 168 hours No results found for this basename: AMMONIA,  in the last 168 hours CBC:  Recent Labs Lab 06/08/13 1600 06/13/13 0530  WBC 13.6* 6.2  HGB 15.0 11.1*  HCT 44.8 34.3*  MCV 88.2 86.2  PLT 269 146*   Cardiac Enzymes:  Recent Labs Lab 06/08/13 1600 06/09/13 1455 06/10/13 0925 06/12/13 0325 06/15/13 0500  CKTOTAL 2716* 8970* 6086* 1358* 362*   BNP: BNP (last 3 results) No results found for this basename: PROBNP,  in the last 8760 hours CBG:  Recent Labs Lab 06/14/13 1208 06/14/13 1641 06/14/13 2145 06/15/13 0748 06/15/13 1124  GLUCAP 361* 216* 109* 175* 273*       Signed:  ELLIS,ALLISON L. ANP [facility] 06/15/2013, 1:01 PM   I have examined the patient, reviewed the chart and modified the above note which I agree with.   Frankee Gritz,MD [phone] 06/21/2013, 2:25 PM

## 2013-06-16 ENCOUNTER — Other Ambulatory Visit: Payer: Self-pay | Admitting: Family Medicine

## 2013-06-21 ENCOUNTER — Ambulatory Visit: Payer: 59 | Admitting: Podiatry

## 2013-06-22 ENCOUNTER — Encounter: Payer: Self-pay | Admitting: Family Medicine

## 2013-06-22 ENCOUNTER — Ambulatory Visit (INDEPENDENT_AMBULATORY_CARE_PROVIDER_SITE_OTHER): Payer: 59 | Admitting: Family Medicine

## 2013-06-22 VITALS — BP 110/70 | HR 68 | Temp 98.6°F | Resp 18 | Ht 73.0 in | Wt 268.0 lb

## 2013-06-22 DIAGNOSIS — E1165 Type 2 diabetes mellitus with hyperglycemia: Principal | ICD-10-CM

## 2013-06-22 DIAGNOSIS — IMO0001 Reserved for inherently not codable concepts without codable children: Secondary | ICD-10-CM

## 2013-06-22 LAB — COMPLETE METABOLIC PANEL WITH GFR
ALT: 25 U/L (ref 0–53)
AST: 18 U/L (ref 0–37)
Albumin: 3.4 g/dL — ABNORMAL LOW (ref 3.5–5.2)
Alkaline Phosphatase: 63 U/L (ref 39–117)
BILIRUBIN TOTAL: 0.2 mg/dL (ref 0.2–1.2)
BUN: 17 mg/dL (ref 6–23)
CO2: 25 meq/L (ref 19–32)
CREATININE: 1.15 mg/dL (ref 0.50–1.35)
Calcium: 8.8 mg/dL (ref 8.4–10.5)
Chloride: 102 mEq/L (ref 96–112)
GFR, EST AFRICAN AMERICAN: 78 mL/min
GFR, EST NON AFRICAN AMERICAN: 67 mL/min
GLUCOSE: 158 mg/dL — AB (ref 70–99)
Potassium: 4 mEq/L (ref 3.5–5.3)
Sodium: 136 mEq/L (ref 135–145)
Total Protein: 6.4 g/dL (ref 6.0–8.3)

## 2013-06-22 LAB — CK: CK TOTAL: 132 U/L (ref 7–232)

## 2013-06-22 LAB — MAGNESIUM: Magnesium: 1.3 mg/dL — ABNORMAL LOW (ref 1.5–2.5)

## 2013-06-22 MED ORDER — INSULIN ASPART 100 UNIT/ML FLEXPEN: 16.0000 [IU] | PEN_INJECTOR | Freq: Three times a day (TID) | SUBCUTANEOUS | Status: DC

## 2013-06-22 MED ORDER — GLUCOSE BLOOD VI STRP: ORAL_STRIP | Status: DC

## 2013-06-22 MED ORDER — INSULIN DETEMIR 100 UNIT/ML FLEXPEN: PEN_INJECTOR | SUBCUTANEOUS | Status: DC

## 2013-06-22 NOTE — Addendum Note (Signed)
Addended by: Shary Decamp B on: 06/22/2013 04:47 PM   Modules accepted: Orders

## 2013-06-22 NOTE — Progress Notes (Signed)
Subjective:    Patient ID: Curtis Hunt, male    DOB: 09/18/1950, 63 y.o.   MRN: [id number]  HPI Patient was admitted to the hospital in DKA.  I have copied the remnant portion of this discharge summary and pasted into the office visit below: Admit date: 06/08/2013  Discharge date: 06/15/2013  Time spent: 45 minutes  Recommendations for Outpatient Follow-up:  1. Followup with primary care physician optimally within one week after discharge 2. Followup with [facility]; they will call with appointment date and time 3. CT angiogram abdomen and pelvis this admission revealed incidental finding of a subcentimeter right lower lobe pleural nodule which will need additional followup in the next 6-12 months 4. Same CT scan revealed increased density within the jejunum mesenteric fat with small nodes within which were suspicious for mesenteric adenitis/panniculitis. Patient without abdominal pain or diarrhea Discharge Diagnoses:  Active Problems:  Newly diagnosed diabetes/new start insulin  DKA (diabetic ketoacidoses)-resolved  Acute renal failure-resolved  Rhabdomyolysis-resolving  Hypernatremia-resolved  Weight loss due to extreme water deficit-resolved  Jerking movements of extremities/hypophosphatemia-resolved  Dehydration-resolved  Metabolic encephalopathy-resolved  Oral thrush-resolved  GERD (gastroesophageal reflux disease)  Dyslipidemia  ? Jejunal adenitis/panniculitis-asymptomatic  Subcentimeter right lower lobe pleural nodule  Discharge Condition: stable  Diet recommendation: Carbohydrate modified  Filed Weights    06/12/13 2013  06/13/13 2136  06/14/13 2147   Weight:  238 lb (107.956 kg)  238 lb (107.956 kg)  238 lb (107.956 kg)   History of present illness:  63 year old male patient with only significant past medical history of dyslipidemia and prostate cancer. Was sent to the ER by his primary care physician after laboratory data revealed extremely  elevated serum glucose readings.  In the emergency department his serum glucose was greater than 1400, his CPK was around 2700. His sodium was normal, he was hyperkalemic at 6.0, he also had acute renal failure with a BUN of 87 and a creatinine of 3.04.  Hospital Course:  DKA in Newly diagnosed diabetes  -HgbA1c = 15.6  - Started on Levemir twice a day this admission with controlled glucoses noted at time of discharge-continue after discharge  -Started on meal coverage this admission and will continue after  - consider add Metformin to improve insulin sensitization and possibly decrease insulin doses-likely as OP-previous rhabdomyolysis needs to be resolved (see below)  - consulted diabetes educator-rec OP Diabetes FU at clinic/ordered  - Demonstrated competence regarding self administered insulin injections prior to discharge  -Educational materials provided at discharge  Acute renal failure  -Resolved after hydration  -renal US revealed "dromedary hump" vs mass left kidney-CT angiogram of abdomen and pelvis revealed no renal abnormality  Rhabdomyolysis  -CPK 24 hours prior to presentation was 501 and on date of admission was 2716. Peaked at [place]. On date of discharge was down to 362.  -will need to be completely resolved before can begin Metformin  Weight loss/Dehydration/hypernatremia  -Initial water deficit calculated at 24 L  -Now euvolemic discharge weight 238 lbs  Jerking movements of extremities/hypophosphatemia  -suspected due to electrolyte disturbance  - repleted phosphorus initially since was low-phos 2.7 after replete  - Mg was 3.0  Metabolic encephalopathy  - resolved  -primarily due to dehydration, hypernatremia and hyperglycemia  -EEG unremarkable re: seizure  -MRI neg  Oral thrush  -2/2 prolonged hyperglycemia  -Nystatin S/S completed   Since leaving the hospital the patient is doing surprisingly well. His blood sugars are ranging 80 to 1:30 in  the morning and  100-202 hours after meals. He continues to use Levemir 65 units in the morning and 60 units in the afternoon. He continues to use NovoLog 16 units with every meal. He is not having any hypo-glycemic spells. He denies any side effects of the medication. His blurred vision is improving. The muscle tremors and muscle twitches have stopped.  He no longer is having double vision or dysphagia. These all seem to be via problems caused by his hypophosphatemia and metabolic dyscrasia is caused by the DKA.  Overall he is back to normal from a physical standpoint. Past Medical History  Diagnosis Date  . Hyperlipidemia   . Gout   . Cancer 2004    Prostate CA with surgery   Current Outpatient Prescriptions on File Prior to Visit  Medication Sig Dispense Refill  . aspirin 81 MG tablet Take 81 mg by mouth daily.      Marland Kitchen glucose blood test strip Use as instructed  100 each  12  . glucose monitoring kit (FREESTYLE) monitoring kit 1 each by Does not apply route as needed for other.  1 each  0  . insulin aspart (NOVOLOG) 100 UNIT/ML injection Inject 16 Units into the skin 3 (three) times daily with meals.  10 mL  11  . Insulin Pen Needle 31G X 6 MM MISC Use with Levemir Flextouch Pen  100 each  0  . omeprazole (PRILOSEC) 20 MG capsule Take 20 mg by mouth daily.      . rosuvastatin (CRESTOR) 40 MG tablet Take 40 mg by mouth daily.      Marland Kitchen ULORIC 80 MG TABS TAKE 1 TABLET (80 MG TOTAL) BY MOUTH DAILY.  30 tablet  5   No current facility-administered medications on file prior to visit.   No Known Allergies No past surgical history on file. Current Outpatient Prescriptions on File Prior to Visit  Medication Sig Dispense Refill  . aspirin 81 MG tablet Take 81 mg by mouth daily.      Marland Kitchen glucose blood test strip Use as instructed  100 each  12  . glucose monitoring kit (FREESTYLE) monitoring kit 1 each by Does not apply route as needed for other.  1 each  0  . insulin aspart (NOVOLOG) 100 UNIT/ML injection Inject 16  Units into the skin 3 (three) times daily with meals.  10 mL  11  . Insulin Pen Needle 31G X 6 MM MISC Use with Levemir Flextouch Pen  100 each  0  . omeprazole (PRILOSEC) 20 MG capsule Take 20 mg by mouth daily.      . rosuvastatin (CRESTOR) 40 MG tablet Take 40 mg by mouth daily.      Marland Kitchen ULORIC 80 MG TABS TAKE 1 TABLET (80 MG TOTAL) BY MOUTH DAILY.  30 tablet  5   No current facility-administered medications on file prior to visit.   No Known Allergies History   Social History  . Marital Status: Married    Spouse Name: N/A    Number of Children: N/A  . Years of Education: N/A   Occupational History  . Not on file.   Social History Main Topics  . Smoking status: Never Smoker   . Smokeless tobacco: Not on file  . Alcohol Use: No  . Drug Use: No  . Sexual Activity: Not on file   Other Topics Concern  . Not on file   Social History Narrative  . No narrative on file  Review of Systems  All other systems reviewed and are negative.       Objective:   Physical Exam  Vitals reviewed. Constitutional: He is oriented to person, place, and time.  Cardiovascular: Normal rate, regular rhythm and normal heart sounds.   Pulmonary/Chest: Effort normal and breath sounds normal. No respiratory distress. He has no wheezes. He has no rales.  Abdominal: Soft. Bowel sounds are normal. He exhibits no distension. There is no tenderness. There is no rebound.  Musculoskeletal: He exhibits no edema.  Neurological: He is alert and oriented to person, place, and time. He has normal reflexes. He displays normal reflexes. No cranial nerve deficit. He exhibits normal muscle tone. Coordination normal.          Assessment & Plan:  1. Type II or unspecified type diabetes mellitus without mention of complication, uncontrolled I will repeat a CMP, magnesium level, and creatine kinase level. If his labs that are normal, I have asked the patient continued Levemir and NovoLog the next weeks to  allow his body to recover. I see the patient back in one month. If he continues to do well, my plan is to discontinue NovoLog and start the patient on metformin. Then gradually add oral agents and decrease his insulin use as long as the patient will tolerate it and his blood sugars will remain well controlled.  Recheck in 3 weeks or immediately if worse. - COMPLETE METABOLIC PANEL WITH GFR - Magnesium - CK

## 2013-06-23 ENCOUNTER — Other Ambulatory Visit: Payer: Self-pay | Admitting: Family Medicine

## 2013-06-23 ENCOUNTER — Telehealth: Payer: Self-pay | Admitting: Family Medicine

## 2013-06-23 MED ORDER — GLUCOSE BLOOD VI STRP: ORAL_STRIP | Status: DC

## 2013-06-23 MED ORDER — MAGNESIUM OXIDE 400 MG PO TABS: 400.0000 mg | ORAL_TABLET | Freq: Every day | ORAL | Status: DC

## 2013-06-23 NOTE — Telephone Encounter (Signed)
Strips resent to pharm

## 2013-06-23 NOTE — Telephone Encounter (Signed)
Curtis Hunt needs new Rxs called in for lancets and strips for the One Touch Ultra. He needs enough to cover he checks his blood sugar 5 to 6 times a day

## 2013-07-03 ENCOUNTER — Ambulatory Visit: Payer: 59 | Admitting: Podiatry

## 2013-07-07 ENCOUNTER — Other Ambulatory Visit: Payer: Self-pay | Admitting: Family Medicine

## 2013-07-07 NOTE — Telephone Encounter (Signed)
Refill appropriate and filled per protocol. 

## 2013-07-17 ENCOUNTER — Ambulatory Visit (INDEPENDENT_AMBULATORY_CARE_PROVIDER_SITE_OTHER): Payer: 59 | Admitting: Podiatry

## 2013-07-17 ENCOUNTER — Encounter: Payer: Self-pay | Admitting: Podiatry

## 2013-07-17 DIAGNOSIS — M79609 Pain in unspecified limb: Secondary | ICD-10-CM

## 2013-07-17 DIAGNOSIS — B351 Tinea unguium: Secondary | ICD-10-CM

## 2013-07-17 NOTE — Patient Instructions (Signed)
Diabetes and Foot Care Diabetes may cause you to have problems because of poor blood supply (circulation) to your feet and legs. This may cause the skin on your feet to become thinner, break easier, and heal more slowly. Your skin may become dry, and the skin may peel and crack. You may also have nerve damage in your legs and feet causing decreased feeling in them. You may not notice minor injuries to your feet that could lead to infections or more serious problems. Taking care of your feet is one of the most important things you can do for yourself.  HOME CARE INSTRUCTIONS  Wear shoes at all times, even in the house. Do not go barefoot. Bare feet are easily injured.  Check your feet daily for blisters, cuts, and redness. If you cannot see the bottom of your feet, use a mirror or ask someone for help.  Wash your feet with warm water (do not use hot water) and mild soap. Then pat your feet and the areas between your toes until they are completely dry. Do not soak your feet as this can dry your skin.  Apply a moisturizing lotion or petroleum jelly (that does not contain alcohol and is unscented) to the skin on your feet and to dry, brittle toenails. Do not apply lotion between your toes.  Trim your toenails straight across. Do not dig under them or around the cuticle. File the edges of your nails with an emery board or nail file.  Do not cut corns or calluses or try to remove them with medicine.  Wear clean socks or stockings every day. Make sure they are not too tight. Do not wear knee-high stockings since they may decrease blood flow to your legs.  Wear shoes that fit properly and have enough cushioning. To break in new shoes, wear them for just a few hours a day. This prevents you from injuring your feet. Always look in your shoes before you put them on to be sure there are no objects inside.  Do not cross your legs. This may decrease the blood flow to your feet.  If you find a minor scrape,  cut, or break in the skin on your feet, keep it and the skin around it clean and dry. These areas may be cleansed with mild soap and water. Do not cleanse the area with peroxide, alcohol, or iodine.  When you remove an adhesive bandage, be sure not to damage the skin around it.  If you have a wound, look at it several times a day to make sure it is healing.  Do not use heating pads or hot water bottles. They may burn your skin. If you have lost feeling in your feet or legs, you may not know it is happening until it is too late.  Make sure your health care provider performs a complete foot exam at least annually or more often if you have foot problems. Report any cuts, sores, or bruises to your health care provider immediately. SEEK MEDICAL CARE IF:   You have an injury that is not healing.  You have cuts or breaks in the skin.  You have an ingrown nail.  You notice redness on your legs or feet.  You feel burning or tingling in your legs or feet.  You have pain or cramps in your legs and feet.  Your legs or feet are numb.  Your feet always feel cold. SEEK IMMEDIATE MEDICAL CARE IF:   There is increasing redness,   swelling, or pain in or around a wound.  There is a red line that goes up your leg.  Pus is coming from a wound.  You develop a fever or as directed by your health care provider.  You notice a bad smell coming from an ulcer or wound. Document Released: 05/08/2000 Document Revised: 01/11/2013 Document Reviewed: 10/18/2012 [employer] Patient Information 2014 [employer].  

## 2013-07-17 NOTE — Progress Notes (Signed)
   Subjective:    Patient ID: Curtis Hunt, male    DOB: Oct 14, 1950, 63 y.o.   MRN: [id number]  HPI PT STATED JUST FOUND OUT  AND DIAGNOSED WITH DIABETIC TYPE 2 WOULD LIKE TO CHECK BOTH FEET, BUT NOTHING IS HURTING EXCEPT TRIED TO TRIM THE TOENAILS BUT ITS GETTING A LITTLE HARD.    Review of Systems  All other systems reviewed and are negative.       Objective:   Physical Exam Orientated x3 male  Vascular: DP are 2/4 bilaterally. PTs are 1/4 bilaterally  Neurological: Sensation detained in monofilament wire intact 7/10 right and 9/10 left. Vibratory sensation diminished bilaterally. Knee and ankle flex and reactive bilaterally  Dermatological: Dry skin noted bilaterally. The nail plates are incurvated, discolored, hypertrophied and are tender to palpation in all nail plates.  Musculoskeletal bilateral Taylor's bunion is noted.       Assessment & Plan:  Assessment: Some suggestion of possible peripheral arterial disease because of diminished posterior tibial pulses Diabetic sensory neuropathy Symptomatic onychomycoses x10  Plan: Nails x10 are debrided without a bleeding. I had a gentle discussion about diabetic footcare today and provided patient with diabetic foot information  Reappoint x3 months

## 2013-08-01 ENCOUNTER — Encounter: Payer: Self-pay | Admitting: *Deleted

## 2013-08-01 ENCOUNTER — Encounter: Payer: 59 | Attending: Internal Medicine | Admitting: *Deleted

## 2013-08-01 VITALS — Ht 73.0 in | Wt 266.0 lb

## 2013-08-01 DIAGNOSIS — E119 Type 2 diabetes mellitus without complications: Secondary | ICD-10-CM | POA: Insufficient documentation

## 2013-08-01 DIAGNOSIS — E1110 Type 2 diabetes mellitus with ketoacidosis without coma: Secondary | ICD-10-CM

## 2013-08-01 DIAGNOSIS — Z713 Dietary counseling and surveillance: Secondary | ICD-10-CM | POA: Insufficient documentation

## 2013-08-01 NOTE — Progress Notes (Signed)
Appt start time: 0930 end time:  1100.  Assessment:  Patient was seen on  08/01/13 for individual diabetes education. He was diagnosed about 2 months ago when hospitalized with DKA and an A1c of over 15%. He states he lives with wife, they share the food shopping and meal preparation. He is taking Levemir and Novolog insulin as directed, but states he has to eat to keep his BG up. He is walking more lately and uses his stationary bike at home almost every day for about 20 minutes at a time. He enjoys time with his grandkids, and doing things with his friends. He is retired from [employer].   Current HbA1c: 15.6%  Preferred Learning Style:   No preference indicated   Learning Readiness:   Ready  Change in progress  MEDICATIONS: see list, diabetes medications are Levemir and Novolog  DIETARY INTAKE:  24-hr recall:  B ( AM): [place] bacon, 3 pancakes, with sugar free syrup and margarine,  fresh fruit, diet soda Snk ( AM): not usually unless a sandwich with lean meat and mustard L ( PM): sandwich again with vegetables on it, diet soda Snk ( PM): no D ( PM): lean meat, steamed vegetables, baked sweet potato fries and salad, diet soda or water  Snk ( PM): to prevent low BG, another sandwich Beverages: diet soda, water  Usual physical activity: walks and rides stationary bike at home  Estimated energy needs: 2000 calories 225 g carbohydrates 150 g protein 56 g fat  Intervention:  Nutrition counseling provided.  Discussed diabetes disease process and treatment options.  Discussed physiology of diabetes and role of obesity on insulin resistance.  Encouraged moderate weight reduction to improve glucose levels.  Discussed role of medications and diet in glucose control  Provided education on macronutrients on glucose levels.  Provided education on carb counting, importance of regularly scheduled meals/snacks, and meal planning  Discussed effects of physical activity on  glucose levels and long-term glucose control.  Recommended he continue his current physical activity/week.  Reviewed patient medications.  Discussed role of medication on blood glucose and possible side effects. Today we discussed Metfomin which the MD plans to start later this week.   Discussed blood glucose monitoring and interpretation.  Discussed recommended target ranges and individual ranges.    Described short-term complications: hyper- and hypo-glycemia.  Discussed causes,symptoms, and treatment options.  Discussed prevention, detection, and treatment of long-term complications.  Discussed the role of prolonged elevated glucose levels on body systems.  Discussed role of stress on blood glucose levels and discussed strategies to manage psychosocial issues.  Provided recommendations for long-term diabetes self-care.  Established checklist for medical, dental, and emotional self-care.  Plan:  Aim for 4 Carb Choices per meal (60 grams) +/- 1 either way  Aim for 0-2 Carbs per snack if hungry  Consider reading food labels for Total Carbohydrate of foods Continue with your activity level daily as tolerated Continue checking BG at alternate times per day as directed by MD  Teaching Method Utilized: Visual, Auditory and Hands on  Handouts given during visit include: Living Well with Diabetes Carb Counting and Food Label handouts Meal Plan Card  Barriers to learning/adherence to lifestyle change: none  Diabetes self-care support plan:   [facility] support group  States his wife is supportive and knowledgeable  Demonstrated degree of understanding via:  Teach Back   Monitoring/Evaluation:  Dietary intake, exercise, reading food labels, SMBG, and body weight in 1 month(s).

## 2013-08-01 NOTE — Patient Instructions (Signed)
Plan:  Aim for 4 Carb Choices per meal (60 grams) +/- 1 either way  Aim for 0-2 Carbs per snack if hungry  Consider reading food labels for Total Carbohydrate of foods Continue with your activity level daily as tolerated Continue checking BG at alternate times per day as directed by MD

## 2013-08-02 ENCOUNTER — Ambulatory Visit (INDEPENDENT_AMBULATORY_CARE_PROVIDER_SITE_OTHER): Payer: 59 | Admitting: Family Medicine

## 2013-08-02 DIAGNOSIS — Z111 Encounter for screening for respiratory tuberculosis: Secondary | ICD-10-CM

## 2013-08-04 ENCOUNTER — Ambulatory Visit (INDEPENDENT_AMBULATORY_CARE_PROVIDER_SITE_OTHER): Payer: 59 | Admitting: Family Medicine

## 2013-08-04 ENCOUNTER — Encounter: Payer: Self-pay | Admitting: Family Medicine

## 2013-08-04 VITALS — BP 120/78 | HR 74 | Temp 97.6°F | Resp 16 | Ht 73.0 in | Wt 267.0 lb

## 2013-08-04 DIAGNOSIS — E1165 Type 2 diabetes mellitus with hyperglycemia: Principal | ICD-10-CM

## 2013-08-04 DIAGNOSIS — IMO0001 Reserved for inherently not codable concepts without codable children: Secondary | ICD-10-CM

## 2013-08-04 LAB — COMPLETE METABOLIC PANEL WITH GFR
ALT: 10 U/L (ref 0–53)
AST: 14 U/L (ref 0–37)
Albumin: 4 g/dL (ref 3.5–5.2)
Alkaline Phosphatase: 61 U/L (ref 39–117)
BUN: 22 mg/dL (ref 6–23)
CALCIUM: 9.4 mg/dL (ref 8.4–10.5)
CO2: 26 mEq/L (ref 19–32)
CREATININE: 1.62 mg/dL — AB (ref 0.50–1.35)
Chloride: 103 mEq/L (ref 96–112)
GFR, Est African American: 51 mL/min — ABNORMAL LOW
GFR, Est Non African American: 45 mL/min — ABNORMAL LOW
Glucose, Bld: 81 mg/dL (ref 70–99)
Potassium: 4.5 mEq/L (ref 3.5–5.3)
Sodium: 137 mEq/L (ref 135–145)
Total Bilirubin: 0.2 mg/dL (ref 0.2–1.2)
Total Protein: 7.5 g/dL (ref 6.0–8.3)

## 2013-08-04 LAB — TB SKIN TEST
Induration: 0 mm
TB SKIN TEST: NEGATIVE

## 2013-08-04 LAB — MAGNESIUM: MAGNESIUM: 1.9 mg/dL (ref 1.5–2.5)

## 2013-08-04 MED ORDER — VALSARTAN 80 MG PO TABS: 80.0000 mg | ORAL_TABLET | Freq: Every day | ORAL | Status: DC

## 2013-08-04 MED ORDER — ESOMEPRAZOLE MAGNESIUM 40 MG PO CPDR: 40.0000 mg | DELAYED_RELEASE_CAPSULE | Freq: Every day | ORAL | Status: DC

## 2013-08-04 NOTE — Progress Notes (Signed)
Subjective:    Patient ID: Curtis Hunt, male    DOB: 08-21-1950, 63 y.o.   MRN: [id number]  HPI 06/22/13 Patient was admitted to the hospital in DKA.  I have copied the remnant portion of this discharge summary and pasted into the office visit below: Admit date: 06/08/2013  Discharge date: 06/15/2013  Time spent: 45 minutes  Recommendations for Outpatient Follow-up:  1. Followup with primary care physician optimally within one week after discharge 2. Followup with  diabetes outpatient center; they will call with appointment date and time 3. CT angiogram abdomen and pelvis this admission revealed incidental finding of a subcentimeter right lower lobe pleural nodule which will need additional followup in the next 6-12 months 4. Same CT scan revealed increased density within the jejunum mesenteric fat with small nodes within which were suspicious for mesenteric adenitis/panniculitis. Patient without abdominal pain or diarrhea Discharge Diagnoses:  Active Problems:  Newly diagnosed diabetes/new start insulin  DKA (diabetic ketoacidoses)-resolved  Acute renal failure-resolved  Rhabdomyolysis-resolving  Hypernatremia-resolved  Weight loss due to extreme water deficit-resolved  Jerking movements of extremities/hypophosphatemia-resolved  Dehydration-resolved  Metabolic encephalopathy-resolved  Oral thrush-resolved  GERD (gastroesophageal reflux disease)  Dyslipidemia  ? Jejunal adenitis/panniculitis-asymptomatic  Subcentimeter right lower lobe pleural nodule  Discharge Condition: stable  Diet recommendation: Carbohydrate modified  Filed Weights    06/12/13 2013  06/13/13 2136  06/14/13 2147   Weight:  238 lb (107.956 kg)  238 lb (107.956 kg)  238 lb (107.956 kg)   History of present illness:  63 year old male patient with only significant past medical history of dyslipidemia and prostate cancer. Was sent to the ER by his primary care physician after laboratory data revealed  extremely elevated serum glucose readings.  In the emergency department his serum glucose was greater than 1400, his CPK was around 2700. His sodium was normal, he was hyperkalemic at 6.0, he also had acute renal failure with a BUN of 87 and a creatinine of 3.04.  Hospital Course:  DKA in Newly diagnosed diabetes  -HgbA1c = 15.6  - Started on Levemir twice a day this admission with controlled glucoses noted at time of discharge-continue after discharge  -Started on meal coverage this admission and will continue after  - consider add Metformin to improve insulin sensitization and possibly decrease insulin doses-likely as OP-previous rhabdomyolysis needs to be resolved (see below)  - consulted diabetes educator-rec OP Diabetes FU at clinic/ordered  - Demonstrated competence regarding self administered insulin injections prior to discharge  -Educational materials provided at discharge  Acute renal failure  -Resolved after hydration  -renal US revealed "dromedary hump" vs mass left kidney-CT angiogram of abdomen and pelvis revealed no renal abnormality  Rhabdomyolysis  -CPK 24 hours prior to presentation was 501 and on date of admission was 2716. Peaked at [place]. On date of discharge was down to 362.  -will need to be completely resolved before can begin Metformin  Weight loss/Dehydration/hypernatremia  -Initial water deficit calculated at 24 L  -Now euvolemic discharge weight 238 lbs  Jerking movements of extremities/hypophosphatemia  -suspected due to electrolyte disturbance  - repleted phosphorus initially since was low-phos 2.7 after replete  - Mg was 3.0  Metabolic encephalopathy  - resolved  -primarily due to dehydration, hypernatremia and hyperglycemia  -EEG unremarkable re: seizure  -MRI neg  Oral thrush  -2/2 prolonged hyperglycemia  -Nystatin S/S completed   Since leaving the hospital the patient is doing surprisingly well. His blood sugars are ranging 80 to 1:30  in the morning  and 100-202 hours after meals. He continues to use Levemir 65 units in the morning and 60 units in the afternoon. He continues to use NovoLog 16 units with every meal. He is not having any hypo-glycemic spells. He denies any side effects of the medication. His blurred vision is improving. The muscle tremors and muscle twitches have stopped.  He no longer is having double vision or dysphagia. These all seem to be via problems caused by his hypophosphatemia and metabolic dyscrasia is caused by the DKA.  Overall he is back to normal from a physical standpoint. At that time, my plan was: 1. Type II or unspecified type diabetes mellitus without mention of complication, uncontrolled I will repeat a CMP, magnesium level, and creatine kinase level. If his labs that are normal, I have asked the patient continued Levemir and NovoLog the next weeks to allow his body to recover. I see the patient back in one month. If he continues to do well, my plan is to discontinue NovoLog and start the patient on metformin. Then gradually add oral agents and decrease his insulin use as long as the patient will tolerate it and his blood sugars will remain well controlled.  Recheck in 3 weeks or immediately if worse. - COMPLETE METABOLIC PANEL WITH GFR - Magnesium - CK  08/04/13 The patient continues to do outstanding. His sugars ranged between 101 130. Blood sugar he has seen is in the upper 80s. He has had no hypoglycemic episodes. He denies any elevated blood sugars. His muscle symptoms have resolved. His last lab data revealed magnesium the magnesium level of 1.3. He is currently taking magnesium oxide 400 mg by mouth daily. We are due to recheck his magnesium level today. He saw his eye doctor last month. He sees him regularly. He saw his podiatrist. Is no nutritionist. He is taking a baby aspirin 81 mg every day. He denies any neuropathy or sores in the feet. Overall he is doing amazing. Past Medical History  Diagnosis Date    . Hyperlipidemia   . Gout   . Cancer 2004    Prostate CA with surgery  . Diabetes mellitus without complication    Current Outpatient Prescriptions on File Prior to Visit  Medication Sig Dispense Refill  . aspirin 81 MG tablet Take 81 mg by mouth daily.      . B-D UF III MINI PEN NEEDLES 31G X 5 MM MISC USE WITH LEVEMIR FLEXTOUCH.  150 each  11  . glucose blood test strip Check BS 4-5 x day - qam, after meals and at bedtime - Dx - 250.00  200 each  12  . glucose monitoring kit (FREESTYLE) monitoring kit 1 each by Does not apply route as needed for other.  1 each  0  . insulin aspart (NOVOLOG) 100 UNIT/ML FlexPen Inject 16 Units into the skin 3 (three) times daily with meals.  15 mL  11  . insulin aspart (NOVOLOG) 100 UNIT/ML injection Inject 16 Units into the skin 3 (three) times daily with meals.  10 mL  11  . Insulin Detemir (LEVEMIR) 100 UNIT/ML Pen 65 units qam & 60 qpm  15 pen  5  . Insulin Pen Needle 31G X 6 MM MISC Use with Levemir Flextouch Pen  100 each  0  . magnesium oxide (MAG-OX) 400 MG tablet Take 1 tablet (400 mg total) by mouth daily.  30 tablet  2  . nystatin (MYCOSTATIN) 100000 UNIT/ML suspension       .  omeprazole (PRILOSEC) 20 MG capsule Take 20 mg by mouth daily.      . rosuvastatin (CRESTOR) 40 MG tablet Take 40 mg by mouth daily.      Marland Kitchen ULORIC 80 MG TABS TAKE 1 TABLET (80 MG TOTAL) BY MOUTH DAILY.  30 tablet  5   No current facility-administered medications on file prior to visit.   No Known Allergies Past Surgical History  Procedure Laterality Date  . Prostate surgery     Current Outpatient Prescriptions on File Prior to Visit  Medication Sig Dispense Refill  . aspirin 81 MG tablet Take 81 mg by mouth daily.      . B-D UF III MINI PEN NEEDLES 31G X 5 MM MISC USE WITH LEVEMIR FLEXTOUCH.  150 each  11  . glucose blood test strip Check BS 4-5 x day - qam, after meals and at bedtime - Dx - 250.00  200 each  12  . glucose monitoring kit (FREESTYLE) monitoring  kit 1 each by Does not apply route as needed for other.  1 each  0  . insulin aspart (NOVOLOG) 100 UNIT/ML FlexPen Inject 16 Units into the skin 3 (three) times daily with meals.  15 mL  11  . insulin aspart (NOVOLOG) 100 UNIT/ML injection Inject 16 Units into the skin 3 (three) times daily with meals.  10 mL  11  . Insulin Detemir (LEVEMIR) 100 UNIT/ML Pen 65 units qam & 60 qpm  15 pen  5  . Insulin Pen Needle 31G X 6 MM MISC Use with Levemir Flextouch Pen  100 each  0  . magnesium oxide (MAG-OX) 400 MG tablet Take 1 tablet (400 mg total) by mouth daily.  30 tablet  2  . nystatin (MYCOSTATIN) 100000 UNIT/ML suspension       . omeprazole (PRILOSEC) 20 MG capsule Take 20 mg by mouth daily.      . rosuvastatin (CRESTOR) 40 MG tablet Take 40 mg by mouth daily.      Marland Kitchen ULORIC 80 MG TABS TAKE 1 TABLET (80 MG TOTAL) BY MOUTH DAILY.  30 tablet  5   No current facility-administered medications on file prior to visit.   No Known Allergies History   Social History  . Marital Status: Married    Spouse Name: N/A    Number of Children: N/A  . Years of Education: N/A   Occupational History  . Not on file.   Social History Main Topics  . Smoking status: Never Smoker   . Smokeless tobacco: Never Used  . Alcohol Use: Yes     Comment: Beer prior to diagnosis of diabetes, about once a week during sports on tv  . Drug Use: No  . Sexual Activity: Not on file   Other Topics Concern  . Not on file   Social History Narrative  . No narrative on file      Review of Systems  All other systems reviewed and are negative.       Objective:   Physical Exam  Vitals reviewed. Constitutional: He is oriented to person, place, and time.  Cardiovascular: Normal rate, regular rhythm and normal heart sounds.   Pulmonary/Chest: Effort normal and breath sounds normal. No respiratory distress. He has no wheezes. He has no rales.  Abdominal: Soft. Bowel sounds are normal. He exhibits no distension. There is  no tenderness. There is no rebound.  Musculoskeletal: He exhibits no edema.  Neurological: He is alert and oriented to person, place, and time.  He has normal reflexes. No cranial nerve deficit. He exhibits normal muscle tone. Coordination normal.          Assessment & Plan:  1. Type II or unspecified type diabetes mellitus without mention of complication, uncontrolled - COMPLETE METABOLIC PANEL WITH GFR  2. Hypomagnesemia - COMPLETE METABOLIC PANEL WITH GFR  I will check a magnesium level today. If normalized I will discontinue the magnesium oxide. I will him to continue 65 units of Levemir in the morning and 60 units of Levemir in the evening. At present he is only using NovoLog 16 units with lunch but it seems to be working well. I will add Diovan 80 mg by mouth daily for renal protection. I will recheck hemoglobin A1c as well as a urine microalbumin at his office visit at the end of April.

## 2013-08-18 ENCOUNTER — Other Ambulatory Visit: Payer: Self-pay | Admitting: Family Medicine

## 2013-08-18 NOTE — Telephone Encounter (Signed)
Medication refilled per protocol. 

## 2013-09-12 ENCOUNTER — Ambulatory Visit: Payer: 59 | Admitting: *Deleted

## 2013-09-14 ENCOUNTER — Encounter: Payer: 59 | Attending: Internal Medicine | Admitting: *Deleted

## 2013-09-14 DIAGNOSIS — E1110 Type 2 diabetes mellitus with ketoacidosis without coma: Secondary | ICD-10-CM

## 2013-09-14 DIAGNOSIS — Z713 Dietary counseling and surveillance: Secondary | ICD-10-CM | POA: Insufficient documentation

## 2013-09-14 DIAGNOSIS — E119 Type 2 diabetes mellitus without complications: Secondary | ICD-10-CM | POA: Insufficient documentation

## 2013-09-14 NOTE — Progress Notes (Signed)
Appt start time: 0830 end time:  0900.  Assessment:  Patient was seen on  08/01/13 for individual diabetes education follow up visit. He states he is SMBG 5 times a day. FBG are below 100, his highest BG during the day are in the afternoon but those are never over 200 mg/dl. He takes Novolog for lunch meal and supper meal only if BG is above 100 mg/dl, so he omits his Novolog often. He states he feels symptomatic with hypoglycemia around 90 mg/dl and treats with hard candy.  Current HbA1c: 15.6%, plans to retest the end of April  Preferred Learning Style:   No preference indicated   Learning Readiness:   Ready  Change in progress  MEDICATIONS: see list, diabetes medications are Levemir and Novolog  DIETARY INTAKE:  24-hr recall:  B ( AM):  A biscuit, [place] sausage, unsweet applesauce, diet soda OR [place] bacon, now only 2 pancakes, with sugar free syrup and margarine,  fresh fruit, diet soda Snk ( AM): no more moon pies, now a sandwich with lean meat and mustard L ( PM): sandwich with lean meat again with vegetables on it, OR 2 pieces grilled or fried chicken diet soda Snk ( PM): no D ( PM): lean meat, steamed vegetables, baked sweet potato fries and salad, diet soda or water  Snk ( PM): another sandwich Beverages: diet soda, water  Usual physical activity: walks and rides stationary bike at home  Estimated energy needs: 2000 calories 225 g carbohydrates 150 g protein 56 g fat  Intervention:    Commended him on his very well controlled diabetes! Also reviewed his eating habits and they appear to be appropriate as well, with an average of 3 Carb Choices per meal instead of the 4 I had originally suggested.   Reviewed insulin action of Levemir and Novolog. Explained the difference between the basal coverage of Levemir and the meal time coverage of Novolog.  Reviewed the rationale and benefits of increased activity and the impact of exercise can be as long as 24 hours after  activity is completed.   Plan:  Aim for 3 Carb Choices per meal (45 grams) +/- 1 either way  Aim for 0-2 Carbs per snack if hungry  Continue reading food labels for Total Carbohydrate of foods Continue with your activity level daily as tolerated Continue checking BG at alternate times per day as directed by MD  If your BGs are too low, consider reducing your Levemir which works in your body for up to 24 hours.    Teaching Method Utilized: Visual, Auditory and Hands on  Handouts given during visit include: Insulin Action handout Another Meal Plan Card DM 2 Support Group Flyer  Barriers to learning/adherence to lifestyle change: none  Diabetes self-care support plan:   [facility] support group - Flyer given  States his wife is supportive and knowledgeable  Demonstrated degree of understanding via:  Teach Back   Monitoring/Evaluation:  Dietary intake, exercise, reading food labels, SMBG, and body weight in 2 month(s).

## 2013-09-14 NOTE — Patient Instructions (Addendum)
Plan:  Aim for 3 Carb Choices per meal (45 grams) +/- 1 either way  Aim for 0-2 Carbs per snack if hungry  Continue reading food labels for Total Carbohydrate of foods Continue with your activity level daily as tolerated Continue checking BG at alternate times per day as directed by MD  If your BGs are too low, consider reducing your Levemir which works in your body for up to 24 hours.

## 2013-09-22 ENCOUNTER — Telehealth: Payer: Self-pay | Admitting: Family Medicine

## 2013-09-22 DIAGNOSIS — IMO0001 Reserved for inherently not codable concepts without codable children: Secondary | ICD-10-CM

## 2013-09-22 DIAGNOSIS — E1165 Type 2 diabetes mellitus with hyperglycemia: Principal | ICD-10-CM

## 2013-09-22 MED ORDER — INSULIN DETEMIR 100 UNIT/ML FLEXPEN: PEN_INJECTOR | SUBCUTANEOUS | Status: DC

## 2013-09-22 NOTE — Telephone Encounter (Signed)
Medication refilled per protocol. 

## 2013-10-23 ENCOUNTER — Ambulatory Visit (INDEPENDENT_AMBULATORY_CARE_PROVIDER_SITE_OTHER): Payer: 59 | Admitting: Podiatry

## 2013-10-23 ENCOUNTER — Encounter: Payer: Self-pay | Admitting: Podiatry

## 2013-10-23 VITALS — BP 140/91 | HR 70 | Resp 16 | Ht 73.0 in | Wt 260.0 lb

## 2013-10-23 DIAGNOSIS — B351 Tinea unguium: Secondary | ICD-10-CM

## 2013-10-23 DIAGNOSIS — M79609 Pain in unspecified limb: Secondary | ICD-10-CM

## 2013-10-23 NOTE — Progress Notes (Signed)
Patient ID: Curtis Hunt, male   DOB: 02/04/1951, 63 y.o.   MRN: [id number]  Subjective: This patient presents for ongoing debridement of mycotic toenails  Objective: Elongated, hypertrophic, discolored toenails x10  Assessment: Symptomatic onychomycoses x10  Plan: Debrided toenails x10 without a bleeding  Reappoint x3 months

## 2013-11-16 ENCOUNTER — Encounter: Payer: Self-pay | Admitting: Family Medicine

## 2013-11-16 ENCOUNTER — Ambulatory Visit (INDEPENDENT_AMBULATORY_CARE_PROVIDER_SITE_OTHER): Payer: 59 | Admitting: Family Medicine

## 2013-11-16 VITALS — BP 100/68 | HR 78 | Temp 97.7°F | Resp 16 | Ht 73.0 in | Wt 271.0 lb

## 2013-11-16 DIAGNOSIS — E119 Type 2 diabetes mellitus without complications: Secondary | ICD-10-CM

## 2013-11-16 LAB — COMPLETE METABOLIC PANEL WITH GFR
ALK PHOS: 61 U/L (ref 39–117)
ALT: 12 U/L (ref 0–53)
AST: 15 U/L (ref 0–37)
Albumin: 4 g/dL (ref 3.5–5.2)
BUN: 21 mg/dL (ref 6–23)
CO2: 25 mEq/L (ref 19–32)
Calcium: 9.4 mg/dL (ref 8.4–10.5)
Chloride: 106 mEq/L (ref 96–112)
Creat: 1.68 mg/dL — ABNORMAL HIGH (ref 0.50–1.35)
GFR, Est African American: 49 mL/min — ABNORMAL LOW
GFR, Est Non African American: 43 mL/min — ABNORMAL LOW
Glucose, Bld: 133 mg/dL — ABNORMAL HIGH (ref 70–99)
Potassium: 3.9 mEq/L (ref 3.5–5.3)
SODIUM: 143 meq/L (ref 135–145)
TOTAL PROTEIN: 7.4 g/dL (ref 6.0–8.3)
Total Bilirubin: 0.2 mg/dL (ref 0.2–1.2)

## 2013-11-16 LAB — HEMOGLOBIN A1C
Hgb A1c MFr Bld: 6.4 % — ABNORMAL HIGH (ref <5.7)
Mean Plasma Glucose: 137 mg/dL — ABNORMAL HIGH (ref <117)

## 2013-11-16 LAB — MAGNESIUM: MAGNESIUM: 1.7 mg/dL (ref 1.5–2.5)

## 2013-11-16 LAB — MICROALBUMIN, URINE: Microalb, Ur: 4.42 mg/dL — ABNORMAL HIGH (ref 0.00–1.89)

## 2013-11-16 NOTE — Progress Notes (Signed)
Subjective:    Patient ID: Curtis Hunt, male    DOB: 08-21-1950, 63 y.o.   MRN: [id number]  HPI 06/22/13 Patient was admitted to the hospital in DKA.  I have copied the remnant portion of this discharge summary and pasted into the office visit below: Admit date: 06/08/2013  Discharge date: 06/15/2013  Time spent: 45 minutes  Recommendations for Outpatient Follow-up:  1. Followup with primary care physician optimally within one week after discharge 2. Followup with  diabetes outpatient center; they will call with appointment date and time 3. CT angiogram abdomen and pelvis this admission revealed incidental finding of a subcentimeter right lower lobe pleural nodule which will need additional followup in the next 6-12 months 4. Same CT scan revealed increased density within the jejunum mesenteric fat with small nodes within which were suspicious for mesenteric adenitis/panniculitis. Patient without abdominal pain or diarrhea Discharge Diagnoses:  Active Problems:  Newly diagnosed diabetes/new start insulin  DKA (diabetic ketoacidoses)-resolved  Acute renal failure-resolved  Rhabdomyolysis-resolving  Hypernatremia-resolved  Weight loss due to extreme water deficit-resolved  Jerking movements of extremities/hypophosphatemia-resolved  Dehydration-resolved  Metabolic encephalopathy-resolved  Oral thrush-resolved  GERD (gastroesophageal reflux disease)  Dyslipidemia  ? Jejunal adenitis/panniculitis-asymptomatic  Subcentimeter right lower lobe pleural nodule  Discharge Condition: stable  Diet recommendation: Carbohydrate modified  Filed Weights    06/12/13 2013  06/13/13 2136  06/14/13 2147   Weight:  238 lb (107.956 kg)  238 lb (107.956 kg)  238 lb (107.956 kg)   History of present illness:  63 year old male patient with only significant past medical history of dyslipidemia and prostate cancer. Was sent to the ER by his primary care physician after laboratory data revealed  extremely elevated serum glucose readings.  In the emergency department his serum glucose was greater than 1400, his CPK was around 2700. His sodium was normal, he was hyperkalemic at 6.0, he also had acute renal failure with a BUN of 87 and a creatinine of 3.04.  Hospital Course:  DKA in Newly diagnosed diabetes  -HgbA1c = 15.6  - Started on Levemir twice a day this admission with controlled glucoses noted at time of discharge-continue after discharge  -Started on meal coverage this admission and will continue after  - consider add Metformin to improve insulin sensitization and possibly decrease insulin doses-likely as OP-previous rhabdomyolysis needs to be resolved (see below)  - consulted diabetes educator-rec OP Diabetes FU at clinic/ordered  - Demonstrated competence regarding self administered insulin injections prior to discharge  -Educational materials provided at discharge  Acute renal failure  -Resolved after hydration  -renal US revealed "dromedary hump" vs mass left kidney-CT angiogram of abdomen and pelvis revealed no renal abnormality  Rhabdomyolysis  -CPK 24 hours prior to presentation was 501 and on date of admission was 2716. Peaked at [place]. On date of discharge was down to 362.  -will need to be completely resolved before can begin Metformin  Weight loss/Dehydration/hypernatremia  -Initial water deficit calculated at 24 L  -Now euvolemic discharge weight 238 lbs  Jerking movements of extremities/hypophosphatemia  -suspected due to electrolyte disturbance  - repleted phosphorus initially since was low-phos 2.7 after replete  - Mg was 3.0  Metabolic encephalopathy  - resolved  -primarily due to dehydration, hypernatremia and hyperglycemia  -EEG unremarkable re: seizure  -MRI neg  Oral thrush  -2/2 prolonged hyperglycemia  -Nystatin S/S completed   Since leaving the hospital the patient is doing surprisingly well. His blood sugars are ranging 80 to 1:30  in the morning  and 100-202 hours after meals. He continues to use Levemir 65 units in the morning and 60 units in the afternoon. He continues to use NovoLog 16 units with every meal. He is not having any hypo-glycemic spells. He denies any side effects of the medication. His blurred vision is improving. The muscle tremors and muscle twitches have stopped.  He no longer is having double vision or dysphagia. These all seem to be via problems caused by his hypophosphatemia and metabolic dyscrasia is caused by the DKA.  Overall he is back to normal from a physical standpoint. At that time, my plan was: 1. Type II or unspecified type diabetes mellitus without mention of complication, uncontrolled I will repeat a CMP, magnesium level, and creatine kinase level. If his labs that are normal, I have asked the patient continued Levemir and NovoLog the next weeks to allow his body to recover. I see the patient back in one month. If he continues to do well, my plan is to discontinue NovoLog and start the patient on metformin. Then gradually add oral agents and decrease his insulin use as long as the patient will tolerate it and his blood sugars will remain well controlled.  Recheck in 3 weeks or immediately if worse. - COMPLETE METABOLIC PANEL WITH GFR - Magnesium - CK  08/04/13 The patient continues to do outstanding. His sugars ranged between 101 130. Blood sugar he has seen is in the upper 80s. He has had no hypoglycemic episodes. He denies any elevated blood sugars. His muscle symptoms have resolved. His last lab data revealed magnesium the magnesium level of 1.3. He is currently taking magnesium oxide 400 mg by mouth daily. We are due to recheck his magnesium level today. He saw his eye doctor last month. He sees him regularly. He saw his podiatrist. Is no nutritionist. He is taking a baby aspirin 81 mg every day. He denies any neuropathy or sores in the feet. Overall he is doing amazing.  At this time, my plan was: 1. Type II  or unspecified type diabetes mellitus without mention of complication, uncontrolled - COMPLETE METABOLIC PANEL WITH GFR  2. Hypomagnesemia - COMPLETE METABOLIC PANEL WITH GFR  I will check a magnesium level today. If normalized I will discontinue the magnesium oxide. I will him to continue 65 units of Levemir in the morning and 60 units of Levemir in the evening. At present he is only using NovoLog 16 units with lunch but it seems to be working well. I will add Diovan 80 mg by mouth daily for renal protection. I will recheck hemoglobin A1c as well as a urine microalbumin at his office visit at the end of April.  11/16/13 Patient is here today for recheck. He is continuing to take Levemir 65 units in the morning and 60 units in the evening. He continues to inject NovoLog 16 units before meals.  His fasting blood sugars are 85-110. His postprandial sugars are less than 130. He denies any hypoglycemia. Overall he is doing well. He denies any chest pain shortness of breath or dyspnea on exertion. Past Medical History  Diagnosis Date  . Hyperlipidemia   . Gout   . Cancer 2004    Prostate CA with surgery  . Diabetes mellitus without complication    Current Outpatient Prescriptions on File Prior to Visit  Medication Sig Dispense Refill  . aspirin 81 MG tablet Take 81 mg by mouth daily.      . B-D UF  III MINI PEN NEEDLES 31G X 5 MM MISC USE WITH LEVEMIR FLEXTOUCH.  150 each  11  . CRESTOR 40 MG tablet TAKE 1 TABLET (40 MG TOTAL) BY MOUTH DAILY.  30 tablet  3  . esomeprazole (NEXIUM) 40 MG capsule Take 1 capsule (40 mg total) by mouth daily.  30 capsule  3  . glucose blood test strip Check BS 4-5 x day - qam, after meals and at bedtime - Dx - 250.00  200 each  12  . glucose monitoring kit (FREESTYLE) monitoring kit 1 each by Does not apply route as needed for other.  1 each  0  . insulin aspart (NOVOLOG) 100 UNIT/ML FlexPen Inject 16 Units into the skin 3 (three) times daily with meals.  15 mL  11    . insulin aspart (NOVOLOG) 100 UNIT/ML injection Inject 16 Units into the skin 3 (three) times daily with meals.  10 mL  11  . Insulin Detemir (LEVEMIR) 100 UNIT/ML Pen 65 units qam & 60 qpm  15 pen  1  . Insulin Pen Needle 31G X 6 MM MISC Use with Levemir Flextouch Pen  100 each  0  . magnesium oxide (MAG-OX) 400 MG tablet Take 1 tablet (400 mg total) by mouth daily.  30 tablet  2  . nystatin (MYCOSTATIN) 100000 UNIT/ML suspension       . omeprazole (PRILOSEC) 20 MG capsule Take 20 mg by mouth daily.      . rosuvastatin (CRESTOR) 40 MG tablet Take 40 mg by mouth daily.      Marland Kitchen ULORIC 80 MG TABS TAKE 1 TABLET (80 MG TOTAL) BY MOUTH DAILY.  30 tablet  5  . valsartan (DIOVAN) 80 MG tablet Take 1 tablet (80 mg total) by mouth daily.  30 tablet  5   No current facility-administered medications on file prior to visit.   No Known Allergies Past Surgical History  Procedure Laterality Date  . Prostate surgery     Current Outpatient Prescriptions on File Prior to Visit  Medication Sig Dispense Refill  . aspirin 81 MG tablet Take 81 mg by mouth daily.      . B-D UF III MINI PEN NEEDLES 31G X 5 MM MISC USE WITH LEVEMIR FLEXTOUCH.  150 each  11  . CRESTOR 40 MG tablet TAKE 1 TABLET (40 MG TOTAL) BY MOUTH DAILY.  30 tablet  3  . esomeprazole (NEXIUM) 40 MG capsule Take 1 capsule (40 mg total) by mouth daily.  30 capsule  3  . glucose blood test strip Check BS 4-5 x day - qam, after meals and at bedtime - Dx - 250.00  200 each  12  . glucose monitoring kit (FREESTYLE) monitoring kit 1 each by Does not apply route as needed for other.  1 each  0  . insulin aspart (NOVOLOG) 100 UNIT/ML FlexPen Inject 16 Units into the skin 3 (three) times daily with meals.  15 mL  11  . insulin aspart (NOVOLOG) 100 UNIT/ML injection Inject 16 Units into the skin 3 (three) times daily with meals.  10 mL  11  . Insulin Detemir (LEVEMIR) 100 UNIT/ML Pen 65 units qam & 60 qpm  15 pen  1  . Insulin Pen Needle 31G X 6 MM MISC  Use with Levemir Flextouch Pen  100 each  0  . magnesium oxide (MAG-OX) 400 MG tablet Take 1 tablet (400 mg total) by mouth daily.  30 tablet  2  . nystatin (MYCOSTATIN)  100000 UNIT/ML suspension       . omeprazole (PRILOSEC) 20 MG capsule Take 20 mg by mouth daily.      . rosuvastatin (CRESTOR) 40 MG tablet Take 40 mg by mouth daily.      Marland Kitchen ULORIC 80 MG TABS TAKE 1 TABLET (80 MG TOTAL) BY MOUTH DAILY.  30 tablet  5  . valsartan (DIOVAN) 80 MG tablet Take 1 tablet (80 mg total) by mouth daily.  30 tablet  5   No current facility-administered medications on file prior to visit.   No Known Allergies History   Social History  . Marital Status: Married    Spouse Name: N/A    Number of Children: N/A  . Years of Education: N/A   Occupational History  . Not on file.   Social History Main Topics  . Smoking status: Never Smoker   . Smokeless tobacco: Never Used  . Alcohol Use: Yes     Comment: Beer prior to diagnosis of diabetes, about once a week during sports on tv  . Drug Use: No  . Sexual Activity: Not on file   Other Topics Concern  . Not on file   Social History Narrative  . No narrative on file      Review of Systems  All other systems reviewed and are negative.      Objective:   Physical Exam  Vitals reviewed. Constitutional: He is oriented to person, place, and time.  Cardiovascular: Normal rate, regular rhythm and normal heart sounds.   Pulmonary/Chest: Effort normal and breath sounds normal. No respiratory distress. He has no wheezes. He has no rales.  Abdominal: Soft. Bowel sounds are normal. He exhibits no distension. There is no tenderness. There is no rebound.  Musculoskeletal: He exhibits no edema.  Neurological: He is alert and oriented to person, place, and time. He has normal reflexes. No cranial nerve deficit. He exhibits normal muscle tone. Coordination normal.          Assessment & Plan:  Type II or unspecified type diabetes mellitus without  mention of complication, not stated as uncontrolled - Plan: COMPLETE METABOLIC PANEL WITH GFR, Hemoglobin A1c  Hypomagnesemia - Plan: Magnesium, Microalbumin, urine   Based on the sugar values the patient is recording, his diabetes is well controlled. Therefore I recommended he decrease NovoLog to 8 units with meals. I would like to try to reduce his insulin use if possible to help facilitate some weight loss. Continue Levemir at its current dose. I will check a hemoglobin A1c along with a CMP. Also check a urine microalbumin. If microalbumin is elevated, I would try to increase his Diovan if his blood pressure can tolerate it.

## 2013-11-21 ENCOUNTER — Encounter: Payer: Self-pay | Admitting: *Deleted

## 2013-11-21 ENCOUNTER — Encounter: Payer: 59 | Attending: Internal Medicine | Admitting: *Deleted

## 2013-11-21 VITALS — Ht 73.0 in | Wt 268.6 lb

## 2013-11-21 DIAGNOSIS — Z713 Dietary counseling and surveillance: Secondary | ICD-10-CM | POA: Insufficient documentation

## 2013-11-21 DIAGNOSIS — E119 Type 2 diabetes mellitus without complications: Secondary | ICD-10-CM | POA: Insufficient documentation

## 2013-11-21 NOTE — Progress Notes (Signed)
Appt start time: 0930 end time:  1000.  Assessment:  Patient was seen on 11/21/13 for individual diabetes education follow up visit. He is pleased with the great drop in his A1c, see below. He states he is having hypoglycemia especially after meals now. Due to that, he takes insulln after meal to get BG above 100. As at last visit, he states he is SMBG 3 times a day. FBG are below 100, his highest BG during the day are in the afternoon but those are never over 135 or as low as under 100 mg/dl. He takes Novolog for lunch meal and supper meal only if BG is above 100 mg/dl, so he omits his Novolog often. He states he feels symptomatic with hypoglycemia around 90 mg/dl and treats with hard candy.  Current HbA1c: 15.6%, which has now dropped to 6.45% on 11/16/13!!!  Preferred Learning Style:   No preference indicated   Learning Readiness:   Ready  Change in progress  MEDICATIONS: see list, diabetes medications are Levemir and Novolog  DIETARY INTAKE:  24-hr recall:  B ( AM):  2 pancakes and 2 [place] Toast Sticks, one pork sausage and 2 [place] sausage, and 1 bacon and two with sugar free syrup and margarine,  fresh fruit, diet soda Snk ( AM): sandwich with lean meat and mustard L ( PM): sandwich with lean meat again with vegetables OR 2 pieces grilled or fried chicken diet soda Snk ( PM): no D ( PM): lean meat, steamed vegetables, baked sweet potato fries and salad, diet soda or water  Snk ( PM): another sandwich Beverages: diet soda, water  Usual physical activity:twice a day now - inside on stationary bike with resistance or elliptical  machine in AM and weight lifiting and pushups every night. He walks outside when weather is comfortable.   Estimated energy needs: 2000 calories 225 g carbohydrates 150 g protein 56 g fat  Intervention:    Commended him on his very well controlled diabetes! Also reviewed his eating habits and they appear to be appropriate as well, with an average of  3 Carb Choices per meal instead of the 4 I had originally suggested.   Reviewed insulin action of Levemir and Novolog. Explained the difference between the basal coverage of Levemir and the meal time coverage of Novolog.  Reviewed the rationale and benefits of increased activity and the impact of exercise can be as long as 24 hours after activity is completed.   Plan:  Continue to aim for 3 Carb Choices per meal (45 grams) +/- 1 either way  Continue to aim for 0-2 Carbs per snack if hungry  Continue reading food labels for Total Carbohydrate of foods Continue with your activity level daily as tolerated, great job! Continue checking BG at alternate times per day as directed by MD  Because your BGs are too low before and after some meals, ask your MD about reducing your Levemir to 1 shot a day of about 60 units which will cut that dose in half.  Target BGs are 80-130 before meals and 100-180 after meals for an A1c of 7.0 % or below    Teaching Method Utilized: Visual, Auditory and Hands on  Handouts given during visit include: Insulin Action handout Another Meal Plan Card DM 2 Support Group Flyer  Barriers to learning/adherence to lifestyle change: none  Diabetes self-care support plan:  States his wife is supportive and knowledgeable  Demonstrated degree of understanding via:  Teach Back   Monitoring/Evaluation:  Dietary intake, exercise, reading food labels, SMBG, and body weight in 6 month(s).

## 2013-11-21 NOTE — Patient Instructions (Signed)
Plan:  Continue to aim for 3 Carb Choices per meal (45 grams) +/- 1 either way  Continue to aim for 0-2 Carbs per snack if hungry  Continue reading food labels for Total Carbohydrate of foods Continue with your activity level daily as tolerated, great job! Continue checking BG at alternate times per day as directed by MD  Because your BGs are too low before and after some meals, ask your MD about reducing your Levemir to 1 shot a day of about 60 units which will cut that dose in half.  Target BGs are 80-130 before meals and 100-180 after meals for an A1c of 7.0 % or below

## 2013-12-02 ENCOUNTER — Other Ambulatory Visit: Payer: Self-pay | Admitting: *Deleted

## 2013-12-02 DIAGNOSIS — E785 Hyperlipidemia, unspecified: Secondary | ICD-10-CM

## 2013-12-02 DIAGNOSIS — E119 Type 2 diabetes mellitus without complications: Secondary | ICD-10-CM

## 2013-12-08 ENCOUNTER — Other Ambulatory Visit: Payer: 59

## 2013-12-08 ENCOUNTER — Other Ambulatory Visit: Payer: Self-pay | Admitting: Family Medicine

## 2013-12-08 DIAGNOSIS — E785 Hyperlipidemia, unspecified: Secondary | ICD-10-CM

## 2013-12-08 DIAGNOSIS — E119 Type 2 diabetes mellitus without complications: Secondary | ICD-10-CM

## 2013-12-08 LAB — LIPID PANEL
CHOLESTEROL: 114 mg/dL (ref 0–200)
HDL: 39 mg/dL — ABNORMAL LOW (ref >39)
LDL Cholesterol: 56 mg/dL (ref 0–99)
TRIGLYCERIDES: 93 mg/dL (ref <150)
Total CHOL/HDL Ratio: 2.9 Ratio
VLDL: 19 mg/dL (ref 0–40)

## 2013-12-08 LAB — CBC WITH DIFFERENTIAL/PLATELET
BASOS PCT: 1 % (ref 0–1)
Basophils Absolute: 0.1 10*3/uL (ref 0.0–0.1)
Eosinophils Absolute: 0.2 10*3/uL (ref 0.0–0.7)
Eosinophils Relative: 3 % (ref 0–5)
HCT: 33.2 % — ABNORMAL LOW (ref 39.0–52.0)
HEMOGLOBIN: 10.9 g/dL — AB (ref 13.0–17.0)
Lymphocytes Relative: 47 % — ABNORMAL HIGH (ref 12–46)
Lymphs Abs: 3 10*3/uL (ref 0.7–4.0)
MCH: 27.3 pg (ref 26.0–34.0)
MCHC: 32.8 g/dL (ref 30.0–36.0)
MCV: 83.2 fL (ref 78.0–100.0)
MONOS PCT: 10 % (ref 3–12)
Monocytes Absolute: 0.6 10*3/uL (ref 0.1–1.0)
NEUTROS PCT: 39 % — AB (ref 43–77)
Neutro Abs: 2.5 10*3/uL (ref 1.7–7.7)
Platelets: 284 10*3/uL (ref 150–400)
RBC: 3.99 MIL/uL — AB (ref 4.22–5.81)
RDW: 15.6 % — ABNORMAL HIGH (ref 11.5–15.5)
WBC: 6.3 10*3/uL (ref 4.0–10.5)

## 2013-12-11 LAB — VITAMIN B12: Vitamin B-12: 488 pg/mL (ref 211–911)

## 2013-12-11 LAB — FERRITIN: Ferritin: 159 ng/mL (ref 22–322)

## 2013-12-11 LAB — IRON: Iron: 41 ug/dL — ABNORMAL LOW (ref 42–165)

## 2013-12-11 LAB — FOLATE: FOLATE: 14.7 ng/mL

## 2013-12-12 ENCOUNTER — Telehealth: Payer: Self-pay | Admitting: Family Medicine

## 2013-12-12 DIAGNOSIS — D649 Anemia, unspecified: Secondary | ICD-10-CM

## 2013-12-12 MED ORDER — FERROUS SULFATE 325 (65 FE) MG PO TABS: 325.0000 mg | ORAL_TABLET | Freq: Two times a day (BID) | ORAL | Status: DC

## 2013-12-12 NOTE — Telephone Encounter (Signed)
Message copied by Olena Mater on Tue Dec 12, 2013 12:31 PM ------      Message from: Jenna Luo      Created: Tue Dec 12, 2013  7:12 AM       Iron is low.  Begin feso4 325 bid immediately after stool cards x 3. ------

## 2013-12-12 NOTE — Telephone Encounter (Signed)
Message copied by Olena Mater on Tue Dec 12, 2013 12:31 PM ------      Message from: Jenna Luo      Created: Mon Dec 11, 2013  7:20 AM       Cholesterol is excellent.  Labs show mild anemia.  Please add ferritin, iron, b12, folate, and stool cards x 3. ------

## 2013-12-12 NOTE — Telephone Encounter (Signed)
Pt called back.  Explained about mild anemia and low Iron level.  Explained need for him to complete Hemoccult cards.   AFTER he returns hemoccult cards, he is to start iron supplements BID.  RX to pharmacy

## 2013-12-16 ENCOUNTER — Other Ambulatory Visit: Payer: Self-pay | Admitting: Family Medicine

## 2013-12-18 ENCOUNTER — Other Ambulatory Visit: Payer: Self-pay | Admitting: Family Medicine

## 2013-12-18 ENCOUNTER — Other Ambulatory Visit: Payer: 59

## 2013-12-18 DIAGNOSIS — D649 Anemia, unspecified: Secondary | ICD-10-CM

## 2013-12-19 ENCOUNTER — Other Ambulatory Visit: Payer: Self-pay | Admitting: Family Medicine

## 2013-12-19 LAB — FECAL OCCULT BLOOD, IMMUNOCHEMICAL
FECAL OCCULT BLOOD: NEGATIVE
FECAL OCCULT BLOOD: POSITIVE — AB
FECAL OCCULT BLOOD: POSITIVE — AB

## 2013-12-19 MED ORDER — INSULIN PEN NEEDLE 31G X 5 MM MISC: Status: DC

## 2013-12-20 ENCOUNTER — Other Ambulatory Visit: Payer: Self-pay | Admitting: Family Medicine

## 2013-12-20 DIAGNOSIS — K921 Melena: Secondary | ICD-10-CM

## 2013-12-28 ENCOUNTER — Other Ambulatory Visit: Payer: Self-pay | Admitting: Family Medicine

## 2014-01-17 ENCOUNTER — Telehealth: Payer: Self-pay | Admitting: Family Medicine

## 2014-01-17 NOTE — Telephone Encounter (Signed)
Patient had colonscopy done and the doctor told him that they would recommend a sleep study  [phone]

## 2014-01-17 NOTE — Telephone Encounter (Signed)
Bloomer.Marland Kitchen08/26/15

## 2014-01-22 ENCOUNTER — Ambulatory Visit: Payer: 59 | Admitting: Podiatry

## 2014-01-23 NOTE — Telephone Encounter (Signed)
LMTRC

## 2014-02-02 ENCOUNTER — Other Ambulatory Visit: Payer: Self-pay | Admitting: Family Medicine

## 2014-04-18 ENCOUNTER — Other Ambulatory Visit: Payer: Self-pay | Admitting: Family Medicine

## 2014-05-11 ENCOUNTER — Encounter: Payer: Self-pay | Admitting: Family Medicine

## 2014-05-11 ENCOUNTER — Ambulatory Visit (INDEPENDENT_AMBULATORY_CARE_PROVIDER_SITE_OTHER): Payer: 59 | Admitting: Family Medicine

## 2014-05-11 VITALS — BP 110/62 | HR 76 | Temp 98.2°F | Resp 16

## 2014-05-11 DIAGNOSIS — E119 Type 2 diabetes mellitus without complications: Secondary | ICD-10-CM

## 2014-05-11 DIAGNOSIS — Z23 Encounter for immunization: Secondary | ICD-10-CM

## 2014-05-11 LAB — LIPID PANEL
CHOL/HDL RATIO: 3.3 ratio
Cholesterol: 127 mg/dL (ref 0–200)
HDL: 38 mg/dL — AB (ref >39)
LDL CALC: 58 mg/dL (ref 0–99)
Triglycerides: 155 mg/dL — ABNORMAL HIGH (ref <150)
VLDL: 31 mg/dL (ref 0–40)

## 2014-05-11 LAB — CBC WITH DIFFERENTIAL/PLATELET
BASOS ABS: 0.1 10*3/uL (ref 0.0–0.1)
Basophils Relative: 1 % (ref 0–1)
Eosinophils Absolute: 0.2 10*3/uL (ref 0.0–0.7)
Eosinophils Relative: 3 % (ref 0–5)
HEMATOCRIT: 37 % — AB (ref 39.0–52.0)
Hemoglobin: 12.1 g/dL — ABNORMAL LOW (ref 13.0–17.0)
LYMPHS PCT: 50 % — AB (ref 12–46)
Lymphs Abs: 3.5 10*3/uL (ref 0.7–4.0)
MCH: 28 pg (ref 26.0–34.0)
MCHC: 32.7 g/dL (ref 30.0–36.0)
MCV: 85.6 fL (ref 78.0–100.0)
MONO ABS: 0.5 10*3/uL (ref 0.1–1.0)
MPV: 10 fL (ref 9.4–12.4)
Monocytes Relative: 7 % (ref 3–12)
Neutro Abs: 2.7 10*3/uL (ref 1.7–7.7)
Neutrophils Relative %: 39 % — ABNORMAL LOW (ref 43–77)
PLATELETS: 322 10*3/uL (ref 150–400)
RBC: 4.32 MIL/uL (ref 4.22–5.81)
RDW: 14.9 % (ref 11.5–15.5)
WBC: 7 10*3/uL (ref 4.0–10.5)

## 2014-05-11 LAB — COMPLETE METABOLIC PANEL WITH GFR
ALK PHOS: 60 U/L (ref 39–117)
ALT: 10 U/L (ref 0–53)
AST: 16 U/L (ref 0–37)
Albumin: 4.1 g/dL (ref 3.5–5.2)
BUN: 17 mg/dL (ref 6–23)
CALCIUM: 9.6 mg/dL (ref 8.4–10.5)
CHLORIDE: 104 meq/L (ref 96–112)
CO2: 25 mEq/L (ref 19–32)
CREATININE: 1.43 mg/dL — AB (ref 0.50–1.35)
GFR, Est African American: 59 mL/min — ABNORMAL LOW
GFR, Est Non African American: 51 mL/min — ABNORMAL LOW
Glucose, Bld: 86 mg/dL (ref 70–99)
POTASSIUM: 4.1 meq/L (ref 3.5–5.3)
Sodium: 139 mEq/L (ref 135–145)
Total Bilirubin: 0.3 mg/dL (ref 0.2–1.2)
Total Protein: 7.6 g/dL (ref 6.0–8.3)

## 2014-05-11 LAB — HEMOGLOBIN A1C
Hgb A1c MFr Bld: 6.6 % — ABNORMAL HIGH (ref <5.7)
Mean Plasma Glucose: 143 mg/dL — ABNORMAL HIGH (ref <117)

## 2014-05-11 MED ORDER — SILDENAFIL CITRATE 100 MG PO TABS
50.0000 mg | ORAL_TABLET | Freq: Every day | ORAL | Status: DC | PRN
Start: 2014-05-11 — End: 2016-08-14

## 2014-05-11 NOTE — Progress Notes (Signed)
Subjective:    Patient ID: Curtis Hunt, male    DOB: [birth year], 63 y.o.   MRN: [id number]  HPI Patient is a very pleasant 63 year old African-American gentleman who is here today for follow-up on his diabetes. His sugars have been extremely well controlled. He is due for hemoglobin A1c today. He denies any sugars greater than 200. He denies any hypoglycemic episodes. He denies any polyuria, polydipsia, or blurred vision. His diabetic eye exam is up-to-date. His diabetic foot exam is performed today and is completely normal. He is due for Pneumovax 23. His colonoscopy and endoscopy were performed this summer and were normal. No explanation was found for his anemia and guaiac positive stool area and he is been taking his iron on a regular basis and he is due to repeat a CBC today. Past Medical History  Diagnosis Date  . Hyperlipidemia   . Gout   . Cancer 2004    Prostate CA with surgery  . Diabetes mellitus without complication    Past Surgical History  Procedure Laterality Date  . Prostate surgery     Current Outpatient Prescriptions on File Prior to Visit  Medication Sig Dispense Refill  . aspirin 81 MG tablet Take 81 mg by mouth daily.    . CRESTOR 40 MG tablet TAKE 1 TABLET BY MOUTH EVERY DAY 30 tablet 3  . esomeprazole (NEXIUM) 40 MG capsule TAKE ONE CAPSULE BY MOUTH EVERY DAY 30 capsule 11  . ferrous sulfate 325 (65 FE) MG tablet Take 1 tablet (325 mg total) by mouth 2 (two) times daily with a meal. 60 tablet 5  . glucose blood test strip Check BS 4-5 x day - qam, after meals and at bedtime - Dx - 250.00 200 each 12  . glucose monitoring kit (FREESTYLE) monitoring kit 1 each by Does not apply route as needed for other. 1 each 0  . insulin aspart (NOVOLOG) 100 UNIT/ML FlexPen Inject 16 Units into the skin 3 (three) times daily with meals. 15 mL 11  . insulin aspart (NOVOLOG) 100 UNIT/ML injection Inject 16 Units into the skin 3 (three) times daily with meals. (Patient taking  differently: Inject 8 Units into the skin 3 (three) times daily with meals. ) 10 mL 11  . Insulin Detemir (LEVEMIR) 100 UNIT/ML Pen 65 units qam & 60 qpm 15 pen 1  . Insulin Pen Needle (B-D UF III MINI PEN NEEDLES) 31G X 5 MM MISC USE WITH LEVEMIR FLEXTOUCH. Injects BID - Dx- 250.00 100 each 11  . Insulin Pen Needle 31G X 6 MM MISC Use with Levemir Flextouch Pen 100 each 0  . omeprazole (PRILOSEC) 20 MG capsule Take 20 mg by mouth daily.    . rosuvastatin (CRESTOR) 40 MG tablet Take 40 mg by mouth daily.    Marland Kitchen ULORIC 80 MG TABS TAKE 1 TABLET BY MOUTH ONCE A DAY 30 tablet 5  . valsartan (DIOVAN) 80 MG tablet TAKE 1 TABLET BY MOUTH EVERY DAY 30 tablet 5   No current facility-administered medications on file prior to visit.   No Known Allergies History   Social History  . Marital Status: Married    Spouse Name: N/A    Number of Children: N/A  . Years of Education: N/A   Occupational History  . Not on file.   Social History Main Topics  . Smoking status: Never Smoker   . Smokeless tobacco: Never Used  . Alcohol Use: Yes     Comment: Beer prior to  diagnosis of diabetes, about once a week during sports on tv  . Drug Use: No  . Sexual Activity: Not on file   Other Topics Concern  . Not on file   Social History Narrative      Review of Systems  All other systems reviewed and are negative.      Objective:   Physical Exam  Constitutional: He appears well-developed and well-nourished.  Cardiovascular: Normal rate, regular rhythm, normal heart sounds and intact distal pulses.   No murmur heard. Pulmonary/Chest: Effort normal and breath sounds normal. No respiratory distress. He has no wheezes. He has no rales.  Abdominal: Soft. Bowel sounds are normal. He exhibits no distension. There is no tenderness. There is no rebound and no guarding.  Musculoskeletal: He exhibits no edema.  Vitals reviewed.         Assessment & Plan:  Diabetes mellitus type II, controlled - Plan:  COMPLETE METABOLIC PANEL WITH GFR, CBC with Differential, Lipid panel, Hemoglobin A1c, Microalbumin, urine  Patient's blood pressure is excellent. I will check a hemoglobin A1c, urine microalbumin, and a fasting lipid panel. Goal hemoglobin A1c is less than 7. Goal LDL cholesterol is less than 100. Patient's flu shot, diabetic eye exam, and diabetic foot exam are up-to-date. Patient received Pneumovax 23 today in clinic.  He also requested a prescription of Viagra for erectile dysfunction. I will send 100 mg pills to his pharmacy. He can take 1/2-1 tablet as needed for erectile dysfunction.

## 2014-05-11 NOTE — Addendum Note (Signed)
Addended by: Shary Decamp B on: 05/11/2014 10:08 AM   Modules accepted: Orders

## 2014-05-12 LAB — MICROALBUMIN, URINE: Microalb, Ur: 2.9 mg/dL — ABNORMAL HIGH (ref <2.0)

## 2014-05-14 ENCOUNTER — Encounter: Payer: Self-pay | Admitting: Family Medicine

## 2014-05-14 MED ORDER — VALSARTAN 160 MG PO TABS: 160.0000 mg | ORAL_TABLET | Freq: Every day | ORAL | Status: DC

## 2014-05-23 ENCOUNTER — Encounter: Payer: 59 | Attending: Family Medicine | Admitting: *Deleted

## 2014-05-23 DIAGNOSIS — E119 Type 2 diabetes mellitus without complications: Secondary | ICD-10-CM | POA: Diagnosis present

## 2014-05-23 DIAGNOSIS — Z713 Dietary counseling and surveillance: Secondary | ICD-10-CM | POA: Diagnosis not present

## 2014-05-23 NOTE — Patient Instructions (Signed)
Plan:  Continue to aim for 3 Carb Choices per meal (45 grams) +/- 1 either way  Continue to aim for 0-2 Carbs per snack if hungry  Continue reading food labels for Total Carbohydrate of foods Continue with your activity level daily as tolerated, great job! Continue checking BG at alternate times per day as directed by MD  Because your BGs are too low before and after some meals, ask your MD about reducing your evening Levemir to 30 units along with the 30 units you take in the AM to help reduce your risk for low BG Consider taking the Novolog only before meals if BG is above 100 mg/dl, not for snacks unless that BG is above 150 mg/dl due to risk of stacking insulin

## 2014-05-23 NOTE — Progress Notes (Signed)
Appt start time: 0930 end time:  1000.  Assessment:  Patient was seen on 05/23/14 for individual diabetes education follow up visit. He is pleased with the continued drop in his A1c, see below.He states he continues to have hypoglycemia almost daily. He continues to take Novolog insulin only if pre-meal BG is above 100, so he omits his Novolog often. He states he feels symptomatic with hypoglycemia around 90 mg/dl and treats with hard candy. As at last visit, he states he is SMBG 3 times a day. He states his MD did reduce his Levemir from 60 to 30 units in the AM, but continues to take 60 units in PM.   Current HbA1c: which is now  6.5%   Preferred Learning Style:   No preference indicated   Learning Readiness:   Ready  Change in progress  MEDICATIONS: see list, diabetes medications are Levemir and Novolog  DIETARY INTAKE:  24-hr recall:  B ( AM):  3 waffles at 15 grams each, 1 bacon fresh fruit, diet soda Snk ( AM): sandwich with lean meat and mustard due to low BG only L ( PM): sandwich with lean meat again with vegetables OR 2 pieces grilled or fried chicken diet soda Snk ( PM): meat sandwich with cheese, diet soda D ( PM): lean meat, steamed vegetables, baked sweet potato fries and salad, diet soda or water  Snk ( PM): another sandwich Beverages: diet soda, water  Usual physical activity:twice a day now - inside on stationary bike with resistance or elliptical  machine in AM and weight lifiting and pushups every night. He walks outside when weather is comfortable.   Estimated energy needs: 2000 calories 225 g carbohydrates 150 g protein 56 g fat  Intervention:    Commended him on his very well controlled diabetes! He states he is eating up to 3 Carb Choices per meal,usually less.   Reviewed insulin action of Levemir and Novolog. Explained the difference between the basal coverage of Levemir and the meal time coverage of Novolog. Explained that the Novolog lasts in his  body for 4 hours, so I am recommending he take the Novolog for meals, not for snacks to avoid potential stacking of insulin.  Reviewed the rationale and benefits of increased activity and the impact of exercise can be as long as 24 hours after activity is completed.   Plan:  Continue to aim for 3 Carb Choices per meal (45 grams) +/- 1 either way  Continue to aim for 0-2 Carbs per snack if hungry  Continue reading food labels for Total Carbohydrate of foods Continue with your activity level daily as tolerated, great job! Continue checking BG at alternate times per day as directed by MD  Because your BGs are too low before and after some meals, ask your MD about reducing your evening Levemir to 30 units along with the 30 units you take in the AM to help reduce your risk for low BG Consider taking the Novolog only before meals if BG is above 100 mg/dl, not for snacks unless that BG is above 150 mg/dl due to risk of stacking insulin    Teaching Method Utilized: Visual, Auditory and Hands on  Handouts given during visit include: Insulin Action handout  Barriers to learning/adherence to lifestyle change: none  Diabetes self-care support plan:  States his wife is supportive and knowledgeable  Demonstrated degree of understanding via:  Teach Back   Monitoring/Evaluation:  Dietary intake, exercise, reading food labels, SMBG, and body weight in  6 month(s).

## 2014-05-26 ENCOUNTER — Other Ambulatory Visit: Payer: Self-pay | Admitting: Family Medicine

## 2014-05-26 ENCOUNTER — Other Ambulatory Visit: Payer: Self-pay | Admitting: Physician Assistant

## 2014-05-28 NOTE — Telephone Encounter (Signed)
Medication refilled per protocol. 

## 2014-06-20 ENCOUNTER — Other Ambulatory Visit: Payer: Self-pay | Admitting: Family Medicine

## 2014-06-20 NOTE — Telephone Encounter (Signed)
Medication refilled per protocol. 

## 2014-06-26 ENCOUNTER — Other Ambulatory Visit: Payer: Self-pay | Admitting: Family Medicine

## 2014-06-26 MED ORDER — INSULIN DETEMIR 100 UNIT/ML FLEXPEN: PEN_INJECTOR | SUBCUTANEOUS | Status: DC

## 2014-06-26 NOTE — Telephone Encounter (Signed)
Med sent to pharm 

## 2014-07-06 ENCOUNTER — Other Ambulatory Visit: Payer: Self-pay | Admitting: Family Medicine

## 2014-07-16 ENCOUNTER — Other Ambulatory Visit: Payer: Self-pay | Admitting: Family Medicine

## 2014-07-19 ENCOUNTER — Other Ambulatory Visit: Payer: Self-pay | Admitting: Family Medicine

## 2014-07-28 ENCOUNTER — Other Ambulatory Visit: Payer: Self-pay | Admitting: Family Medicine

## 2014-07-30 ENCOUNTER — Ambulatory Visit: Payer: Self-pay | Admitting: Podiatry

## 2014-08-06 ENCOUNTER — Encounter: Payer: Self-pay | Admitting: Podiatry

## 2014-08-06 ENCOUNTER — Ambulatory Visit (INDEPENDENT_AMBULATORY_CARE_PROVIDER_SITE_OTHER): Payer: 59 | Admitting: Podiatry

## 2014-08-06 DIAGNOSIS — B351 Tinea unguium: Secondary | ICD-10-CM | POA: Diagnosis not present

## 2014-08-06 DIAGNOSIS — M79676 Pain in unspecified toe(s): Secondary | ICD-10-CM

## 2014-08-06 NOTE — Patient Instructions (Signed)
Diabetes and Foot Care Diabetes may cause you to have problems because of poor blood supply (circulation) to your feet and legs. This may cause the skin on your feet to become thinner, break easier, and heal more slowly. Your skin may become dry, and the skin may peel and crack. You may also have nerve damage in your legs and feet causing decreased feeling in them. You may not notice minor injuries to your feet that could lead to infections or more serious problems. Taking care of your feet is one of the most important things you can do for yourself.  HOME CARE INSTRUCTIONS  Wear shoes at all times, even in the house. Do not go barefoot. Bare feet are easily injured.  Check your feet daily for blisters, cuts, and redness. If you cannot see the bottom of your feet, use a mirror or ask someone for help.  Wash your feet with warm water (do not use hot water) and mild soap. Then pat your feet and the areas between your toes until they are completely dry. Do not soak your feet as this can dry your skin.  Apply a moisturizing lotion or petroleum jelly (that does not contain alcohol and is unscented) to the skin on your feet and to dry, brittle toenails. Do not apply lotion between your toes.  Trim your toenails straight across. Do not dig under them or around the cuticle. File the edges of your nails with an emery board or nail file.  Do not cut corns or calluses or try to remove them with medicine.  Wear clean socks or stockings every day. Make sure they are not too tight. Do not wear knee-high stockings since they may decrease blood flow to your legs.  Wear shoes that fit properly and have enough cushioning. To break in new shoes, wear them for just a few hours a day. This prevents you from injuring your feet. Always look in your shoes before you put them on to be sure there are no objects inside.  Do not cross your legs. This may decrease the blood flow to your feet.  If you find a minor scrape,  cut, or break in the skin on your feet, keep it and the skin around it clean and dry. These areas may be cleansed with mild soap and water. Do not cleanse the area with peroxide, alcohol, or iodine.  When you remove an adhesive bandage, be sure not to damage the skin around it.  If you have a wound, look at it several times a day to make sure it is healing.  Do not use heating pads or hot water bottles. They may burn your skin. If you have lost feeling in your feet or legs, you may not know it is happening until it is too late.  Make sure your health care provider performs a complete foot exam at least annually or more often if you have foot problems. Report any cuts, sores, or bruises to your health care provider immediately. SEEK MEDICAL CARE IF:   You have an injury that is not healing.  You have cuts or breaks in the skin.  You have an ingrown nail.  You notice redness on your legs or feet.  You feel burning or tingling in your legs or feet.  You have pain or cramps in your legs and feet.  Your legs or feet are numb.  Your feet always feel cold. SEEK IMMEDIATE MEDICAL CARE IF:   There is increasing redness,   swelling, or pain in or around a wound.  There is a red line that goes up your leg.  Pus is coming from a wound.  You develop a fever or as directed by your health care provider.  You notice a bad smell coming from an ulcer or wound. Document Released: 05/08/2000 Document Revised: 01/11/2013 Document Reviewed: 10/18/2012 Exit[employer] Patient Information 2015 [employer]. This information is not intended to replace advice given to you by your health care provider. Make sure you discuss any questions you have with your health care provider.  

## 2014-08-07 NOTE — Progress Notes (Signed)
Patient ID: Curtis Hunt, male   DOB: Mar 19, 1951, 64 y.o.   MRN: [id number]  Subjective: This patient presents again complaining of painful toenails and requests toenail debridement  Objective: The toenails are elongated, discolored, hypertrophic and tender to direct palpation 6-10  Assessment: Symptomatic onychomycoses 6-10  Plan: Debridement toenails 10 without any bleeding  Reappoint 3 month

## 2014-08-08 ENCOUNTER — Other Ambulatory Visit: Payer: Self-pay | Admitting: Family Medicine

## 2014-08-20 ENCOUNTER — Ambulatory Visit (INDEPENDENT_AMBULATORY_CARE_PROVIDER_SITE_OTHER): Payer: 59 | Admitting: Family Medicine

## 2014-08-20 ENCOUNTER — Encounter: Payer: Self-pay | Admitting: Family Medicine

## 2014-08-20 VITALS — BP 128/80 | HR 62 | Temp 98.0°F | Resp 16 | Ht 73.0 in | Wt 278.0 lb

## 2014-08-20 DIAGNOSIS — Z111 Encounter for screening for respiratory tuberculosis: Secondary | ICD-10-CM

## 2014-08-20 DIAGNOSIS — E119 Type 2 diabetes mellitus without complications: Secondary | ICD-10-CM | POA: Diagnosis not present

## 2014-08-20 DIAGNOSIS — E785 Hyperlipidemia, unspecified: Secondary | ICD-10-CM | POA: Diagnosis not present

## 2014-08-20 LAB — COMPLETE METABOLIC PANEL WITH GFR
ALT: 11 U/L (ref 0–53)
AST: 15 U/L (ref 0–37)
Albumin: 4.1 g/dL (ref 3.5–5.2)
Alkaline Phosphatase: 56 U/L (ref 39–117)
BILIRUBIN TOTAL: 0.3 mg/dL (ref 0.2–1.2)
BUN: 23 mg/dL (ref 6–23)
CO2: 27 mEq/L (ref 19–32)
Calcium: 9.4 mg/dL (ref 8.4–10.5)
Chloride: 104 mEq/L (ref 96–112)
Creat: 1.48 mg/dL — ABNORMAL HIGH (ref 0.50–1.35)
GFR, EST NON AFRICAN AMERICAN: 49 mL/min — AB
GFR, Est African American: 57 mL/min — ABNORMAL LOW
GLUCOSE: 87 mg/dL (ref 70–99)
Potassium: 4.4 mEq/L (ref 3.5–5.3)
SODIUM: 139 meq/L (ref 135–145)
Total Protein: 7.6 g/dL (ref 6.0–8.3)

## 2014-08-20 LAB — CBC WITH DIFFERENTIAL/PLATELET
BASOS ABS: 0.1 10*3/uL (ref 0.0–0.1)
BASOS PCT: 1 % (ref 0–1)
Eosinophils Absolute: 0.2 10*3/uL (ref 0.0–0.7)
Eosinophils Relative: 3 % (ref 0–5)
HCT: 35 % — ABNORMAL LOW (ref 39.0–52.0)
HEMOGLOBIN: 11.3 g/dL — AB (ref 13.0–17.0)
LYMPHS PCT: 53 % — AB (ref 12–46)
Lymphs Abs: 3.3 10*3/uL (ref 0.7–4.0)
MCH: 27.6 pg (ref 26.0–34.0)
MCHC: 32.3 g/dL (ref 30.0–36.0)
MCV: 85.6 fL (ref 78.0–100.0)
MONOS PCT: 7 % (ref 3–12)
MPV: 10 fL (ref 8.6–12.4)
Monocytes Absolute: 0.4 10*3/uL (ref 0.1–1.0)
NEUTROS ABS: 2.3 10*3/uL (ref 1.7–7.7)
Neutrophils Relative %: 36 % — ABNORMAL LOW (ref 43–77)
PLATELETS: 300 10*3/uL (ref 150–400)
RBC: 4.09 MIL/uL — AB (ref 4.22–5.81)
RDW: 15.8 % — ABNORMAL HIGH (ref 11.5–15.5)
WBC: 6.3 10*3/uL (ref 4.0–10.5)

## 2014-08-20 LAB — LIPID PANEL
CHOLESTEROL: 116 mg/dL (ref 0–200)
HDL: 39 mg/dL — ABNORMAL LOW (ref >=40)
LDL Cholesterol: 47 mg/dL (ref 0–99)
Total CHOL/HDL Ratio: 3 Ratio
Triglycerides: 149 mg/dL (ref <150)
VLDL: 30 mg/dL (ref 0–40)

## 2014-08-20 LAB — HEMOGLOBIN A1C
Hgb A1c MFr Bld: 6.6 % — ABNORMAL HIGH (ref <5.7)
Mean Plasma Glucose: 143 mg/dL — ABNORMAL HIGH (ref <117)

## 2014-08-20 NOTE — Addendum Note (Signed)
Addended by: Shary Decamp B on: 08/20/2014 11:00 AM   Modules accepted: Orders

## 2014-08-20 NOTE — Progress Notes (Signed)
Subjective:    Patient ID: Curtis Hunt, male    DOB: 05/12/1951, 64 y.o.   MRN: [id number]  HPI  Patient is a very pleasant 64 year old African-American male who is here today for follow-up. He is currently on Levemir 60 units at night. He takes NovoLog 8 units with meals if his sugar prior to the meal is over 100. He denies any hypoglycemia. His fasting blood sugars are under 110. His two-hour postprandial sugars are under 140. His sugars seem to be controlled well. However he is disheartened by how difficult it is for him to lose weight on the insulin. He is trying to monitor his diet and exercise but with little success. Past Medical History  Diagnosis Date  . Hyperlipidemia   . Gout   . Cancer 2004    Prostate CA with surgery  . Diabetes mellitus without complication    Past Surgical History  Procedure Laterality Date  . Prostate surgery     Current Outpatient Prescriptions on File Prior to Visit  Medication Sig Dispense Refill  . aspirin 81 MG tablet Take 81 mg by mouth daily.    . CRESTOR 40 MG tablet TAKE 1 TABLET BY MOUTH EVERY DAY 30 tablet 3  . esomeprazole (NEXIUM) 40 MG capsule TAKE ONE CAPSULE BY MOUTH EVERY DAY 30 capsule 11  . ferrous sulfate 325 (65 FE) MG tablet TAKE 1 TABLET (325 MG TOTAL) BY MOUTH 2 (TWO) TIMES DAILY WITH A MEAL. 60 tablet 5  . glucose monitoring kit (FREESTYLE) monitoring kit 1 each by Does not apply route as needed for other. 1 each 0  . insulin aspart (NOVOLOG) 100 UNIT/ML injection Inject 16 Units into the skin 3 (three) times daily with meals. (Patient taking differently: Inject 8 Units into the skin 3 (three) times daily with meals. ) 10 mL 11  . Insulin Detemir (LEVEMIR FLEXTOUCH) 100 UNIT/ML Pen INJECT 65 UNITS EVERY AM WITH BREAKFAST... + INJECT 60 UNITS INTO THE SKIN DAILY AT 10 PM. (Patient taking differently: Inject 60 Units into the skin daily at 10 pm. INJECT 60 UNITS INTO THE SKIN DAILY AT 10 PM.) 30 pen 5  . Insulin Pen Needle (B-D  UF III MINI PEN NEEDLES) 31G X 5 MM MISC USE WITH LEVEMIR FLEXTOUCH. Injects BID - Dx- 250.00 100 each 11  . Insulin Pen Needle 31G X 6 MM MISC Use with Levemir Flextouch Pen 100 each 0  . NOVOLOG FLEXPEN 100 UNIT/ML FlexPen INJECT 16 UNITS INTO THE SKIN 3 (THREE) TIMES DAILY WITH MEALS. 15 pen 4  . omeprazole (PRILOSEC) 20 MG capsule TAKE 1 CAPSULE BY MOUTH ONCE A DAY 30 capsule 11  . ONE TOUCH ULTRA TEST test strip CHECK SUGAR 4-5 TIMES A DAY.EVERY MORNING AFTER MEALS AND AT BEDTIME 200 each 8  . rosuvastatin (CRESTOR) 40 MG tablet Take 40 mg by mouth daily.    . sildenafil (VIAGRA) 100 MG tablet Take 0.5-1 tablets (50-100 mg total) by mouth daily as needed for erectile dysfunction. 5 tablet 11  . ULORIC 80 MG TABS TAKE 1 TABLET BY MOUTH ONCE A DAY 30 tablet 5  . ULORIC 80 MG TABS TAKE 1 TABLET BY MOUTH ONCE A DAY 30 tablet 4  . valsartan (DIOVAN) 160 MG tablet Take 1 tablet (160 mg total) by mouth daily. 30 tablet 11   No current facility-administered medications on file prior to visit.   No Known Allergies History   Social History  . Marital Status: Married  Spouse Name: N/A  . Number of Children: N/A  . Years of Education: N/A   Occupational History  . Not on file.   Social History Main Topics  . Smoking status: Never Smoker   . Smokeless tobacco: Never Used  . Alcohol Use: Yes     Comment: Beer prior to diagnosis of diabetes, about once a week during sports on tv  . Drug Use: No  . Sexual Activity: Not on file   Other Topics Concern  . Not on file   Social History Narrative     dReview of Systems  All other systems reviewed and are negative.      Objective:   Physical Exam  Cardiovascular: Normal rate, regular rhythm and normal heart sounds.  Exam reveals no gallop.   No murmur heard. Pulmonary/Chest: Effort normal and breath sounds normal. No respiratory distress. He has no wheezes. He has no rales. He exhibits no tenderness.  Abdominal: Soft. Bowel sounds  are normal. He exhibits no distension and no mass. There is no tenderness. There is no rebound and no guarding.  Musculoskeletal: He exhibits no edema.  Vitals reviewed.         Assessment & Plan:  Diabetes mellitus type II, controlled - Plan: CBC with Differential/Platelet, COMPLETE METABOLIC PANEL WITH GFR, Hemoglobin A1c, Microalbumin, urine, Lipid panel  HLD (hyperlipidemia)  Patient's blood pressure is excellent. I will check a fasting lipid panel. Goal LDL cholesterol is less than 100. To try to help facilitate weight loss in this individual I will decrease his Levemir to 30 units a day. Continue NovoLog as he is currently taking it. Replace the discontinued portion of the Levemir by starting the patient on Invokana 300 mg poqday and recheck in 3 months.  We may need to increase Levemir if his sugars fall out of control after making this change. I am hoping by decreasing his insulin use and starting the patient on Invokana we can help facilitate some weight loss.

## 2014-08-21 ENCOUNTER — Encounter: Payer: Self-pay | Admitting: Family Medicine

## 2014-08-21 LAB — MICROALBUMIN, URINE: MICROALB UR: 1.5 mg/dL (ref <2.0)

## 2014-08-23 ENCOUNTER — Encounter: Payer: Self-pay | Admitting: Family Medicine

## 2014-08-23 ENCOUNTER — Ambulatory Visit: Payer: 59 | Admitting: *Deleted

## 2014-08-23 DIAGNOSIS — Z111 Encounter for screening for respiratory tuberculosis: Secondary | ICD-10-CM

## 2014-08-23 LAB — TB SKIN TEST
Induration: 0 mm
TB Skin Test: NEGATIVE

## 2014-08-23 NOTE — Progress Notes (Signed)
Patient ID: Curtis Hunt, male   DOB: 04-Jun-1950, 64 y.o.   MRN: [id number] Patient seen in office today to have TB skin test read.   No induration or redness noted.   TB test negative.

## 2014-09-04 ENCOUNTER — Encounter: Payer: Self-pay | Admitting: Family Medicine

## 2014-09-05 MED ORDER — CANAGLIFLOZIN 300 MG PO TABS: 300.0000 mg | ORAL_TABLET | Freq: Every day | ORAL | Status: DC

## 2014-11-07 ENCOUNTER — Encounter: Payer: Self-pay | Admitting: Family Medicine

## 2014-11-07 MED ORDER — ONETOUCH ULTRA 2 W/DEVICE KIT: PACK | Status: DC

## 2014-11-07 MED ORDER — GLUCOSE BLOOD VI STRP: ORAL_STRIP | Status: DC

## 2014-11-12 ENCOUNTER — Ambulatory Visit: Payer: 59 | Admitting: Podiatry

## 2014-11-20 ENCOUNTER — Ambulatory Visit: Payer: 59 | Admitting: Podiatry

## 2014-11-22 ENCOUNTER — Ambulatory Visit: Payer: 59 | Admitting: *Deleted

## 2014-12-06 ENCOUNTER — Other Ambulatory Visit: Payer: Self-pay | Admitting: Family Medicine

## 2014-12-06 NOTE — Telephone Encounter (Signed)
Medication refilled per protocol. 

## 2014-12-12 ENCOUNTER — Other Ambulatory Visit: Payer: Self-pay | Admitting: Family Medicine

## 2014-12-12 NOTE — Telephone Encounter (Signed)
Medication refilled per protocol. 

## 2014-12-16 IMAGING — CT CT ABDOMEN WO/W CM
2 of 9 series · 11 of 46 positions shown, 18 images · IV contrast (CONTRAST)
Comparison: US RENAL dated 06/09/2013

CLINICAL DATA: Indeterminate left kidney on ultrasound. Mass versus
dromedary hump.

EXAM:
CT ABDOMEN WITHOUT AND WITH CONTRAST
TECHNIQUE: Multidetector CT imaging of the abdomen was performed following the
standard protocol before and following the bolus administration of
intravenous contrast.
CONTRAST:  100mL OMNIPAQUE IOHEXOL 350 MG/ML SOLN

[Series 10: arterial st · axial · arterial · 0.88mm/px · z∈[-312,-65]mm · 9 of 125 slices shown, 15 images]
[im 13/125  soft-tissue]
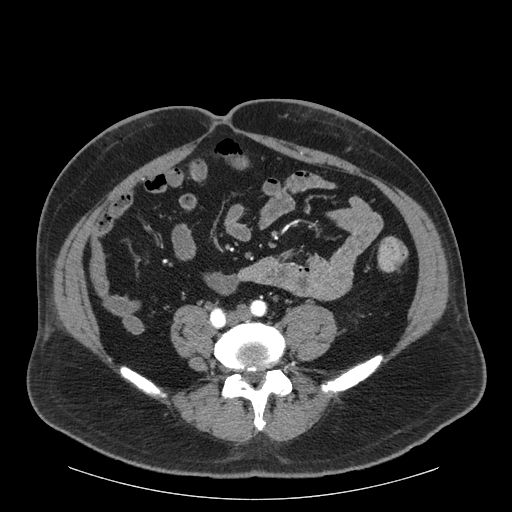
[im 13/125  bone]
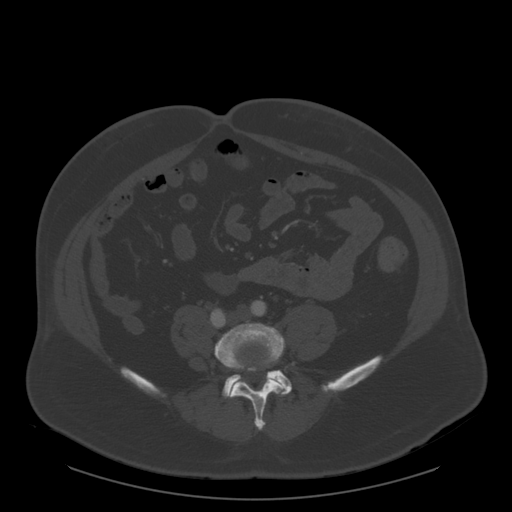
[im 25/125  soft-tissue]
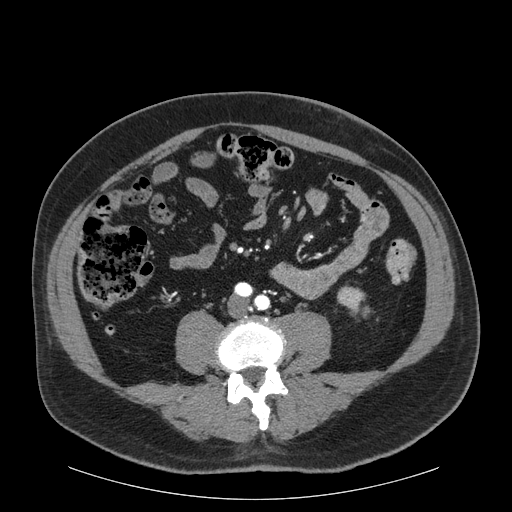
[im 38/125  soft-tissue]
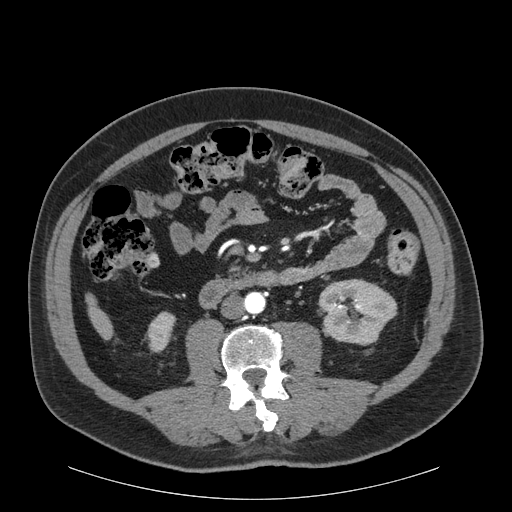
[im 50/125  soft-tissue]
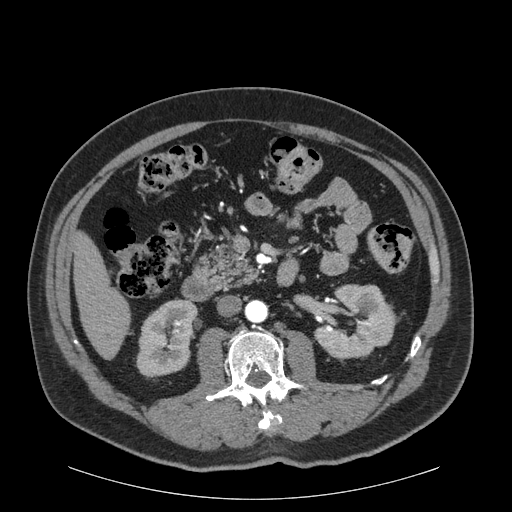
[im 63/125  soft-tissue]
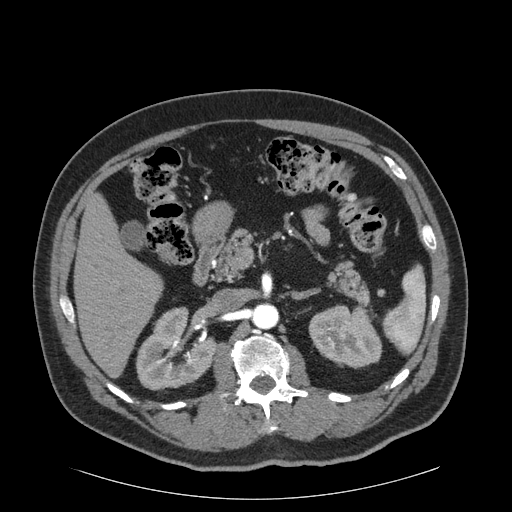
[im 75/125  soft-tissue]
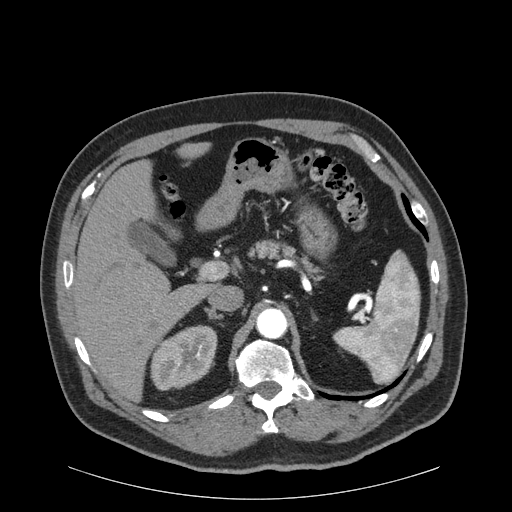
[im 75/125  lung]
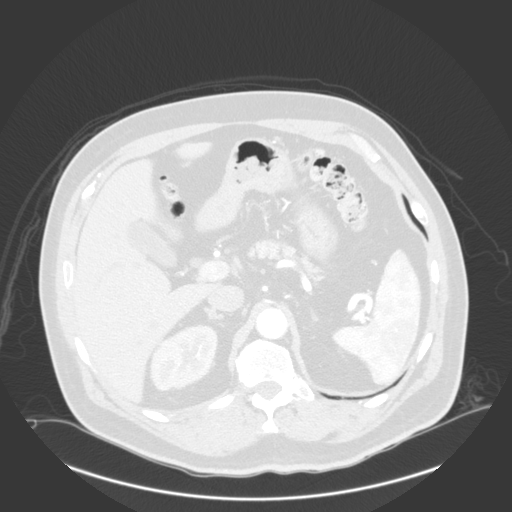
[im 87/125  soft-tissue]
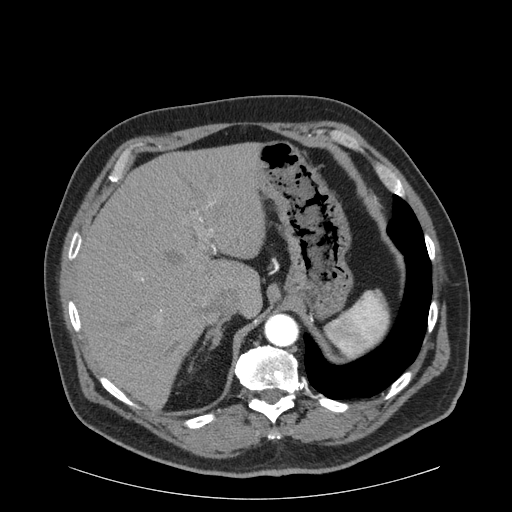
[im 87/125  lung]
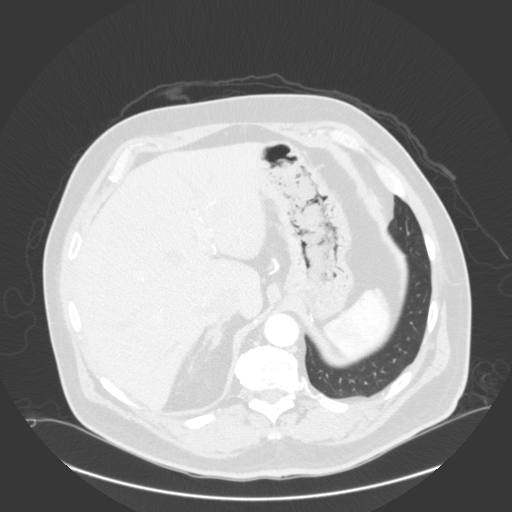
[im 100/125  soft-tissue]
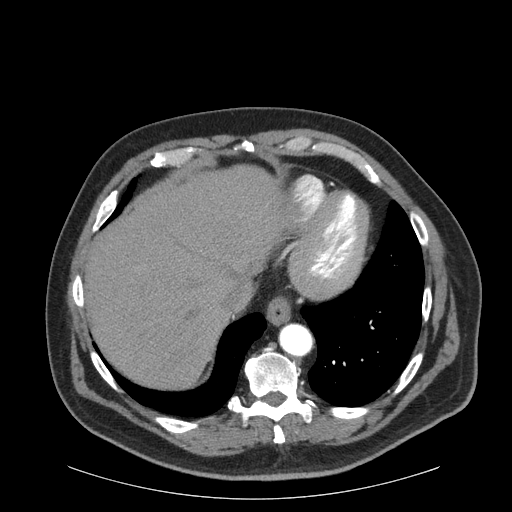
[im 100/125  lung]
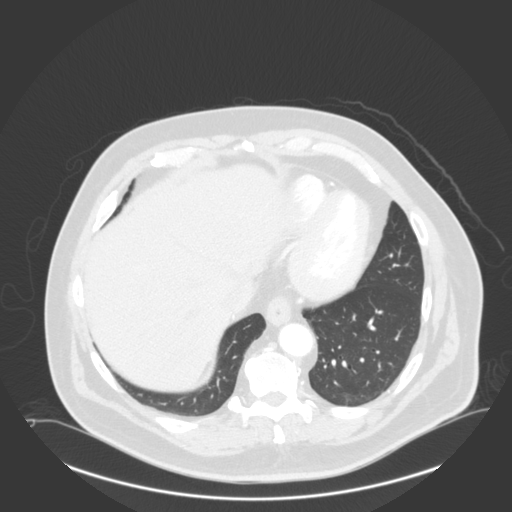
[im 112/125  soft-tissue]
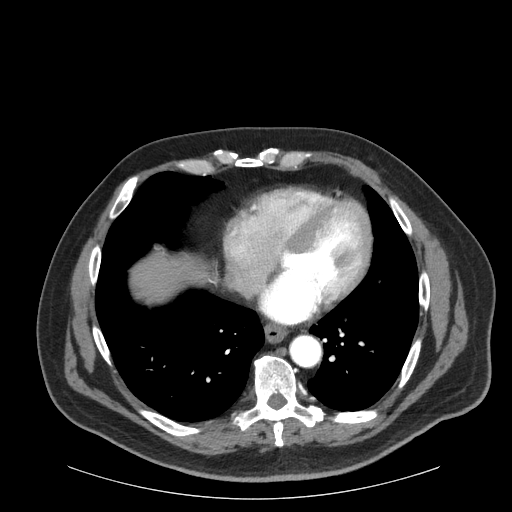
[im 112/125  lung]
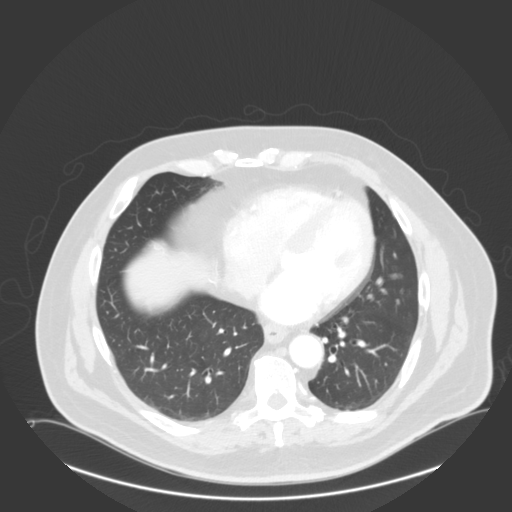
[im 112/125  bone]
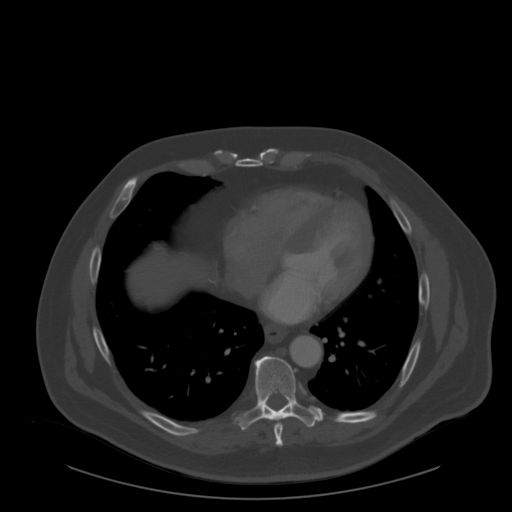

[w/o coronal · coronal · non-contrast · 0.88mm/px · 2 of 107 slices shown, 3 images]
[im 36/107  soft-tissue]
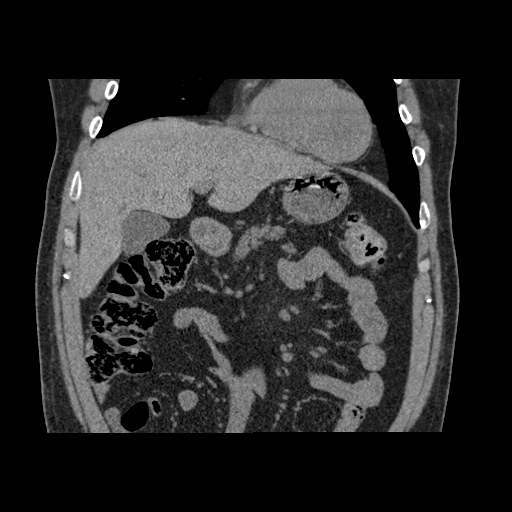
[im 36/107  bone]
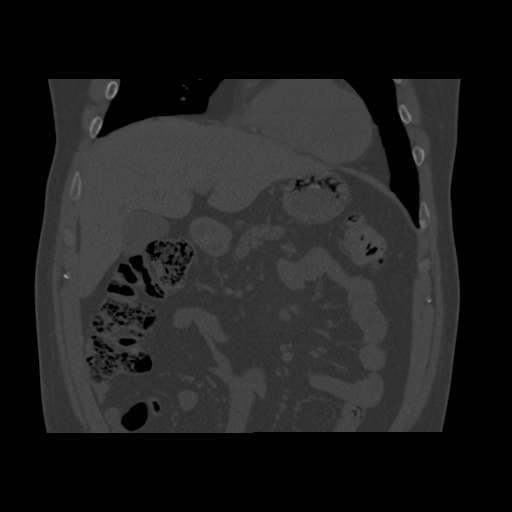
[im 71/107  soft-tissue]
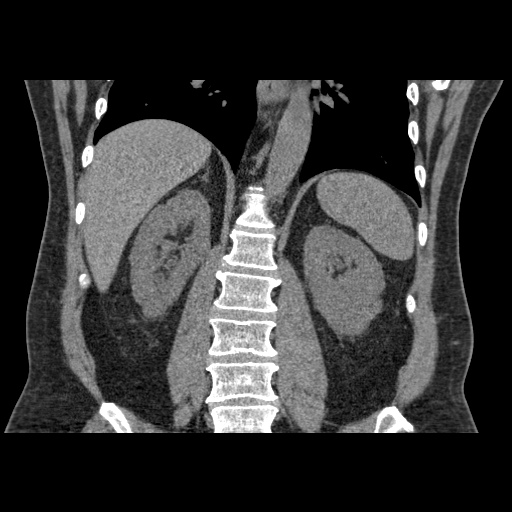

[11 of 46 positions shown; findings below may reference images not displayed]

FINDINGS: Lower Chest: Pleural-based right lower lobe lung nodule of 5 mm on
image 3/series 13. Normal heart size without pericardial or pleural
effusion.

Abdomen: No renal calculi or other calcified lesions on unenhanced
images.

No hypervascular lesions within the liver, pancreas, or kidneys on
arterial phase images.

Nephrographic phase images demonstrate normal appearance of the
liver. Small splenules. Normal stomach, pancreas, gallbladder,
biliary tract, adrenal glands.

No evidence of renal mass or hydronephrosis. Aortic and branch
vessel atherosclerosis. No retroperitoneal or retrocrural
adenopathy. Scattered colonic diverticula. Normal terminal ileum and
appendix. Normal abdominal small bowel loops. There is increased
density within the jejunal mesenteric fat with small nodes within.
Example image 84/series 11.

Bones/Musculoskeletal:  No acute osseous abnormality.
IMPRESSION: 1. No evidence of left renal mass. The ultrasound abnormality was
likely artifactual or secondary to fetal lobulation.
2.  No acute abdominal process.
3. Jejunal mesenteric findings which are suspicious for mesenteric
adenitis/panniculitis.
4. 5 mm right lower lobe lung nodule. If the patient is at high risk
for bronchogenic carcinoma, follow-up chest CT at 6-12 months is
recommended. If the patient is at low risk for bronchogenic
carcinoma, follow-up chest CT at 12 months is recommended. This
recommendation follows the consensus statement: "Guidelines for
Management of Small Pulmonary Nodules Detected on CT Scans: A
Statement from the [HOSPITAL]" as published in
[facility];[DATE]. Online at:
[URL]

## 2014-12-18 ENCOUNTER — Ambulatory Visit: Payer: Commercial Managed Care - HMO | Admitting: Podiatry

## 2014-12-20 ENCOUNTER — Encounter: Payer: Self-pay | Admitting: *Deleted

## 2014-12-20 ENCOUNTER — Encounter: Payer: Commercial Managed Care - HMO | Attending: Family Medicine | Admitting: *Deleted

## 2014-12-20 VITALS — Ht 73.0 in | Wt 267.3 lb

## 2014-12-20 DIAGNOSIS — Z6835 Body mass index (BMI) 35.0-35.9, adult: Secondary | ICD-10-CM | POA: Insufficient documentation

## 2014-12-20 DIAGNOSIS — Z713 Dietary counseling and surveillance: Secondary | ICD-10-CM | POA: Insufficient documentation

## 2014-12-20 DIAGNOSIS — E119 Type 2 diabetes mellitus without complications: Secondary | ICD-10-CM | POA: Insufficient documentation

## 2014-12-20 NOTE — Progress Notes (Signed)
Appt start time: 0945 end time:  1015.  Assessment:  Patient was seen on 05/23/14 for individual diabetes education follow up visit. Is now on Invokana. His Levemir has been reduced to just 30 units once a day and he reports his BG's are doing well. He is looking forward to seeing his next A1c and plans to discuss possibility of coming off of insulin completely. He continues to be comfortable with Carb Counting, he states he is reading food labels and is active most every day.  Current HbA1c: which is now  6.5%   Preferred Learning Style:   No preference indicated   Learning Readiness:   Ready  Change in progress  MEDICATIONS: see list, diabetes medications are Levemir and Invokana. Infrequently he takes Novolog if BG is above 100 mg/dl.  DIETARY INTAKE:  24-hr recall:  B (6 AM):  2 waffles at 15 grams each, 1 bacon, sliced tomato with light salad dressing, fresh fruit, diet soda Snk (9 AM): sandwich with lean meat and mustard or chicken wrap due to hunger L ( PM): sandwich with lean meat again with vegetables OR 2 pieces grilled or fried chicken with brown rice, diet soda Snk ( PM): meat sandwich with cheese, diet soda D ( PM): lean meat, steamed vegetables, baked sweet potato fries and salad, diet soda or water  Snk ( PM): another sandwich OR fig newton bar Beverages: diet soda, water  Usual physical activity:twice a day now - inside on stationary bike with resistance or elliptical  machine in AM and weight lifiting and pushups every night. He walks outside behind lawn mowerwhen weather is comfortable.   Estimated energy needs: 2000 calories 225 g carbohydrates 150 g protein 56 g fat  Intervention:    Commended him again on his very well controlled diabetes! He states he is eating up to 3 Carb Choices per meal,usually less.   Reviewed action of Invokana this visit.  Reviewed the rationale and benefits of increased activity and the impact of exercise can be as long as 24  hours after activity is completed.   Plan:  Continue to aim for 3 Carb Choices per meal (45 grams) +/- 1 either way  Continue to aim for 0-2 Carbs per snack if hungry  Continue reading food labels for Total Carbohydrate of foods Continue with your activity level daily as tolerated, great job! Continue checking BG at alternate times per day as directed by MD  Continue taking yourLevemir to 30 units at night per MD  Continue taking the Novolog only before meals if BG is above 100 mg/dl    Teaching Method Utilized: Visual, Auditory and Hands on  Handouts given during visit include: Diabetes Medication handout  Barriers to learning/adherence to lifestyle change: none  Diabetes self-care support plan:  States his wife is supportive and knowledgeable  Demonstrated degree of understanding via:  Teach Back   Monitoring/Evaluation:  Dietary intake, exercise, reading food labels, SMBG, and body weight PRN

## 2014-12-20 NOTE — Patient Instructions (Addendum)
Plan:  Continue to aim for 3 Carb Choices per meal (45 grams) +/- 1 either way  Continue to aim for 0-2 Carbs per snack if hungry  Continue reading food labels for Total Carbohydrate of foods Continue with your activity level daily as tolerated, great job! Continue checking BG at alternate times per day as directed by MD  Continue taking yourLevemir to 30 units at night per MD  Continue taking the Novolog only before meals if BG is above 100 mg/dl

## 2015-01-07 ENCOUNTER — Other Ambulatory Visit: Payer: Self-pay | Admitting: Family Medicine

## 2015-01-08 ENCOUNTER — Other Ambulatory Visit: Payer: Self-pay | Admitting: Family Medicine

## 2015-01-08 NOTE — Telephone Encounter (Signed)
Medication filled x1 with no refills.   Requires office visit before any further refills can be given.   Letter sent.  

## 2015-01-10 ENCOUNTER — Encounter: Payer: Self-pay | Admitting: Family Medicine

## 2015-01-10 ENCOUNTER — Ambulatory Visit (INDEPENDENT_AMBULATORY_CARE_PROVIDER_SITE_OTHER): Payer: Medicare Other | Admitting: Family Medicine

## 2015-01-10 VITALS — BP 122/80 | HR 68 | Temp 98.6°F | Resp 14 | Ht 73.0 in | Wt 266.0 lb

## 2015-01-10 DIAGNOSIS — E119 Type 2 diabetes mellitus without complications: Principal | ICD-10-CM

## 2015-01-10 DIAGNOSIS — E109 Type 1 diabetes mellitus without complications: Secondary | ICD-10-CM | POA: Diagnosis not present

## 2015-01-10 DIAGNOSIS — IMO0001 Reserved for inherently not codable concepts without codable children: Secondary | ICD-10-CM

## 2015-01-10 DIAGNOSIS — Z794 Long term (current) use of insulin: Principal | ICD-10-CM

## 2015-01-10 LAB — LIPID PANEL
CHOL/HDL RATIO: 3.2 ratio (ref <=5.0)
Cholesterol: 136 mg/dL (ref 125–200)
HDL: 42 mg/dL (ref >=40)
LDL CALC: 64 mg/dL (ref <130)
TRIGLYCERIDES: 148 mg/dL (ref <150)
VLDL: 30 mg/dL (ref <30)

## 2015-01-10 LAB — CBC WITH DIFFERENTIAL/PLATELET
BASOS PCT: 1 % (ref 0–1)
Basophils Absolute: 0.1 10*3/uL (ref 0.0–0.1)
EOS ABS: 0.1 10*3/uL (ref 0.0–0.7)
Eosinophils Relative: 2 % (ref 0–5)
HCT: 36.6 % — ABNORMAL LOW (ref 39.0–52.0)
Hemoglobin: 12.1 g/dL — ABNORMAL LOW (ref 13.0–17.0)
Lymphocytes Relative: 49 % — ABNORMAL HIGH (ref 12–46)
Lymphs Abs: 3.3 10*3/uL (ref 0.7–4.0)
MCH: 27.6 pg (ref 26.0–34.0)
MCHC: 33.1 g/dL (ref 30.0–36.0)
MCV: 83.6 fL (ref 78.0–100.0)
MONOS PCT: 7 % (ref 3–12)
MPV: 9.6 fL (ref 8.6–12.4)
Monocytes Absolute: 0.5 10*3/uL (ref 0.1–1.0)
NEUTROS PCT: 41 % — AB (ref 43–77)
Neutro Abs: 2.7 10*3/uL (ref 1.7–7.7)
PLATELETS: 311 10*3/uL (ref 150–400)
RBC: 4.38 MIL/uL (ref 4.22–5.81)
RDW: 15.5 % (ref 11.5–15.5)
WBC: 6.7 10*3/uL (ref 4.0–10.5)

## 2015-01-10 LAB — COMPLETE METABOLIC PANEL WITH GFR
ALBUMIN: 4.1 g/dL (ref 3.6–5.1)
ALK PHOS: 57 U/L (ref 40–115)
ALT: 10 U/L (ref 9–46)
AST: 15 U/L (ref 10–35)
BILIRUBIN TOTAL: 0.3 mg/dL (ref 0.2–1.2)
BUN: 19 mg/dL (ref 7–25)
CALCIUM: 9.5 mg/dL (ref 8.6–10.3)
CO2: 24 mmol/L (ref 20–31)
Chloride: 103 mmol/L (ref 98–110)
Creat: 1.4 mg/dL — ABNORMAL HIGH (ref 0.70–1.25)
GFR, EST AFRICAN AMERICAN: 61 mL/min (ref >=60)
GFR, EST NON AFRICAN AMERICAN: 53 mL/min — AB (ref >=60)
GLUCOSE: 92 mg/dL (ref 70–99)
Potassium: 4.1 mmol/L (ref 3.5–5.3)
Sodium: 137 mmol/L (ref 135–146)
TOTAL PROTEIN: 7.8 g/dL (ref 6.1–8.1)

## 2015-01-10 LAB — HEMOGLOBIN A1C
HEMOGLOBIN A1C: 6.5 % — AB (ref <5.7)
Mean Plasma Glucose: 140 mg/dL — ABNORMAL HIGH (ref <117)

## 2015-01-10 NOTE — Progress Notes (Signed)
Subjective:    Patient ID: Curtis Hunt, male    DOB: [birth year], 64 y.o.   MRN: [id number]  HPI Patient is a very pleasant 64 year old African-American male here today for follow-up of his diabetes. He sees an ophthalmologist every year. His diabetic eye exam is up-to-date. Diabetic foot exam is performed today and is normal. Patient is checking his blood sugar. He is currently on Levemir 30 units daily at bedtime and Invokana 300 m gpoqday.  He has stopped using regular insulin. Since making these changes, he has lost 12 pounds. He states his blood sugars are typically between 101 150. He denies any hypoglycemic episodes. He denies any chest pain shortness of breath or dyspnea on exertion. Colonoscopy was performed last year and was normal. Prostate exam is performed every year by his urologist Past Medical History  Diagnosis Date  . Hyperlipidemia   . Gout   . Cancer 2004    Prostate CA with surgery  . Diabetes mellitus without complication    Past Surgical History  Procedure Laterality Date  . Prostate surgery     Current Outpatient Prescriptions on File Prior to Visit  Medication Sig Dispense Refill  . aspirin 81 MG tablet Take 81 mg by mouth daily.    . Blood Glucose Monitoring Suppl (ONE TOUCH ULTRA 2) W/DEVICE KIT As driected 1 each 0  . CRESTOR 40 MG tablet TAKE 1 TABLET BY MOUTH EVERY DAY 90 tablet 0  . esomeprazole (NEXIUM) 40 MG capsule TAKE ONE CAPSULE BY MOUTH EVERY DAY 30 capsule 11  . glucose blood (ONE TOUCH ULTRA TEST) test strip CHECK SUGAR 4-5 TIMES A DAY.EVERY MORNING AFTER MEALS AND AT BEDTIME 200 each 8  . insulin aspart (NOVOLOG) 100 UNIT/ML injection Inject 16 Units into the skin 3 (three) times daily with meals. 10 mL 11  . Insulin Detemir (LEVEMIR FLEXTOUCH) 100 UNIT/ML Pen INJECT 65 UNITS EVERY AM WITH BREAKFAST... + INJECT 60 UNITS INTO THE SKIN DAILY AT 10 PM. (Patient taking differently: Inject 30 Units into the skin daily at 10 pm. ) 30 pen 5  . Insulin  Pen Needle (B-D UF III MINI PEN NEEDLES) 31G X 5 MM MISC USE WITH LEVEMIR FLEXTOUCH. Injects BID - Dx- 250.00 100 each 11  . Insulin Pen Needle 31G X 6 MM MISC Use with Levemir Flextouch Pen 100 each 0  . INVOKANA 300 MG TABS tablet TAKE 300 MG BY MOUTH DAILY BEFORE BREAKFAST. 30 tablet 0  . NOVOLOG FLEXPEN 100 UNIT/ML FlexPen INJECT 16 UNITS INTO THE SKIN 3 (THREE) TIMES DAILY WITH MEALS. (Patient taking differently: INJECT 8 UNITS INTO THE SKIN 3 (THREE) TIMES DAILY WITH MEALS.as needed) 15 pen 4  . omeprazole (PRILOSEC) 20 MG capsule TAKE 1 CAPSULE BY MOUTH ONCE A DAY 30 capsule 11  . sildenafil (VIAGRA) 100 MG tablet Take 0.5-1 tablets (50-100 mg total) by mouth daily as needed for erectile dysfunction. 5 tablet 11  . ULORIC 80 MG TABS TAKE 1 TABLET BY MOUTH ONCE A DAY 30 tablet 4  . valsartan (DIOVAN) 160 MG tablet Take 1 tablet (160 mg total) by mouth daily. 30 tablet 11   No current facility-administered medications on file prior to visit.   No Known Allergies Social History   Social History  . Marital Status: Married    Spouse Name: N/A  . Number of Children: N/A  . Years of Education: N/A   Occupational History  . Not on file.   Social History Main Topics  .  Smoking status: Never Smoker   . Smokeless tobacco: Never Used  . Alcohol Use: Yes     Comment: Beer prior to diagnosis of diabetes, about once a week during sports on tv  . Drug Use: No  . Sexual Activity: Not on file   Other Topics Concern  . Not on file   Social History Narrative      Review of Systems  All other systems reviewed and are negative.      Objective:   Physical Exam  Constitutional: He appears well-developed and well-nourished.  Neck: Neck supple. No JVD present. No thyromegaly present.  Cardiovascular: Normal rate, regular rhythm, normal heart sounds and intact distal pulses.   No murmur heard. Pulmonary/Chest: Effort normal and breath sounds normal. No respiratory distress. He has no  wheezes. He has no rales.  Abdominal: Soft. Bowel sounds are normal. He exhibits no distension. There is no tenderness. There is no rebound and no guarding.  Musculoskeletal: He exhibits no edema.  Lymphadenopathy:    He has no cervical adenopathy.  Vitals reviewed.         Assessment & Plan:  Diabetes mellitus, insulin dependent (IDDM), controlled - Plan: Microalbumin, urine, Lipid panel, Hemoglobin A1c, COMPLETE METABOLIC PANEL WITH GFR, CBC with Differential/Platelet  Patient's blood pressure is well controlled. I'll make no changes in his blood pressure medication. I will check a hemoglobin A1c. Hemoglobin A1c is less than 7. Check a fasting lipid panel. Goal LDL cholesterol is less than 100. Diabetic eye exam is up-to-date. Diabetic foot exam is up-to-date. Patient is compliant in taking an aspirin.

## 2015-01-11 ENCOUNTER — Encounter: Payer: Self-pay | Admitting: Family Medicine

## 2015-01-11 LAB — MICROALBUMIN, URINE: Microalb, Ur: 1.7 mg/dL (ref <2.0)

## 2015-01-20 ENCOUNTER — Other Ambulatory Visit: Payer: Self-pay | Admitting: Family Medicine

## 2015-01-30 ENCOUNTER — Encounter: Payer: Self-pay | Admitting: Family Medicine

## 2015-02-07 ENCOUNTER — Telehealth: Payer: Self-pay | Admitting: *Deleted

## 2015-02-07 MED ORDER — ALLOPURINOL 300 MG PO TABS: 300.0000 mg | ORAL_TABLET | Freq: Every day | ORAL | Status: DC

## 2015-02-07 NOTE — Telephone Encounter (Signed)
Received fax requesting PA on Uloric.   Per chart notes,Uloric is to be d/c'ed and Allopurinol 300mg  PO QD is to be started.   Prescription sent to pharmacy.

## 2015-02-08 ENCOUNTER — Other Ambulatory Visit: Payer: Self-pay | Admitting: Family Medicine

## 2015-02-08 NOTE — Telephone Encounter (Signed)
Medication refilled per protocol. 

## 2015-02-19 ENCOUNTER — Encounter: Payer: Self-pay | Admitting: Family Medicine

## 2015-03-11 ENCOUNTER — Other Ambulatory Visit: Payer: Self-pay | Admitting: Family Medicine

## 2015-03-11 MED ORDER — VALSARTAN 160 MG PO TABS: 160.0000 mg | ORAL_TABLET | Freq: Every day | ORAL | Status: DC

## 2015-03-11 MED ORDER — ALLOPURINOL 300 MG PO TABS: 300.0000 mg | ORAL_TABLET | Freq: Every day | ORAL | Status: DC

## 2015-03-13 ENCOUNTER — Other Ambulatory Visit: Payer: Self-pay | Admitting: Family Medicine

## 2015-04-03 DIAGNOSIS — C61 Malignant neoplasm of prostate: Secondary | ICD-10-CM | POA: Diagnosis not present

## 2015-04-10 DIAGNOSIS — N390 Urinary tract infection, site not specified: Secondary | ICD-10-CM | POA: Diagnosis not present

## 2015-04-10 DIAGNOSIS — N5201 Erectile dysfunction due to arterial insufficiency: Secondary | ICD-10-CM | POA: Diagnosis not present

## 2015-04-10 DIAGNOSIS — C61 Malignant neoplasm of prostate: Secondary | ICD-10-CM | POA: Diagnosis not present

## 2015-04-26 ENCOUNTER — Encounter: Payer: Self-pay | Admitting: Radiation Oncology

## 2015-04-26 NOTE — Progress Notes (Signed)
Opened in error

## 2015-04-26 NOTE — Progress Notes (Signed)
GU Location of Tumor / Histology: prostatic adenocarcinoma  If Prostate Cancer, Gleason Score is (3 + 4) and PSA is (.20) on 04/03/2015.  [name] underwent radical prostatectomy in February 2005. His PSA has been undetectable. In September 2013 his PSA is 0.07 and mostly recently 0.20.    Past/Anticipated interventions by urology, if any: prostatectomy , routine PSA checks, referral to Dr. Tammi Klippel  Past/Anticipated interventions by medical oncology, if any: no  Weight changes, if any: no  Bowel/Bladder complaints, if any: very seldom minimal stress urinary incontinence, good stream and feels like he empties well, reports ED, denies dysuria, denies hematuria  Nausea/Vomiting, if any: no  Pain issues, if any:  no  SAFETY ISSUES:  Prior radiation? no  Pacemaker/ICD? no  Possible current pregnancy? no  Is the patient on methotrexate? no  Current Complaints / other details:  64 year old male. Married with one son and one daughter. Retired.  Brother - prostate ca - had prostatectomy three years ago Mother - breast ca

## 2015-04-29 ENCOUNTER — Ambulatory Visit
Admission: RE | Admit: 2015-04-29 | Discharge: 2015-04-29 | Disposition: A | Discharge status: Home / Self Care | Payer: Medicare Other | Source: Ambulatory Visit | Attending: Radiation Oncology | Admitting: Radiation Oncology

## 2015-04-29 ENCOUNTER — Encounter: Payer: Self-pay | Admitting: Radiation Oncology

## 2015-04-29 VITALS — BP 132/80 | HR 86 | Resp 16 | Ht 73.0 in | Wt 274.4 lb

## 2015-04-29 DIAGNOSIS — Z7982 Long term (current) use of aspirin: Secondary | ICD-10-CM | POA: Diagnosis not present

## 2015-04-29 DIAGNOSIS — F1010 Alcohol abuse, uncomplicated: Secondary | ICD-10-CM | POA: Diagnosis not present

## 2015-04-29 DIAGNOSIS — C61 Malignant neoplasm of prostate: Secondary | ICD-10-CM | POA: Insufficient documentation

## 2015-04-29 DIAGNOSIS — R9721 Rising PSA following treatment for malignant neoplasm of prostate: Secondary | ICD-10-CM | POA: Insufficient documentation

## 2015-04-29 DIAGNOSIS — Z510 Encounter for antineoplastic radiation therapy: Secondary | ICD-10-CM | POA: Insufficient documentation

## 2015-04-29 DIAGNOSIS — M109 Gout, unspecified: Secondary | ICD-10-CM | POA: Insufficient documentation

## 2015-04-29 DIAGNOSIS — E785 Hyperlipidemia, unspecified: Secondary | ICD-10-CM | POA: Diagnosis not present

## 2015-04-29 DIAGNOSIS — E119 Type 2 diabetes mellitus without complications: Secondary | ICD-10-CM | POA: Diagnosis not present

## 2015-04-29 DIAGNOSIS — Z9889 Other specified postprocedural states: Secondary | ICD-10-CM | POA: Diagnosis not present

## 2015-04-29 NOTE — Progress Notes (Signed)
See progress note under physician encounter. 

## 2015-04-29 NOTE — Progress Notes (Signed)
Radiation Oncology         [phone] ________________________________  Initial outpatient Consultation  Name: Curtis Hunt MRN: [id number]  Date: 04/29/2015  DOB: [birth year]  QP:RFFMBWG,YKZLDJ TOM, MD  Kathie Rhodes, MD   REFERRING PHYSICIAN: Kathie Rhodes, MD  DIAGNOSIS: 64 y.o. gentleman with a rising detectable PSA of 0.20 s/p prostatectomy (06/26/03) for stage T2c adenocarcinoma of the prostate with a Gleason's score of 3+4.   No diagnosis found.  HISTORY OF PRESENT ILLNESS::Curtis Hunt is a 64 y.o. gentleman with an initial diagnosis of prostate cancer in December 2004    He underwent radical prostatectomy (Dr.Humphries) in February 2005. Margins, ECE, and SV's were negative.  LN's were negative.  Post-up, the patient's PSA level has been undetectable through May 31st, 2011. However, in September 2013 his PSA was detectable at 0.07. As of 04/03/15, the patient's PSA has elevated to 0.20.   Patient has very seldom minimal stress urinary incontinence, good stream and feels like he empties well, reports ED, denies dysuria, denies hematuria  PREVIOUS RADIATION THERAPY: No  PAST MEDICAL HISTORY:  has a past medical history of Hyperlipidemia; Gout; Cancer [facility]) (2004); Diabetes mellitus without complication ([place]); and Prostate cancer ([place]).    PAST SURGICAL HISTORY: Past Surgical History  Procedure Laterality Date  . Prostate surgery      FAMILY HISTORY: family history includes Cancer in his brother and mother.  SOCIAL HISTORY:  reports that he has never smoked. He has never used smokeless tobacco. He reports that he drinks alcohol. He reports that he does not use illicit drugs.  ALLERGIES: Review of patient's allergies indicates no known allergies.  MEDICATIONS:  Current Outpatient Prescriptions  Medication Sig Dispense Refill  . allopurinol (ZYLOPRIM) 300 MG tablet Take 1 tablet (300 mg total) by mouth daily. 90 tablet 3  . aspirin 81 MG tablet Take 81 mg by  mouth daily.    . B-D UF III MINI PEN NEEDLES 31G X 5 MM MISC USE TWICE A DAY....250.00 100 each 11  . Blood Glucose Monitoring Suppl (ONE TOUCH ULTRA 2) W/DEVICE KIT As driected 1 each 0  . esomeprazole (NEXIUM) 40 MG capsule TAKE ONE CAPSULE BY MOUTH EVERY DAY 30 capsule 11  . glucose blood (ONE TOUCH ULTRA TEST) test strip CHECK SUGAR 4-5 TIMES A DAY.EVERY MORNING AFTER MEALS AND AT BEDTIME 200 each 8  . insulin aspart (NOVOLOG) 100 UNIT/ML injection Inject 16 Units into the skin 3 (three) times daily with meals. 10 mL 11  . Insulin Detemir (LEVEMIR FLEXTOUCH) 100 UNIT/ML Pen INJECT 65 UNITS EVERY AM WITH BREAKFAST... + INJECT 60 UNITS INTO THE SKIN DAILY AT 10 PM. (Patient taking differently: Inject 30 Units into the skin daily at 10 pm. ) 30 pen 5  . Insulin Pen Needle 31G X 6 MM MISC Use with Levemir Flextouch Pen 100 each 0  . INVOKANA 300 MG TABS tablet TAKE 1 TABLET BY MOUTH EVERY DAY BEFORE BREAKFAST 30 tablet 5  . NOVOLOG FLEXPEN 100 UNIT/ML FlexPen INJECT 16 UNITS INTO THE SKIN 3 (THREE) TIMES DAILY WITH MEALS. (Patient taking differently: INJECT 8 UNITS INTO THE SKIN 3 (THREE) TIMES DAILY WITH MEALS.as needed) 15 pen 4  . rosuvastatin (CRESTOR) 40 MG tablet TAKE 1 TABLET BY MOUTH EVERY DAY 90 tablet 3  . sildenafil (VIAGRA) 100 MG tablet Take 0.5-1 tablets (50-100 mg total) by mouth daily as needed for erectile dysfunction. 5 tablet 11  . ULORIC 80 MG TABS Take 1 tablet by mouth  daily.  4  . valsartan (DIOVAN) 160 MG tablet Take 1 tablet (160 mg total) by mouth daily. 90 tablet 3  . omeprazole (PRILOSEC) 20 MG capsule TAKE 1 CAPSULE BY MOUTH ONCE A DAY (Patient not taking: Reported on 04/29/2015) 30 capsule 11   No current facility-administered medications for this encounter.    REVIEW OF SYSTEMS:  A 15 point review of systems is documented in the electronic medical record. This was obtained by the nursing staff. However, I reviewed this with the patient to discuss relevant findings  and make appropriate changes.  Pertinent items are noted in HPI..  The patient completed an IPSS and IIEF questionnaire.  His IPSS score was 3 indicating mild urinary outflow obstructive symptoms.  He indicated that his erectile function is unable to complete sexual activity.   PHYSICAL EXAM: This patient is in no acute distress.  He is alert and oriented.   height is _0  (1.854 m) and weight is 274 lb 6.4 oz (124.467 kg). His blood pressure is 132/80 and his pulse is 86. His respiration is 16 and oxygen saturation is 100%.  He exhibits no respiratory distress or labored breathing.  He appears neurologically intact.  His mood is pleasant.  His affect is appropriate.  Please note the digital rectal exam findings described above.  KPS = 100  100 - Normal; no complaints; no evidence of disease. 90   - Able to carry on normal activity; minor signs or symptoms of disease. 80   - Normal activity with effort; some signs or symptoms of disease. 67   - Cares for self; unable to carry on normal activity or to do active work. 60   - Requires occasional assistance, but is able to care for most of his personal needs. 50   - Requires considerable assistance and frequent medical care. 64   - Disabled; requires special care and assistance. 22   - Severely disabled; hospital admission is indicated although death not imminent. 40   - Very sick; hospital admission necessary; active supportive treatment necessary. 10   - Moribund; fatal processes progressing rapidly. 0     - Dead  Karnofsky DA, Abelmann Sehili, Craver LS and [facility] [phone]) The use of the nitrogen mustards in the palliative treatment of carcinoma: with particular reference to bronchogenic carcinoma Cancer 1 [phone]   LABORATORY DATA:  Lab Results  Component Value Date   WBC 6.7 01/10/2015   HGB 12.1* 01/10/2015   HCT 36.6* 01/10/2015   MCV 83.6 01/10/2015   PLT 311 01/10/2015   Lab Results  Component Value Date   NA 137 01/10/2015   K  4.1 01/10/2015   CL 103 01/10/2015   CO2 24 01/10/2015   Lab Results  Component Value Date   ALT 10 01/10/2015   AST 15 01/10/2015   ALKPHOS 57 01/10/2015   BILITOT 0.3 01/10/2015     RADIOGRAPHY: No results found.     IMPRESSION: This gentleman is a 64 year old with a rising detectable PSA of 0.20 s/p prostatectomy (06/26/03) for stage T2c adenocarcinoma of the prostate with a Gleason's score of 3+4. The patient's PSA has been steadily rising  and therefore would likely benefit from salvage prostatic fossa radiotherapy.  His long disease free interval and slow doubling time are consistent with locally recurrent indolent disease.  PLAN: Today I reviewed the findings and workup thus far.  We discussed the natural history of prostate cancer.  We reviewed the the implications of  T-stage, Gleason's Score, and PSA on decision-making and outcomes in prostate cancer.  We discussed radiation treatment in the management of prostate cancer with regard to the logistics and delivery of external beam radiation treatment. The patient expressed interest in external beam radiotherapy.  The patient would like to proceed with prostatic fossa IMRT.  I will share my findings with Dr. Karsten Ro and move forward with planning.  Simulation scheduled for 05/31/15 at 11 am.  I enjoyed meeting with him today, and will look forward to participating in the care of this very nice gentleman.  I spent 55 minutes face to face with the patient and more than 50% of that time was spent in counseling and/or coordination of care.   ------------------------------------------------  Sheral Apley. Tammi Klippel, M.D.  This document serves as a record of services personally performed by Tyler Pita, MD. It was created on his behalf by Derek Mound, a trained medical scribe. The creation of this record is based on the scribe's personal observations and the provider's statements to them. This document has been checked and approved by the  attending provider.

## 2015-05-01 NOTE — Addendum Note (Signed)
Encounter addended by: Heywood Footman, RN on: 05/01/2015  4:52 PM<BR>     Documentation filed: Charges VN

## 2015-05-17 ENCOUNTER — Other Ambulatory Visit: Payer: Self-pay | Admitting: Family Medicine

## 2015-05-31 ENCOUNTER — Ambulatory Visit
Admission: RE | Admit: 2015-05-31 | Discharge: 2015-05-31 | Disposition: A | Discharge status: Home / Self Care | Payer: Medicare Other | Source: Ambulatory Visit | Attending: Radiation Oncology | Admitting: Radiation Oncology

## 2015-05-31 DIAGNOSIS — Z9889 Other specified postprocedural states: Secondary | ICD-10-CM | POA: Diagnosis not present

## 2015-05-31 DIAGNOSIS — E119 Type 2 diabetes mellitus without complications: Secondary | ICD-10-CM | POA: Diagnosis not present

## 2015-05-31 DIAGNOSIS — Z7982 Long term (current) use of aspirin: Secondary | ICD-10-CM | POA: Diagnosis not present

## 2015-05-31 DIAGNOSIS — C61 Malignant neoplasm of prostate: Secondary | ICD-10-CM | POA: Diagnosis not present

## 2015-05-31 DIAGNOSIS — M109 Gout, unspecified: Secondary | ICD-10-CM | POA: Diagnosis not present

## 2015-05-31 DIAGNOSIS — Z510 Encounter for antineoplastic radiation therapy: Secondary | ICD-10-CM | POA: Diagnosis not present

## 2015-05-31 DIAGNOSIS — E785 Hyperlipidemia, unspecified: Secondary | ICD-10-CM | POA: Diagnosis not present

## 2015-05-31 DIAGNOSIS — R9721 Rising PSA following treatment for malignant neoplasm of prostate: Secondary | ICD-10-CM | POA: Diagnosis not present

## 2015-05-31 NOTE — Progress Notes (Signed)
  Radiation Oncology         [phone] ________________________________  Name: Curtis Hunt MRN: [id number]  Date: 05/31/2015  DOB: [birth year]  SIMULATION AND TREATMENT PLANNING NOTE    ICD-9-CM ICD-10-CM   1. Malignant neoplasm of prostate ([place]) 185 C61     DIAGNOSIS:  65 y.o. gentleman with a rising detectable PSA of 0.20 s/p prostatectomy (06/26/03) for stage T2c adenocarcinoma of the prostate with a Gleason's score of 3+4.   NARRATIVE:  The patient was brought to the [facility].  Identity was confirmed.  All relevant records and images related to the planned course of therapy were reviewed.  The patient freely provided informed written consent to proceed with treatment after reviewing the details related to the planned course of therapy. The consent form was witnessed and verified by the simulation staff.  Then, the patient was set-up in a stable reproducible supine position for radiation therapy.  A vacuum lock pillow device was custom fabricated to position his legs in a reproducible immobilized position.  Then, I performed a urethrogram under sterile conditions to identify the prostatic apex.  CT images were obtained.  Surface markings were placed.  The CT images were loaded into the planning software.  Then the prostate target and avoidance structures including the rectum, bladder, bowel and hips were contoured.  Treatment planning then occurred.  The radiation prescription was entered and confirmed.  A total of one complex treatment devices were fabricated. I have requested : Intensity Modulated Radiotherapy (IMRT) is medically necessary for this case for the following reason:  Rectal sparing.Marland Kitchen  PLAN:  The patient will receive 68.4 Gy in 38 fractions.  ________________________________  Sheral Apley Tammi Klippel, M.D.  This document serves as a record of services personally performed by Tyler Pita, MD. It was created on his behalf by Arlyce Harman, a trained  medical scribe. The creation of this record is based on the scribe's personal observations and the provider's statements to them. This document has been checked and approved by the attending provider.

## 2015-06-06 DIAGNOSIS — R9721 Rising PSA following treatment for malignant neoplasm of prostate: Secondary | ICD-10-CM | POA: Diagnosis not present

## 2015-06-06 DIAGNOSIS — E785 Hyperlipidemia, unspecified: Secondary | ICD-10-CM | POA: Diagnosis not present

## 2015-06-06 DIAGNOSIS — Z7982 Long term (current) use of aspirin: Secondary | ICD-10-CM | POA: Diagnosis not present

## 2015-06-06 DIAGNOSIS — C61 Malignant neoplasm of prostate: Secondary | ICD-10-CM | POA: Diagnosis not present

## 2015-06-06 DIAGNOSIS — Z510 Encounter for antineoplastic radiation therapy: Secondary | ICD-10-CM | POA: Diagnosis not present

## 2015-06-06 DIAGNOSIS — M109 Gout, unspecified: Secondary | ICD-10-CM | POA: Diagnosis not present

## 2015-06-06 DIAGNOSIS — E119 Type 2 diabetes mellitus without complications: Secondary | ICD-10-CM | POA: Diagnosis not present

## 2015-06-06 DIAGNOSIS — Z9889 Other specified postprocedural states: Secondary | ICD-10-CM | POA: Diagnosis not present

## 2015-06-07 DIAGNOSIS — M109 Gout, unspecified: Secondary | ICD-10-CM | POA: Diagnosis not present

## 2015-06-07 DIAGNOSIS — R9721 Rising PSA following treatment for malignant neoplasm of prostate: Secondary | ICD-10-CM | POA: Diagnosis not present

## 2015-06-07 DIAGNOSIS — E119 Type 2 diabetes mellitus without complications: Secondary | ICD-10-CM | POA: Diagnosis not present

## 2015-06-07 DIAGNOSIS — Z7982 Long term (current) use of aspirin: Secondary | ICD-10-CM | POA: Diagnosis not present

## 2015-06-07 DIAGNOSIS — E785 Hyperlipidemia, unspecified: Secondary | ICD-10-CM | POA: Diagnosis not present

## 2015-06-07 DIAGNOSIS — Z9889 Other specified postprocedural states: Secondary | ICD-10-CM | POA: Diagnosis not present

## 2015-06-07 DIAGNOSIS — C61 Malignant neoplasm of prostate: Secondary | ICD-10-CM | POA: Diagnosis not present

## 2015-06-07 DIAGNOSIS — Z510 Encounter for antineoplastic radiation therapy: Secondary | ICD-10-CM | POA: Diagnosis not present

## 2015-06-11 ENCOUNTER — Ambulatory Visit: Payer: Medicare Other | Admitting: Radiation Oncology

## 2015-06-11 ENCOUNTER — Ambulatory Visit
Admission: RE | Admit: 2015-06-11 | Discharge: 2015-06-11 | Disposition: A | Discharge status: Home / Self Care | Payer: Medicare Other | Source: Ambulatory Visit | Attending: Radiation Oncology | Admitting: Radiation Oncology

## 2015-06-11 DIAGNOSIS — Z510 Encounter for antineoplastic radiation therapy: Secondary | ICD-10-CM | POA: Diagnosis not present

## 2015-06-11 DIAGNOSIS — C61 Malignant neoplasm of prostate: Secondary | ICD-10-CM | POA: Diagnosis not present

## 2015-06-11 DIAGNOSIS — M109 Gout, unspecified: Secondary | ICD-10-CM | POA: Diagnosis not present

## 2015-06-11 DIAGNOSIS — E119 Type 2 diabetes mellitus without complications: Secondary | ICD-10-CM | POA: Diagnosis not present

## 2015-06-11 DIAGNOSIS — R9721 Rising PSA following treatment for malignant neoplasm of prostate: Secondary | ICD-10-CM | POA: Diagnosis not present

## 2015-06-11 DIAGNOSIS — E785 Hyperlipidemia, unspecified: Secondary | ICD-10-CM | POA: Diagnosis not present

## 2015-06-11 DIAGNOSIS — Z9889 Other specified postprocedural states: Secondary | ICD-10-CM | POA: Diagnosis not present

## 2015-06-11 DIAGNOSIS — Z7982 Long term (current) use of aspirin: Secondary | ICD-10-CM | POA: Diagnosis not present

## 2015-06-12 ENCOUNTER — Ambulatory Visit: Payer: Medicare Other

## 2015-06-12 ENCOUNTER — Ambulatory Visit
Admission: RE | Admit: 2015-06-12 | Discharge: 2015-06-12 | Disposition: A | Discharge status: Home / Self Care | Payer: Medicare Other | Source: Ambulatory Visit | Attending: Radiation Oncology | Admitting: Radiation Oncology

## 2015-06-12 DIAGNOSIS — C61 Malignant neoplasm of prostate: Secondary | ICD-10-CM | POA: Diagnosis not present

## 2015-06-12 DIAGNOSIS — Z510 Encounter for antineoplastic radiation therapy: Secondary | ICD-10-CM | POA: Diagnosis not present

## 2015-06-12 DIAGNOSIS — R9721 Rising PSA following treatment for malignant neoplasm of prostate: Secondary | ICD-10-CM | POA: Diagnosis not present

## 2015-06-12 DIAGNOSIS — E119 Type 2 diabetes mellitus without complications: Secondary | ICD-10-CM | POA: Diagnosis not present

## 2015-06-12 DIAGNOSIS — E785 Hyperlipidemia, unspecified: Secondary | ICD-10-CM | POA: Diagnosis not present

## 2015-06-12 DIAGNOSIS — M109 Gout, unspecified: Secondary | ICD-10-CM | POA: Diagnosis not present

## 2015-06-12 DIAGNOSIS — Z7982 Long term (current) use of aspirin: Secondary | ICD-10-CM | POA: Diagnosis not present

## 2015-06-12 DIAGNOSIS — Z9889 Other specified postprocedural states: Secondary | ICD-10-CM | POA: Diagnosis not present

## 2015-06-13 ENCOUNTER — Ambulatory Visit
Admission: RE | Admit: 2015-06-13 | Discharge: 2015-06-13 | Disposition: A | Discharge status: Home / Self Care | Payer: Medicare Other | Source: Ambulatory Visit | Attending: Radiation Oncology | Admitting: Radiation Oncology

## 2015-06-13 ENCOUNTER — Ambulatory Visit: Payer: Medicare Other

## 2015-06-13 VITALS — BP 129/81 | HR 84 | Resp 16 | Wt 273.4 lb

## 2015-06-13 DIAGNOSIS — Z7982 Long term (current) use of aspirin: Secondary | ICD-10-CM | POA: Diagnosis not present

## 2015-06-13 DIAGNOSIS — R9721 Rising PSA following treatment for malignant neoplasm of prostate: Secondary | ICD-10-CM | POA: Diagnosis not present

## 2015-06-13 DIAGNOSIS — C61 Malignant neoplasm of prostate: Secondary | ICD-10-CM

## 2015-06-13 DIAGNOSIS — Z9889 Other specified postprocedural states: Secondary | ICD-10-CM | POA: Diagnosis not present

## 2015-06-13 DIAGNOSIS — E785 Hyperlipidemia, unspecified: Secondary | ICD-10-CM | POA: Diagnosis not present

## 2015-06-13 DIAGNOSIS — E119 Type 2 diabetes mellitus without complications: Secondary | ICD-10-CM | POA: Diagnosis not present

## 2015-06-13 DIAGNOSIS — M109 Gout, unspecified: Secondary | ICD-10-CM | POA: Diagnosis not present

## 2015-06-13 DIAGNOSIS — Z510 Encounter for antineoplastic radiation therapy: Secondary | ICD-10-CM | POA: Diagnosis not present

## 2015-06-13 NOTE — Progress Notes (Addendum)
Weight and vitals stable. Denies pain. Reports nocturia x 1. Denies dysuria or hematuria. Describes a strong steady urine stream without difficulty emptying. Reports rare occasion of urinary incontinence. Denies diarrhea. Denies fatigue. Oriented patient to staff and routine of the clinic. Provided patient with RADIATION THERAPY AND YOU handbook then, reviewed pertinent information. Educated patient reference potential side effects and management such as fatigue, diarrhea, and urinary bladder changes. Answered all patient questions to the best of my ability. Provided patient with my business card and encouraged him to call with needs. Patient verbalized understanding of all reviewed.    BP 129/81 mmHg  Pulse 84  Resp 16  Wt 273 lb 6.4 oz (124.013 kg)  SpO2 100% Wt Readings from Last 3 Encounters:  06/13/15 273 lb 6.4 oz (124.013 kg)  04/29/15 274 lb 6.4 oz (124.467 kg)  01/10/15 266 lb (120.657 kg)

## 2015-06-13 NOTE — Progress Notes (Signed)
  Radiation Oncology         [phone]   Name: Curtis Hunt MRN: [id number]   Date: 06/13/2015  DOB: 1951/04/06   Weekly Radiation Therapy Management    ICD-9-CM ICD-10-CM   1. Malignant neoplasm of prostate ([facility]) 185 C61     Current Dose: 5.4 Gy  Planned Dose:  68.4 Gy  Narrative The patient presents for routine under treatment assessment.  Weight and vitals stable. Denies pain. Reports nocturia x 1. Denies dysuria or hematuria. Describes a strong steady urine stream without difficulty emptying. Reports rare occasion of urinary incontinence. Denies diarrhea. Denies fatigue.  .   The patient is without complaint. Set-up films were reviewed. The chart was checked.  Physical Findings  weight is 273 lb 6.4 oz (124.013 kg). His blood pressure is 129/81 and his pulse is 84. His respiration is 16 and oxygen saturation is 100%. . Weight essentially stable.  No significant changes.  Impression The patient is tolerating radiation.  Plan Continue treatment as planned.         Sheral Apley Tammi Klippel, M.D.  This document serves as a record of services personally performed by Tyler Pita, MD. It was created on his behalf by Jenell Milliner, a trained medical scribe. The creation of this record is based on the scribe's personal observations and the provider's statements to them. This document has been checked and approved by the attending provider.

## 2015-06-14 ENCOUNTER — Ambulatory Visit: Payer: Medicare Other

## 2015-06-14 ENCOUNTER — Ambulatory Visit
Admission: RE | Admit: 2015-06-14 | Discharge: 2015-06-14 | Disposition: A | Discharge status: Home / Self Care | Payer: Medicare Other | Source: Ambulatory Visit | Attending: Radiation Oncology | Admitting: Radiation Oncology

## 2015-06-14 DIAGNOSIS — Z7982 Long term (current) use of aspirin: Secondary | ICD-10-CM | POA: Diagnosis not present

## 2015-06-14 DIAGNOSIS — Z9889 Other specified postprocedural states: Secondary | ICD-10-CM | POA: Diagnosis not present

## 2015-06-14 DIAGNOSIS — R9721 Rising PSA following treatment for malignant neoplasm of prostate: Secondary | ICD-10-CM | POA: Diagnosis not present

## 2015-06-14 DIAGNOSIS — C61 Malignant neoplasm of prostate: Secondary | ICD-10-CM | POA: Diagnosis not present

## 2015-06-14 DIAGNOSIS — E785 Hyperlipidemia, unspecified: Secondary | ICD-10-CM | POA: Diagnosis not present

## 2015-06-14 DIAGNOSIS — M109 Gout, unspecified: Secondary | ICD-10-CM | POA: Diagnosis not present

## 2015-06-14 DIAGNOSIS — Z510 Encounter for antineoplastic radiation therapy: Secondary | ICD-10-CM | POA: Diagnosis not present

## 2015-06-14 DIAGNOSIS — E119 Type 2 diabetes mellitus without complications: Secondary | ICD-10-CM | POA: Diagnosis not present

## 2015-06-17 ENCOUNTER — Ambulatory Visit
Admission: RE | Admit: 2015-06-17 | Discharge: 2015-06-17 | Disposition: A | Discharge status: Home / Self Care | Payer: Medicare Other | Source: Ambulatory Visit | Attending: Radiation Oncology | Admitting: Radiation Oncology

## 2015-06-17 ENCOUNTER — Ambulatory Visit: Payer: Medicare Other

## 2015-06-17 DIAGNOSIS — Z9889 Other specified postprocedural states: Secondary | ICD-10-CM | POA: Diagnosis not present

## 2015-06-17 DIAGNOSIS — R9721 Rising PSA following treatment for malignant neoplasm of prostate: Secondary | ICD-10-CM | POA: Diagnosis not present

## 2015-06-17 DIAGNOSIS — M109 Gout, unspecified: Secondary | ICD-10-CM | POA: Diagnosis not present

## 2015-06-17 DIAGNOSIS — C61 Malignant neoplasm of prostate: Secondary | ICD-10-CM | POA: Diagnosis not present

## 2015-06-17 DIAGNOSIS — Z510 Encounter for antineoplastic radiation therapy: Secondary | ICD-10-CM | POA: Diagnosis not present

## 2015-06-17 DIAGNOSIS — Z7982 Long term (current) use of aspirin: Secondary | ICD-10-CM | POA: Diagnosis not present

## 2015-06-17 DIAGNOSIS — E785 Hyperlipidemia, unspecified: Secondary | ICD-10-CM | POA: Diagnosis not present

## 2015-06-17 DIAGNOSIS — E119 Type 2 diabetes mellitus without complications: Secondary | ICD-10-CM | POA: Diagnosis not present

## 2015-06-18 ENCOUNTER — Ambulatory Visit
Admission: RE | Admit: 2015-06-18 | Discharge: 2015-06-18 | Disposition: A | Discharge status: Home / Self Care | Payer: Medicare Other | Source: Ambulatory Visit | Attending: Radiation Oncology | Admitting: Radiation Oncology

## 2015-06-18 ENCOUNTER — Ambulatory Visit: Payer: Medicare Other

## 2015-06-18 DIAGNOSIS — Z510 Encounter for antineoplastic radiation therapy: Secondary | ICD-10-CM | POA: Diagnosis not present

## 2015-06-18 DIAGNOSIS — E785 Hyperlipidemia, unspecified: Secondary | ICD-10-CM | POA: Diagnosis not present

## 2015-06-18 DIAGNOSIS — M109 Gout, unspecified: Secondary | ICD-10-CM | POA: Diagnosis not present

## 2015-06-18 DIAGNOSIS — Z7982 Long term (current) use of aspirin: Secondary | ICD-10-CM | POA: Diagnosis not present

## 2015-06-18 DIAGNOSIS — R9721 Rising PSA following treatment for malignant neoplasm of prostate: Secondary | ICD-10-CM | POA: Diagnosis not present

## 2015-06-18 DIAGNOSIS — C61 Malignant neoplasm of prostate: Secondary | ICD-10-CM | POA: Diagnosis not present

## 2015-06-18 DIAGNOSIS — E119 Type 2 diabetes mellitus without complications: Secondary | ICD-10-CM | POA: Diagnosis not present

## 2015-06-18 DIAGNOSIS — Z9889 Other specified postprocedural states: Secondary | ICD-10-CM | POA: Diagnosis not present

## 2015-06-19 ENCOUNTER — Ambulatory Visit
Admission: RE | Admit: 2015-06-19 | Discharge: 2015-06-19 | Disposition: A | Discharge status: Home / Self Care | Payer: Medicare Other | Source: Ambulatory Visit | Attending: Radiation Oncology | Admitting: Radiation Oncology

## 2015-06-19 ENCOUNTER — Ambulatory Visit: Payer: Medicare Other

## 2015-06-19 ENCOUNTER — Other Ambulatory Visit: Payer: Self-pay | Admitting: Family Medicine

## 2015-06-19 DIAGNOSIS — M109 Gout, unspecified: Secondary | ICD-10-CM | POA: Diagnosis not present

## 2015-06-19 DIAGNOSIS — Z9889 Other specified postprocedural states: Secondary | ICD-10-CM | POA: Diagnosis not present

## 2015-06-19 DIAGNOSIS — E119 Type 2 diabetes mellitus without complications: Secondary | ICD-10-CM | POA: Diagnosis not present

## 2015-06-19 DIAGNOSIS — Z510 Encounter for antineoplastic radiation therapy: Secondary | ICD-10-CM | POA: Diagnosis not present

## 2015-06-19 DIAGNOSIS — Z7982 Long term (current) use of aspirin: Secondary | ICD-10-CM | POA: Diagnosis not present

## 2015-06-19 DIAGNOSIS — C61 Malignant neoplasm of prostate: Secondary | ICD-10-CM | POA: Diagnosis not present

## 2015-06-19 DIAGNOSIS — R9721 Rising PSA following treatment for malignant neoplasm of prostate: Secondary | ICD-10-CM | POA: Diagnosis not present

## 2015-06-19 DIAGNOSIS — E785 Hyperlipidemia, unspecified: Secondary | ICD-10-CM | POA: Diagnosis not present

## 2015-06-20 ENCOUNTER — Ambulatory Visit
Admission: RE | Admit: 2015-06-20 | Discharge: 2015-06-20 | Disposition: A | Discharge status: Home / Self Care | Payer: Medicare Other | Source: Ambulatory Visit | Attending: Radiation Oncology | Admitting: Radiation Oncology

## 2015-06-20 ENCOUNTER — Ambulatory Visit: Payer: Medicare Other

## 2015-06-20 VITALS — BP 112/70 | HR 82 | Resp 16 | Wt 273.5 lb

## 2015-06-20 DIAGNOSIS — C61 Malignant neoplasm of prostate: Secondary | ICD-10-CM | POA: Diagnosis not present

## 2015-06-20 DIAGNOSIS — Z7982 Long term (current) use of aspirin: Secondary | ICD-10-CM | POA: Diagnosis not present

## 2015-06-20 DIAGNOSIS — Z9889 Other specified postprocedural states: Secondary | ICD-10-CM | POA: Diagnosis not present

## 2015-06-20 DIAGNOSIS — Z510 Encounter for antineoplastic radiation therapy: Secondary | ICD-10-CM | POA: Diagnosis not present

## 2015-06-20 DIAGNOSIS — E785 Hyperlipidemia, unspecified: Secondary | ICD-10-CM | POA: Diagnosis not present

## 2015-06-20 DIAGNOSIS — R9721 Rising PSA following treatment for malignant neoplasm of prostate: Secondary | ICD-10-CM | POA: Diagnosis not present

## 2015-06-20 DIAGNOSIS — M109 Gout, unspecified: Secondary | ICD-10-CM | POA: Diagnosis not present

## 2015-06-20 DIAGNOSIS — E119 Type 2 diabetes mellitus without complications: Secondary | ICD-10-CM | POA: Diagnosis not present

## 2015-06-20 NOTE — Progress Notes (Signed)
  Radiation Oncology         [phone]   Name: LEXINGTON DOHRN MRN: [id number]   Date: 06/20/2015  DOB: Feb 15, 1951   Weekly Radiation Therapy Management    ICD-9-CM ICD-10-CM   1. Malignant neoplasm of prostate ([facility]) 185 C61     Current Dose: 14.4 Gy  Planned Dose:  68.4 Gy  Narrative The patient presents for routine under treatment assessment.   on review of systems, he reports that he is doing quite well overall and denies any dysuria, hematuria, hesitancy or frequency. He reports nocturia x 1, and describes a strong steady urine stream without difficulty emptying. Reports rare urinary incontinence. Denies diarrhea. Denies fatigue. No other complaints or verbalized.   Set-up films were reviewed. The chart was checked.  Physical Findings  weight is 273 lb 8 oz (124.059 kg). His blood pressure is 112/70 and his pulse is 82. His respiration is 16 and oxygen saturation is 100%.   Pain scale 0/10  in general this is a well-appearing African-American male in no acute distress. He is alert and oriented times for appropriate during the examination. Cardiopulmonary assessment is  Reveals normal effort in no visible distress.  Impression  Adenocarcinoma of the prostate , status post prostatectomy receiving salvage therapy to the prostatic fossa  Plan  the patient is tolerating his radiation treatments very well at this time. We discussed continuing on with his treatment , and he'll chemotherapy any questions or concerns arise  prior to his next visit.       The above documentation reflects my direct findings during this shared patient visit. Please see the separate note by Dr. Tammi Klippel on this date for the remainder of the patient's plan of care.  Carola Rhine, PAC   This document serves as a record of services personally performed by Shona Simpson PAC and Tyler Pita, MD. It was created on their behalf by Jenell Milliner, a trained medical scribe. The creation of this record is  based on the scribe's personal observations and the provider's statements to them. This document has been checked and approved by the attending provider.

## 2015-06-20 NOTE — Telephone Encounter (Signed)
Refill appropriate and filled per protocol. 

## 2015-06-20 NOTE — Progress Notes (Signed)
Weight and vitals stable. Denies pain. Reports nocturia x 1. Denies dysuria or hematuria. Describes a strong steady urine stream without difficulty emptying. Reports rare urinary incontinence. Denies diarrhea. Denies fatigue.   BP 112/70 mmHg  Pulse 82  Resp 16  Wt 273 lb 8 oz (124.059 kg)  SpO2 100% Wt Readings from Last 3 Encounters:  06/20/15 273 lb 8 oz (124.059 kg)  06/13/15 273 lb 6.4 oz (124.013 kg)  04/29/15 274 lb 6.4 oz (124.467 kg)

## 2015-06-21 ENCOUNTER — Ambulatory Visit
Admission: RE | Admit: 2015-06-21 | Discharge: 2015-06-21 | Disposition: A | Discharge status: Home / Self Care | Payer: Medicare Other | Source: Ambulatory Visit | Attending: Radiation Oncology | Admitting: Radiation Oncology

## 2015-06-21 ENCOUNTER — Ambulatory Visit: Payer: Medicare Other

## 2015-06-21 DIAGNOSIS — R9721 Rising PSA following treatment for malignant neoplasm of prostate: Secondary | ICD-10-CM | POA: Diagnosis not present

## 2015-06-21 DIAGNOSIS — C61 Malignant neoplasm of prostate: Secondary | ICD-10-CM | POA: Diagnosis not present

## 2015-06-21 DIAGNOSIS — E119 Type 2 diabetes mellitus without complications: Secondary | ICD-10-CM | POA: Diagnosis not present

## 2015-06-21 DIAGNOSIS — M109 Gout, unspecified: Secondary | ICD-10-CM | POA: Diagnosis not present

## 2015-06-21 DIAGNOSIS — E785 Hyperlipidemia, unspecified: Secondary | ICD-10-CM | POA: Diagnosis not present

## 2015-06-21 DIAGNOSIS — Z510 Encounter for antineoplastic radiation therapy: Secondary | ICD-10-CM | POA: Diagnosis not present

## 2015-06-21 DIAGNOSIS — Z9889 Other specified postprocedural states: Secondary | ICD-10-CM | POA: Diagnosis not present

## 2015-06-21 DIAGNOSIS — Z7982 Long term (current) use of aspirin: Secondary | ICD-10-CM | POA: Diagnosis not present

## 2015-06-21 NOTE — Patient Instructions (Signed)
Contact our office if you have any questions following today's appointment: [phone].  

## 2015-06-24 ENCOUNTER — Ambulatory Visit: Payer: Medicare Other

## 2015-06-24 ENCOUNTER — Ambulatory Visit
Admission: RE | Admit: 2015-06-24 | Discharge: 2015-06-24 | Disposition: A | Discharge status: Home / Self Care | Payer: Medicare Other | Source: Ambulatory Visit | Attending: Radiation Oncology | Admitting: Radiation Oncology

## 2015-06-24 DIAGNOSIS — M109 Gout, unspecified: Secondary | ICD-10-CM | POA: Diagnosis not present

## 2015-06-24 DIAGNOSIS — Z9889 Other specified postprocedural states: Secondary | ICD-10-CM | POA: Diagnosis not present

## 2015-06-24 DIAGNOSIS — C61 Malignant neoplasm of prostate: Secondary | ICD-10-CM | POA: Diagnosis not present

## 2015-06-24 DIAGNOSIS — R9721 Rising PSA following treatment for malignant neoplasm of prostate: Secondary | ICD-10-CM | POA: Diagnosis not present

## 2015-06-24 DIAGNOSIS — E785 Hyperlipidemia, unspecified: Secondary | ICD-10-CM | POA: Diagnosis not present

## 2015-06-24 DIAGNOSIS — E119 Type 2 diabetes mellitus without complications: Secondary | ICD-10-CM | POA: Diagnosis not present

## 2015-06-24 DIAGNOSIS — Z510 Encounter for antineoplastic radiation therapy: Secondary | ICD-10-CM | POA: Diagnosis not present

## 2015-06-24 DIAGNOSIS — Z7982 Long term (current) use of aspirin: Secondary | ICD-10-CM | POA: Diagnosis not present

## 2015-06-25 ENCOUNTER — Ambulatory Visit: Payer: Medicare Other

## 2015-06-25 ENCOUNTER — Ambulatory Visit
Admission: RE | Admit: 2015-06-25 | Discharge: 2015-06-25 | Disposition: A | Discharge status: Home / Self Care | Payer: Medicare Other | Source: Ambulatory Visit | Attending: Radiation Oncology | Admitting: Radiation Oncology

## 2015-06-25 DIAGNOSIS — E119 Type 2 diabetes mellitus without complications: Secondary | ICD-10-CM | POA: Diagnosis not present

## 2015-06-25 DIAGNOSIS — E785 Hyperlipidemia, unspecified: Secondary | ICD-10-CM | POA: Diagnosis not present

## 2015-06-25 DIAGNOSIS — C61 Malignant neoplasm of prostate: Secondary | ICD-10-CM | POA: Diagnosis not present

## 2015-06-25 DIAGNOSIS — Z7982 Long term (current) use of aspirin: Secondary | ICD-10-CM | POA: Diagnosis not present

## 2015-06-25 DIAGNOSIS — M109 Gout, unspecified: Secondary | ICD-10-CM | POA: Diagnosis not present

## 2015-06-25 DIAGNOSIS — Z510 Encounter for antineoplastic radiation therapy: Secondary | ICD-10-CM | POA: Diagnosis not present

## 2015-06-25 DIAGNOSIS — R9721 Rising PSA following treatment for malignant neoplasm of prostate: Secondary | ICD-10-CM | POA: Diagnosis not present

## 2015-06-25 DIAGNOSIS — Z9889 Other specified postprocedural states: Secondary | ICD-10-CM | POA: Diagnosis not present

## 2015-06-26 ENCOUNTER — Ambulatory Visit
Admission: RE | Admit: 2015-06-26 | Discharge: 2015-06-26 | Disposition: A | Discharge status: Home / Self Care | Payer: Medicare Other | Source: Ambulatory Visit | Attending: Radiation Oncology | Admitting: Radiation Oncology

## 2015-06-26 ENCOUNTER — Ambulatory Visit: Payer: Medicare Other

## 2015-06-26 DIAGNOSIS — Z510 Encounter for antineoplastic radiation therapy: Secondary | ICD-10-CM | POA: Diagnosis not present

## 2015-06-26 DIAGNOSIS — C61 Malignant neoplasm of prostate: Secondary | ICD-10-CM | POA: Diagnosis not present

## 2015-06-26 DIAGNOSIS — R9721 Rising PSA following treatment for malignant neoplasm of prostate: Secondary | ICD-10-CM | POA: Diagnosis not present

## 2015-06-26 DIAGNOSIS — M109 Gout, unspecified: Secondary | ICD-10-CM | POA: Diagnosis not present

## 2015-06-26 DIAGNOSIS — E785 Hyperlipidemia, unspecified: Secondary | ICD-10-CM | POA: Diagnosis not present

## 2015-06-26 DIAGNOSIS — Z9889 Other specified postprocedural states: Secondary | ICD-10-CM | POA: Diagnosis not present

## 2015-06-26 DIAGNOSIS — E119 Type 2 diabetes mellitus without complications: Secondary | ICD-10-CM | POA: Diagnosis not present

## 2015-06-26 DIAGNOSIS — Z7982 Long term (current) use of aspirin: Secondary | ICD-10-CM | POA: Diagnosis not present

## 2015-06-27 ENCOUNTER — Ambulatory Visit
Admission: RE | Admit: 2015-06-27 | Discharge: 2015-06-27 | Disposition: A | Discharge status: Home / Self Care | Payer: Medicare Other | Source: Ambulatory Visit | Attending: Radiation Oncology | Admitting: Radiation Oncology

## 2015-06-27 ENCOUNTER — Ambulatory Visit: Payer: Medicare Other

## 2015-06-27 ENCOUNTER — Encounter: Payer: Self-pay | Admitting: Radiation Oncology

## 2015-06-27 VITALS — BP 108/66 | HR 73 | Resp 16 | Wt 273.9 lb

## 2015-06-27 DIAGNOSIS — M109 Gout, unspecified: Secondary | ICD-10-CM | POA: Diagnosis not present

## 2015-06-27 DIAGNOSIS — Z510 Encounter for antineoplastic radiation therapy: Secondary | ICD-10-CM | POA: Diagnosis not present

## 2015-06-27 DIAGNOSIS — R9721 Rising PSA following treatment for malignant neoplasm of prostate: Secondary | ICD-10-CM | POA: Diagnosis not present

## 2015-06-27 DIAGNOSIS — C61 Malignant neoplasm of prostate: Secondary | ICD-10-CM

## 2015-06-27 DIAGNOSIS — Z9889 Other specified postprocedural states: Secondary | ICD-10-CM | POA: Diagnosis not present

## 2015-06-27 DIAGNOSIS — E785 Hyperlipidemia, unspecified: Secondary | ICD-10-CM | POA: Diagnosis not present

## 2015-06-27 DIAGNOSIS — E119 Type 2 diabetes mellitus without complications: Secondary | ICD-10-CM | POA: Diagnosis not present

## 2015-06-27 DIAGNOSIS — Z7982 Long term (current) use of aspirin: Secondary | ICD-10-CM | POA: Diagnosis not present

## 2015-06-27 NOTE — Progress Notes (Signed)
  Radiation Oncology         [phone]   Name: Curtis Hunt MRN: [id number]   Date: 06/27/2015  DOB: 1951-03-16   Weekly Radiation Therapy Management    ICD-9-CM ICD-10-CM   1. Malignant neoplasm of prostate ([facility]) 185 C61     Current Dose: 23.4 Gy  Planned Dose:  68.4 Gy  Narrative The patient presents for routine under treatment assessment.  Patient has completed 13/38 treatments. Weight and vitals stable. Denies pain. Reports nocturia x1. Denies dysuria or hematuria. Describes a strong steady urine stream without difficulty emptying. Reports stress incontinence continues. Reports that incontinence occurs upon sneezing or coughing. Denies fatigue.   .   Set-up films were reviewed. The chart was checked.  Physical Findings  weight is 273 lb 14.4 oz (124.24 kg). His blood pressure is 108/66 and his pulse is 73. His respiration is 16 and oxygen saturation is 100%.  Weight essentially stable. No significant changes.  Impression The patient is tolerating radiation.  Plan Continue treatment as planned.         Sheral Apley Tammi Klippel, M.D.  This document serves as a record of services personally performed by Tyler Pita, MD. It was created on his behalf by Jenell Milliner, a trained medical scribe. The creation of this record is based on the scribe's personal observations and the provider's statements to them. This document has been checked and approved by the attending provider.

## 2015-06-27 NOTE — Progress Notes (Signed)
Weight and vitals stable. Denies pain. Reports nocturia x1. Denies dysuria or hematuria. Describes a strong steady urine stream without difficulty emptying. Reports stress incontinence continues. Denies fatigue.   BP 108/66 mmHg  Pulse 73  Resp 16  Wt 273 lb 14.4 oz (124.24 kg)  SpO2 100% Wt Readings from Last 3 Encounters:  06/27/15 273 lb 14.4 oz (124.24 kg)  06/20/15 273 lb 8 oz (124.059 kg)  06/13/15 273 lb 6.4 oz (124.013 kg)

## 2015-06-27 NOTE — Addendum Note (Signed)
Encounter addended by: Heywood Footman, RN on: 06/27/2015 10:07 AM<BR>     Documentation filed: Notes Section, Chief Complaint Section

## 2015-06-28 ENCOUNTER — Ambulatory Visit
Admission: RE | Admit: 2015-06-28 | Discharge: 2015-06-28 | Disposition: A | Discharge status: Home / Self Care | Payer: Medicare Other | Source: Ambulatory Visit | Attending: Radiation Oncology | Admitting: Radiation Oncology

## 2015-06-28 ENCOUNTER — Ambulatory Visit: Payer: Medicare Other

## 2015-06-28 DIAGNOSIS — Z510 Encounter for antineoplastic radiation therapy: Secondary | ICD-10-CM | POA: Diagnosis not present

## 2015-06-28 DIAGNOSIS — C61 Malignant neoplasm of prostate: Secondary | ICD-10-CM | POA: Diagnosis not present

## 2015-06-28 DIAGNOSIS — E785 Hyperlipidemia, unspecified: Secondary | ICD-10-CM | POA: Diagnosis not present

## 2015-06-28 DIAGNOSIS — Z7982 Long term (current) use of aspirin: Secondary | ICD-10-CM | POA: Diagnosis not present

## 2015-06-28 DIAGNOSIS — Z9889 Other specified postprocedural states: Secondary | ICD-10-CM | POA: Diagnosis not present

## 2015-06-28 DIAGNOSIS — E119 Type 2 diabetes mellitus without complications: Secondary | ICD-10-CM | POA: Diagnosis not present

## 2015-06-28 DIAGNOSIS — M109 Gout, unspecified: Secondary | ICD-10-CM | POA: Diagnosis not present

## 2015-06-28 DIAGNOSIS — R9721 Rising PSA following treatment for malignant neoplasm of prostate: Secondary | ICD-10-CM | POA: Diagnosis not present

## 2015-07-01 ENCOUNTER — Ambulatory Visit: Payer: Medicare Other

## 2015-07-01 ENCOUNTER — Ambulatory Visit
Admission: RE | Admit: 2015-07-01 | Discharge: 2015-07-01 | Disposition: A | Discharge status: Home / Self Care | Payer: Medicare Other | Source: Ambulatory Visit | Attending: Radiation Oncology | Admitting: Radiation Oncology

## 2015-07-01 DIAGNOSIS — C61 Malignant neoplasm of prostate: Secondary | ICD-10-CM | POA: Diagnosis not present

## 2015-07-01 DIAGNOSIS — M109 Gout, unspecified: Secondary | ICD-10-CM | POA: Diagnosis not present

## 2015-07-01 DIAGNOSIS — Z7982 Long term (current) use of aspirin: Secondary | ICD-10-CM | POA: Diagnosis not present

## 2015-07-01 DIAGNOSIS — Z9889 Other specified postprocedural states: Secondary | ICD-10-CM | POA: Diagnosis not present

## 2015-07-01 DIAGNOSIS — Z510 Encounter for antineoplastic radiation therapy: Secondary | ICD-10-CM | POA: Diagnosis not present

## 2015-07-01 DIAGNOSIS — R9721 Rising PSA following treatment for malignant neoplasm of prostate: Secondary | ICD-10-CM | POA: Diagnosis not present

## 2015-07-01 DIAGNOSIS — E785 Hyperlipidemia, unspecified: Secondary | ICD-10-CM | POA: Diagnosis not present

## 2015-07-01 DIAGNOSIS — E119 Type 2 diabetes mellitus without complications: Secondary | ICD-10-CM | POA: Diagnosis not present

## 2015-07-02 ENCOUNTER — Ambulatory Visit: Payer: Medicare Other

## 2015-07-02 ENCOUNTER — Ambulatory Visit
Admission: RE | Admit: 2015-07-02 | Discharge: 2015-07-02 | Disposition: A | Discharge status: Home / Self Care | Payer: Medicare Other | Source: Ambulatory Visit | Attending: Radiation Oncology | Admitting: Radiation Oncology

## 2015-07-02 DIAGNOSIS — Z7982 Long term (current) use of aspirin: Secondary | ICD-10-CM | POA: Diagnosis not present

## 2015-07-02 DIAGNOSIS — E785 Hyperlipidemia, unspecified: Secondary | ICD-10-CM | POA: Diagnosis not present

## 2015-07-02 DIAGNOSIS — M109 Gout, unspecified: Secondary | ICD-10-CM | POA: Diagnosis not present

## 2015-07-02 DIAGNOSIS — E119 Type 2 diabetes mellitus without complications: Secondary | ICD-10-CM | POA: Diagnosis not present

## 2015-07-02 DIAGNOSIS — C61 Malignant neoplasm of prostate: Secondary | ICD-10-CM | POA: Diagnosis not present

## 2015-07-02 DIAGNOSIS — R9721 Rising PSA following treatment for malignant neoplasm of prostate: Secondary | ICD-10-CM | POA: Diagnosis not present

## 2015-07-02 DIAGNOSIS — Z510 Encounter for antineoplastic radiation therapy: Secondary | ICD-10-CM | POA: Diagnosis not present

## 2015-07-02 DIAGNOSIS — Z9889 Other specified postprocedural states: Secondary | ICD-10-CM | POA: Diagnosis not present

## 2015-07-03 ENCOUNTER — Ambulatory Visit: Payer: Medicare Other

## 2015-07-03 ENCOUNTER — Ambulatory Visit
Admission: RE | Admit: 2015-07-03 | Discharge: 2015-07-03 | Disposition: A | Discharge status: Home / Self Care | Payer: Medicare Other | Source: Ambulatory Visit | Attending: Radiation Oncology | Admitting: Radiation Oncology

## 2015-07-03 DIAGNOSIS — E119 Type 2 diabetes mellitus without complications: Secondary | ICD-10-CM | POA: Diagnosis not present

## 2015-07-03 DIAGNOSIS — Z7982 Long term (current) use of aspirin: Secondary | ICD-10-CM | POA: Diagnosis not present

## 2015-07-03 DIAGNOSIS — Z510 Encounter for antineoplastic radiation therapy: Secondary | ICD-10-CM | POA: Diagnosis not present

## 2015-07-03 DIAGNOSIS — Z9889 Other specified postprocedural states: Secondary | ICD-10-CM | POA: Diagnosis not present

## 2015-07-03 DIAGNOSIS — E785 Hyperlipidemia, unspecified: Secondary | ICD-10-CM | POA: Diagnosis not present

## 2015-07-03 DIAGNOSIS — M109 Gout, unspecified: Secondary | ICD-10-CM | POA: Diagnosis not present

## 2015-07-03 DIAGNOSIS — C61 Malignant neoplasm of prostate: Secondary | ICD-10-CM | POA: Diagnosis not present

## 2015-07-03 DIAGNOSIS — R9721 Rising PSA following treatment for malignant neoplasm of prostate: Secondary | ICD-10-CM | POA: Diagnosis not present

## 2015-07-04 ENCOUNTER — Ambulatory Visit: Payer: Medicare Other

## 2015-07-04 ENCOUNTER — Ambulatory Visit
Admission: RE | Admit: 2015-07-04 | Discharge: 2015-07-04 | Disposition: A | Discharge status: Home / Self Care | Payer: Medicare Other | Source: Ambulatory Visit | Attending: Radiation Oncology | Admitting: Radiation Oncology

## 2015-07-04 VITALS — BP 124/75 | HR 79 | Resp 16 | Wt 276.5 lb

## 2015-07-04 DIAGNOSIS — E119 Type 2 diabetes mellitus without complications: Secondary | ICD-10-CM | POA: Diagnosis not present

## 2015-07-04 DIAGNOSIS — Z7982 Long term (current) use of aspirin: Secondary | ICD-10-CM | POA: Diagnosis not present

## 2015-07-04 DIAGNOSIS — Z510 Encounter for antineoplastic radiation therapy: Secondary | ICD-10-CM | POA: Diagnosis not present

## 2015-07-04 DIAGNOSIS — Z9889 Other specified postprocedural states: Secondary | ICD-10-CM | POA: Diagnosis not present

## 2015-07-04 DIAGNOSIS — C61 Malignant neoplasm of prostate: Secondary | ICD-10-CM | POA: Diagnosis not present

## 2015-07-04 DIAGNOSIS — E785 Hyperlipidemia, unspecified: Secondary | ICD-10-CM | POA: Diagnosis not present

## 2015-07-04 DIAGNOSIS — M109 Gout, unspecified: Secondary | ICD-10-CM | POA: Diagnosis not present

## 2015-07-04 DIAGNOSIS — R9721 Rising PSA following treatment for malignant neoplasm of prostate: Secondary | ICD-10-CM | POA: Diagnosis not present

## 2015-07-04 NOTE — Progress Notes (Signed)
Evaluation completed by Shona Simpson, PA.

## 2015-07-04 NOTE — Progress Notes (Signed)
  Radiation Oncology         [phone]   Name: Curtis Hunt MRN: [id number]   Date: 07/04/2015  DOB: [birth year]     Weekly Radiation Therapy Management    ICD-9-CM ICD-10-CM   1. Malignant neoplasm of prostate ([place]) 185 C61     Current Dose: 32.4 Gy  Planned Dose:  68.4 Gy  Narrative The patient presents for routine under treatment assessment.  Weight and vitals stable. Denies pain. Reports nocturia x1. Denies dysuria or hematuria. Describes a strong steady urine stream without difficulty emptying. Reports stress incontinence continues. Denies fatigue.   BP 108/66 mmHg  Pulse 73  Resp 16  Wt 273 lb 14.4 oz (124.24 kg)  SpO2 100% Wt Readings from Last 3 Encounters:  06/27/15 273 lb 14.4 oz (124.24 kg)  06/20/15 273 lb 8 oz (124.059 kg)  06/13/15 273 lb 6.4 oz (124.013 kg)   .   Set-up films were reviewed. The chart was checked.  Physical Findings  weight is 276 lb 8 oz (125.42 kg). His blood pressure is 124/75 and his pulse is 79. His respiration is 16 and oxygen saturation is 100%.  Weight essentially stable. No significant changes.  Impression The patient is tolerating radiation.  Plan Continue treatment as planned.     Sheral Apley Tammi Klippel, M.D.  This document serves as a record of services personally performed by Tyler Pita, MD. It was created on his behalf by Derek Mound, a trained medical scribe. The creation of this record is based on the scribe's personal observations and the provider's statements to them. This document has been checked and approved by the attending provider.

## 2015-07-05 ENCOUNTER — Ambulatory Visit
Admission: RE | Admit: 2015-07-05 | Discharge: 2015-07-05 | Disposition: A | Discharge status: Home / Self Care | Payer: Medicare Other | Source: Ambulatory Visit | Attending: Radiation Oncology | Admitting: Radiation Oncology

## 2015-07-05 ENCOUNTER — Ambulatory Visit: Payer: Medicare Other

## 2015-07-05 DIAGNOSIS — Z9889 Other specified postprocedural states: Secondary | ICD-10-CM | POA: Diagnosis not present

## 2015-07-05 DIAGNOSIS — E785 Hyperlipidemia, unspecified: Secondary | ICD-10-CM | POA: Diagnosis not present

## 2015-07-05 DIAGNOSIS — C61 Malignant neoplasm of prostate: Secondary | ICD-10-CM | POA: Diagnosis not present

## 2015-07-05 DIAGNOSIS — Z510 Encounter for antineoplastic radiation therapy: Secondary | ICD-10-CM | POA: Diagnosis not present

## 2015-07-05 DIAGNOSIS — Z7982 Long term (current) use of aspirin: Secondary | ICD-10-CM | POA: Diagnosis not present

## 2015-07-05 DIAGNOSIS — R9721 Rising PSA following treatment for malignant neoplasm of prostate: Secondary | ICD-10-CM | POA: Diagnosis not present

## 2015-07-05 DIAGNOSIS — E119 Type 2 diabetes mellitus without complications: Secondary | ICD-10-CM | POA: Diagnosis not present

## 2015-07-05 DIAGNOSIS — M109 Gout, unspecified: Secondary | ICD-10-CM | POA: Diagnosis not present

## 2015-07-08 ENCOUNTER — Ambulatory Visit: Payer: Medicare Other

## 2015-07-08 ENCOUNTER — Ambulatory Visit
Admission: RE | Admit: 2015-07-08 | Discharge: 2015-07-08 | Disposition: A | Discharge status: Home / Self Care | Payer: Medicare Other | Source: Ambulatory Visit | Attending: Radiation Oncology | Admitting: Radiation Oncology

## 2015-07-08 DIAGNOSIS — Z9889 Other specified postprocedural states: Secondary | ICD-10-CM | POA: Diagnosis not present

## 2015-07-08 DIAGNOSIS — Z510 Encounter for antineoplastic radiation therapy: Secondary | ICD-10-CM | POA: Diagnosis not present

## 2015-07-08 DIAGNOSIS — Z7982 Long term (current) use of aspirin: Secondary | ICD-10-CM | POA: Diagnosis not present

## 2015-07-08 DIAGNOSIS — E119 Type 2 diabetes mellitus without complications: Secondary | ICD-10-CM | POA: Diagnosis not present

## 2015-07-08 DIAGNOSIS — M109 Gout, unspecified: Secondary | ICD-10-CM | POA: Diagnosis not present

## 2015-07-08 DIAGNOSIS — C61 Malignant neoplasm of prostate: Secondary | ICD-10-CM | POA: Diagnosis not present

## 2015-07-08 DIAGNOSIS — R9721 Rising PSA following treatment for malignant neoplasm of prostate: Secondary | ICD-10-CM | POA: Diagnosis not present

## 2015-07-08 DIAGNOSIS — E785 Hyperlipidemia, unspecified: Secondary | ICD-10-CM | POA: Diagnosis not present

## 2015-07-09 ENCOUNTER — Ambulatory Visit
Admission: RE | Admit: 2015-07-09 | Discharge: 2015-07-09 | Disposition: A | Discharge status: Home / Self Care | Payer: Medicare Other | Source: Ambulatory Visit | Attending: Radiation Oncology | Admitting: Radiation Oncology

## 2015-07-09 ENCOUNTER — Ambulatory Visit: Payer: Medicare Other

## 2015-07-09 DIAGNOSIS — Z510 Encounter for antineoplastic radiation therapy: Secondary | ICD-10-CM | POA: Diagnosis not present

## 2015-07-09 DIAGNOSIS — C61 Malignant neoplasm of prostate: Secondary | ICD-10-CM | POA: Diagnosis not present

## 2015-07-09 DIAGNOSIS — E785 Hyperlipidemia, unspecified: Secondary | ICD-10-CM | POA: Diagnosis not present

## 2015-07-09 DIAGNOSIS — M109 Gout, unspecified: Secondary | ICD-10-CM | POA: Diagnosis not present

## 2015-07-09 DIAGNOSIS — R9721 Rising PSA following treatment for malignant neoplasm of prostate: Secondary | ICD-10-CM | POA: Diagnosis not present

## 2015-07-09 DIAGNOSIS — E119 Type 2 diabetes mellitus without complications: Secondary | ICD-10-CM | POA: Diagnosis not present

## 2015-07-09 DIAGNOSIS — Z9889 Other specified postprocedural states: Secondary | ICD-10-CM | POA: Diagnosis not present

## 2015-07-09 DIAGNOSIS — Z7982 Long term (current) use of aspirin: Secondary | ICD-10-CM | POA: Diagnosis not present

## 2015-07-10 ENCOUNTER — Ambulatory Visit
Admission: RE | Admit: 2015-07-10 | Discharge: 2015-07-10 | Disposition: A | Discharge status: Home / Self Care | Payer: Medicare Other | Source: Ambulatory Visit | Attending: Radiation Oncology | Admitting: Radiation Oncology

## 2015-07-10 ENCOUNTER — Ambulatory Visit: Payer: Medicare Other

## 2015-07-10 DIAGNOSIS — Z7982 Long term (current) use of aspirin: Secondary | ICD-10-CM | POA: Diagnosis not present

## 2015-07-10 DIAGNOSIS — M109 Gout, unspecified: Secondary | ICD-10-CM | POA: Diagnosis not present

## 2015-07-10 DIAGNOSIS — R9721 Rising PSA following treatment for malignant neoplasm of prostate: Secondary | ICD-10-CM | POA: Diagnosis not present

## 2015-07-10 DIAGNOSIS — Z9889 Other specified postprocedural states: Secondary | ICD-10-CM | POA: Diagnosis not present

## 2015-07-10 DIAGNOSIS — E119 Type 2 diabetes mellitus without complications: Secondary | ICD-10-CM | POA: Diagnosis not present

## 2015-07-10 DIAGNOSIS — C61 Malignant neoplasm of prostate: Secondary | ICD-10-CM | POA: Diagnosis not present

## 2015-07-10 DIAGNOSIS — E785 Hyperlipidemia, unspecified: Secondary | ICD-10-CM | POA: Diagnosis not present

## 2015-07-10 DIAGNOSIS — Z510 Encounter for antineoplastic radiation therapy: Secondary | ICD-10-CM | POA: Diagnosis not present

## 2015-07-11 ENCOUNTER — Ambulatory Visit: Payer: Medicare Other

## 2015-07-11 ENCOUNTER — Ambulatory Visit
Admission: RE | Admit: 2015-07-11 | Discharge: 2015-07-11 | Disposition: A | Discharge status: Home / Self Care | Payer: Medicare Other | Source: Ambulatory Visit | Attending: Radiation Oncology | Admitting: Radiation Oncology

## 2015-07-11 VITALS — BP 122/69 | HR 77 | Resp 16 | Wt 275.3 lb

## 2015-07-11 DIAGNOSIS — Z510 Encounter for antineoplastic radiation therapy: Secondary | ICD-10-CM | POA: Diagnosis not present

## 2015-07-11 DIAGNOSIS — R9721 Rising PSA following treatment for malignant neoplasm of prostate: Secondary | ICD-10-CM | POA: Diagnosis not present

## 2015-07-11 DIAGNOSIS — C61 Malignant neoplasm of prostate: Secondary | ICD-10-CM

## 2015-07-11 DIAGNOSIS — Z9889 Other specified postprocedural states: Secondary | ICD-10-CM | POA: Diagnosis not present

## 2015-07-11 DIAGNOSIS — M109 Gout, unspecified: Secondary | ICD-10-CM | POA: Diagnosis not present

## 2015-07-11 DIAGNOSIS — E119 Type 2 diabetes mellitus without complications: Secondary | ICD-10-CM | POA: Diagnosis not present

## 2015-07-11 DIAGNOSIS — Z7982 Long term (current) use of aspirin: Secondary | ICD-10-CM | POA: Diagnosis not present

## 2015-07-11 DIAGNOSIS — E785 Hyperlipidemia, unspecified: Secondary | ICD-10-CM | POA: Diagnosis not present

## 2015-07-11 NOTE — Progress Notes (Signed)
Weight and vitals stable. Denies pain. Reports nocturia x3. Reports one episode of dysuria. Denies hematuria. Describes strong steady urine stream without difficulty emptying. Reports stress incontinence continues. Denies fatigue. Reports he continues to exercise regularly.   BP 122/69 mmHg  Pulse 77  Resp 16  Wt 275 lb 4.8 oz (124.875 kg)  SpO2 100% Wt Readings from Last 3 Encounters:  07/11/15 275 lb 4.8 oz (124.875 kg)  07/04/15 276 lb 8 oz (125.42 kg)  06/27/15 273 lb 14.4 oz (124.24 kg)

## 2015-07-11 NOTE — Progress Notes (Signed)
  Radiation Oncology         [phone]   Name: Curtis Hunt MRN: [id number]   Date: 07/11/2015  DOB: 1950-11-08     Weekly Radiation Therapy Management    ICD-9-CM ICD-10-CM   1. Malignant neoplasm of prostate ([facility]) 185 C61     Current Dose: 41.4 Gy  Planned Dose:  68.4 Gy  Narrative The patient presents for routine under treatment assessment.  Denies pain. Reports nocturia x3. Reports one episode of dysuria. Denies hematuria. Describes strong steady urine stream without difficulty emptying. Reports stress incontinence continues. Denies fatigue. Denies diarrhea, but has a loose stool. Reports he continues to exercise regularly.  Set-up films were reviewed. The chart was checked.  Physical Findings  weight is 275 lb 4.8 oz (124.875 kg). His blood pressure is 122/69 and his pulse is 77. His respiration is 16 and oxygen saturation is 100%.  Weight essentially stable. No significant changes.  Impression The patient is tolerating radiation.  Plan Continue treatment as planned.     Sheral Apley Tammi Klippel, M.D.  This document serves as a record of services personally performed by Tyler Pita, MD. It was created on his behalf by Darcus Austin, a trained medical scribe. The creation of this record is based on the scribe's personal observations and the provider's statements to them. This document has been checked and approved by the attending provider.

## 2015-07-12 ENCOUNTER — Ambulatory Visit: Payer: Medicare Other

## 2015-07-12 ENCOUNTER — Ambulatory Visit
Admission: RE | Admit: 2015-07-12 | Discharge: 2015-07-12 | Disposition: A | Discharge status: Home / Self Care | Payer: Medicare Other | Source: Ambulatory Visit | Attending: Radiation Oncology | Admitting: Radiation Oncology

## 2015-07-12 DIAGNOSIS — R9721 Rising PSA following treatment for malignant neoplasm of prostate: Secondary | ICD-10-CM | POA: Diagnosis not present

## 2015-07-12 DIAGNOSIS — Z7982 Long term (current) use of aspirin: Secondary | ICD-10-CM | POA: Diagnosis not present

## 2015-07-12 DIAGNOSIS — E119 Type 2 diabetes mellitus without complications: Secondary | ICD-10-CM | POA: Diagnosis not present

## 2015-07-12 DIAGNOSIS — C61 Malignant neoplasm of prostate: Secondary | ICD-10-CM | POA: Diagnosis not present

## 2015-07-12 DIAGNOSIS — Z510 Encounter for antineoplastic radiation therapy: Secondary | ICD-10-CM | POA: Diagnosis not present

## 2015-07-12 DIAGNOSIS — E785 Hyperlipidemia, unspecified: Secondary | ICD-10-CM | POA: Diagnosis not present

## 2015-07-12 DIAGNOSIS — Z9889 Other specified postprocedural states: Secondary | ICD-10-CM | POA: Diagnosis not present

## 2015-07-12 DIAGNOSIS — M109 Gout, unspecified: Secondary | ICD-10-CM | POA: Diagnosis not present

## 2015-07-15 ENCOUNTER — Ambulatory Visit
Admission: RE | Admit: 2015-07-15 | Discharge: 2015-07-15 | Disposition: A | Discharge status: Home / Self Care | Payer: Medicare Other | Source: Ambulatory Visit | Attending: Radiation Oncology | Admitting: Radiation Oncology

## 2015-07-15 ENCOUNTER — Ambulatory Visit: Payer: Medicare Other

## 2015-07-15 DIAGNOSIS — E119 Type 2 diabetes mellitus without complications: Secondary | ICD-10-CM | POA: Diagnosis not present

## 2015-07-15 DIAGNOSIS — Z7982 Long term (current) use of aspirin: Secondary | ICD-10-CM | POA: Diagnosis not present

## 2015-07-15 DIAGNOSIS — Z510 Encounter for antineoplastic radiation therapy: Secondary | ICD-10-CM | POA: Diagnosis not present

## 2015-07-15 DIAGNOSIS — E785 Hyperlipidemia, unspecified: Secondary | ICD-10-CM | POA: Diagnosis not present

## 2015-07-15 DIAGNOSIS — R9721 Rising PSA following treatment for malignant neoplasm of prostate: Secondary | ICD-10-CM | POA: Diagnosis not present

## 2015-07-15 DIAGNOSIS — Z9889 Other specified postprocedural states: Secondary | ICD-10-CM | POA: Diagnosis not present

## 2015-07-15 DIAGNOSIS — C61 Malignant neoplasm of prostate: Secondary | ICD-10-CM | POA: Diagnosis not present

## 2015-07-15 DIAGNOSIS — M109 Gout, unspecified: Secondary | ICD-10-CM | POA: Diagnosis not present

## 2015-07-16 ENCOUNTER — Ambulatory Visit
Admission: RE | Admit: 2015-07-16 | Discharge: 2015-07-16 | Disposition: A | Discharge status: Home / Self Care | Payer: Medicare Other | Source: Ambulatory Visit | Attending: Radiation Oncology | Admitting: Radiation Oncology

## 2015-07-16 ENCOUNTER — Ambulatory Visit: Payer: Medicare Other

## 2015-07-16 DIAGNOSIS — E785 Hyperlipidemia, unspecified: Secondary | ICD-10-CM | POA: Diagnosis not present

## 2015-07-16 DIAGNOSIS — E119 Type 2 diabetes mellitus without complications: Secondary | ICD-10-CM | POA: Diagnosis not present

## 2015-07-16 DIAGNOSIS — R9721 Rising PSA following treatment for malignant neoplasm of prostate: Secondary | ICD-10-CM | POA: Diagnosis not present

## 2015-07-16 DIAGNOSIS — M109 Gout, unspecified: Secondary | ICD-10-CM | POA: Diagnosis not present

## 2015-07-16 DIAGNOSIS — Z510 Encounter for antineoplastic radiation therapy: Secondary | ICD-10-CM | POA: Diagnosis not present

## 2015-07-16 DIAGNOSIS — C61 Malignant neoplasm of prostate: Secondary | ICD-10-CM | POA: Diagnosis not present

## 2015-07-16 DIAGNOSIS — Z9889 Other specified postprocedural states: Secondary | ICD-10-CM | POA: Diagnosis not present

## 2015-07-16 DIAGNOSIS — Z7982 Long term (current) use of aspirin: Secondary | ICD-10-CM | POA: Diagnosis not present

## 2015-07-17 ENCOUNTER — Ambulatory Visit
Admission: RE | Admit: 2015-07-17 | Discharge: 2015-07-17 | Disposition: A | Discharge status: Home / Self Care | Payer: Medicare Other | Source: Ambulatory Visit | Attending: Radiation Oncology | Admitting: Radiation Oncology

## 2015-07-17 ENCOUNTER — Ambulatory Visit: Payer: Medicare Other

## 2015-07-17 DIAGNOSIS — C61 Malignant neoplasm of prostate: Secondary | ICD-10-CM | POA: Diagnosis not present

## 2015-07-17 DIAGNOSIS — E119 Type 2 diabetes mellitus without complications: Secondary | ICD-10-CM | POA: Diagnosis not present

## 2015-07-17 DIAGNOSIS — Z510 Encounter for antineoplastic radiation therapy: Secondary | ICD-10-CM | POA: Diagnosis not present

## 2015-07-17 DIAGNOSIS — Z7982 Long term (current) use of aspirin: Secondary | ICD-10-CM | POA: Diagnosis not present

## 2015-07-17 DIAGNOSIS — Z9889 Other specified postprocedural states: Secondary | ICD-10-CM | POA: Diagnosis not present

## 2015-07-17 DIAGNOSIS — E785 Hyperlipidemia, unspecified: Secondary | ICD-10-CM | POA: Diagnosis not present

## 2015-07-17 DIAGNOSIS — M109 Gout, unspecified: Secondary | ICD-10-CM | POA: Diagnosis not present

## 2015-07-17 DIAGNOSIS — R9721 Rising PSA following treatment for malignant neoplasm of prostate: Secondary | ICD-10-CM | POA: Diagnosis not present

## 2015-07-18 ENCOUNTER — Ambulatory Visit
Admission: RE | Admit: 2015-07-18 | Discharge: 2015-07-18 | Disposition: A | Discharge status: Home / Self Care | Payer: Medicare Other | Source: Ambulatory Visit | Attending: Radiation Oncology | Admitting: Radiation Oncology

## 2015-07-18 ENCOUNTER — Ambulatory Visit: Payer: Medicare Other

## 2015-07-18 VITALS — BP 115/68 | HR 81 | Resp 16 | Wt 272.9 lb

## 2015-07-18 DIAGNOSIS — M109 Gout, unspecified: Secondary | ICD-10-CM | POA: Diagnosis not present

## 2015-07-18 DIAGNOSIS — Z9889 Other specified postprocedural states: Secondary | ICD-10-CM | POA: Diagnosis not present

## 2015-07-18 DIAGNOSIS — E785 Hyperlipidemia, unspecified: Secondary | ICD-10-CM | POA: Diagnosis not present

## 2015-07-18 DIAGNOSIS — C61 Malignant neoplasm of prostate: Secondary | ICD-10-CM

## 2015-07-18 DIAGNOSIS — Z7982 Long term (current) use of aspirin: Secondary | ICD-10-CM | POA: Diagnosis not present

## 2015-07-18 DIAGNOSIS — Z510 Encounter for antineoplastic radiation therapy: Secondary | ICD-10-CM | POA: Diagnosis not present

## 2015-07-18 DIAGNOSIS — E119 Type 2 diabetes mellitus without complications: Secondary | ICD-10-CM | POA: Diagnosis not present

## 2015-07-18 DIAGNOSIS — R9721 Rising PSA following treatment for malignant neoplasm of prostate: Secondary | ICD-10-CM | POA: Diagnosis not present

## 2015-07-18 NOTE — Progress Notes (Signed)
Weight and vitals stable. Denies pain. Reports nocturia x3. Denies hematuria or dysuria. Describes strong steady urine stream without difficulty emptying. Reports stress incontinence continues. Denies diarrhea. Denies fatigue. Reports he continues to exercise regularly.  BP 115/68 mmHg  Pulse 81  Resp 16  Wt 272 lb 14.4 oz (123.787 kg) Wt Readings from Last 3 Encounters:  07/18/15 272 lb 14.4 oz (123.787 kg)  07/11/15 275 lb 4.8 oz (124.875 kg)  07/04/15 276 lb 8 oz (125.42 kg)

## 2015-07-19 ENCOUNTER — Ambulatory Visit: Payer: Medicare Other

## 2015-07-19 ENCOUNTER — Ambulatory Visit
Admission: RE | Admit: 2015-07-19 | Discharge: 2015-07-19 | Disposition: A | Discharge status: Home / Self Care | Payer: Medicare Other | Source: Ambulatory Visit | Attending: Radiation Oncology | Admitting: Radiation Oncology

## 2015-07-19 DIAGNOSIS — Z510 Encounter for antineoplastic radiation therapy: Secondary | ICD-10-CM | POA: Diagnosis not present

## 2015-07-19 DIAGNOSIS — Z7982 Long term (current) use of aspirin: Secondary | ICD-10-CM | POA: Diagnosis not present

## 2015-07-19 DIAGNOSIS — Z9889 Other specified postprocedural states: Secondary | ICD-10-CM | POA: Diagnosis not present

## 2015-07-19 DIAGNOSIS — E119 Type 2 diabetes mellitus without complications: Secondary | ICD-10-CM | POA: Diagnosis not present

## 2015-07-19 DIAGNOSIS — E785 Hyperlipidemia, unspecified: Secondary | ICD-10-CM | POA: Diagnosis not present

## 2015-07-19 DIAGNOSIS — M109 Gout, unspecified: Secondary | ICD-10-CM | POA: Diagnosis not present

## 2015-07-19 DIAGNOSIS — C61 Malignant neoplasm of prostate: Secondary | ICD-10-CM | POA: Diagnosis not present

## 2015-07-19 DIAGNOSIS — R9721 Rising PSA following treatment for malignant neoplasm of prostate: Secondary | ICD-10-CM | POA: Diagnosis not present

## 2015-07-22 ENCOUNTER — Ambulatory Visit: Payer: Medicare Other

## 2015-07-22 ENCOUNTER — Ambulatory Visit
Admission: RE | Admit: 2015-07-22 | Discharge: 2015-07-22 | Disposition: A | Discharge status: Home / Self Care | Payer: Medicare Other | Source: Ambulatory Visit | Attending: Radiation Oncology | Admitting: Radiation Oncology

## 2015-07-22 DIAGNOSIS — E785 Hyperlipidemia, unspecified: Secondary | ICD-10-CM | POA: Diagnosis not present

## 2015-07-22 DIAGNOSIS — Z7982 Long term (current) use of aspirin: Secondary | ICD-10-CM | POA: Diagnosis not present

## 2015-07-22 DIAGNOSIS — Z9889 Other specified postprocedural states: Secondary | ICD-10-CM | POA: Diagnosis not present

## 2015-07-22 DIAGNOSIS — R9721 Rising PSA following treatment for malignant neoplasm of prostate: Secondary | ICD-10-CM | POA: Diagnosis not present

## 2015-07-22 DIAGNOSIS — M109 Gout, unspecified: Secondary | ICD-10-CM | POA: Diagnosis not present

## 2015-07-22 DIAGNOSIS — Z510 Encounter for antineoplastic radiation therapy: Secondary | ICD-10-CM | POA: Diagnosis not present

## 2015-07-22 DIAGNOSIS — E119 Type 2 diabetes mellitus without complications: Secondary | ICD-10-CM | POA: Diagnosis not present

## 2015-07-22 DIAGNOSIS — C61 Malignant neoplasm of prostate: Secondary | ICD-10-CM | POA: Diagnosis not present

## 2015-07-23 ENCOUNTER — Ambulatory Visit: Payer: Medicare Other

## 2015-07-23 ENCOUNTER — Ambulatory Visit
Admission: RE | Admit: 2015-07-23 | Discharge: 2015-07-23 | Disposition: A | Discharge status: Home / Self Care | Payer: Medicare Other | Source: Ambulatory Visit | Attending: Radiation Oncology | Admitting: Radiation Oncology

## 2015-07-23 DIAGNOSIS — E119 Type 2 diabetes mellitus without complications: Secondary | ICD-10-CM | POA: Diagnosis not present

## 2015-07-23 DIAGNOSIS — E785 Hyperlipidemia, unspecified: Secondary | ICD-10-CM | POA: Diagnosis not present

## 2015-07-23 DIAGNOSIS — Z7982 Long term (current) use of aspirin: Secondary | ICD-10-CM | POA: Diagnosis not present

## 2015-07-23 DIAGNOSIS — Z9889 Other specified postprocedural states: Secondary | ICD-10-CM | POA: Diagnosis not present

## 2015-07-23 DIAGNOSIS — M109 Gout, unspecified: Secondary | ICD-10-CM | POA: Diagnosis not present

## 2015-07-23 DIAGNOSIS — C61 Malignant neoplasm of prostate: Secondary | ICD-10-CM | POA: Diagnosis not present

## 2015-07-23 DIAGNOSIS — R9721 Rising PSA following treatment for malignant neoplasm of prostate: Secondary | ICD-10-CM | POA: Diagnosis not present

## 2015-07-23 DIAGNOSIS — Z510 Encounter for antineoplastic radiation therapy: Secondary | ICD-10-CM | POA: Diagnosis not present

## 2015-07-24 ENCOUNTER — Ambulatory Visit: Payer: Medicare Other

## 2015-07-24 ENCOUNTER — Ambulatory Visit
Admission: RE | Admit: 2015-07-24 | Discharge: 2015-07-24 | Disposition: A | Discharge status: Home / Self Care | Payer: Medicare Other | Source: Ambulatory Visit | Attending: Radiation Oncology | Admitting: Radiation Oncology

## 2015-07-24 DIAGNOSIS — Z510 Encounter for antineoplastic radiation therapy: Secondary | ICD-10-CM | POA: Diagnosis not present

## 2015-07-24 DIAGNOSIS — C61 Malignant neoplasm of prostate: Secondary | ICD-10-CM | POA: Diagnosis not present

## 2015-07-25 ENCOUNTER — Ambulatory Visit: Payer: Medicare Other

## 2015-07-25 ENCOUNTER — Ambulatory Visit
Admission: RE | Admit: 2015-07-25 | Discharge: 2015-07-25 | Disposition: A | Discharge status: Home / Self Care | Payer: Medicare Other | Source: Ambulatory Visit | Attending: Radiation Oncology | Admitting: Radiation Oncology

## 2015-07-25 VITALS — BP 115/61 | HR 75 | Resp 16 | Wt 272.1 lb

## 2015-07-25 DIAGNOSIS — C61 Malignant neoplasm of prostate: Secondary | ICD-10-CM

## 2015-07-25 DIAGNOSIS — Z510 Encounter for antineoplastic radiation therapy: Secondary | ICD-10-CM | POA: Diagnosis not present

## 2015-07-25 NOTE — Progress Notes (Signed)
  Radiation Oncology         [phone]   Name: Curtis Hunt MRN: [id number]   Date: 07/25/2015  DOB: 1950/07/26     Weekly Radiation Therapy Management   Current Dose: 59.4 Gy  Planned Dose:  68.4 Gy  Narrative The patient presents for routine under treatment assessment for fraction number 33/38.  Weight and vitals stable. Denies pain. Reports nocturia x3. Denies hematuria or dysuria. Describes strong steady urine stream without difficulty emptying. Reports stress incontinence continues. Denies diarrhea. Denies fatigue. Reports he continues to exercise regularly.  Set-up films were reviewed. The chart was checked.  Physical Findings  weight is 272 lb 1.6 oz (123.424 kg). His blood pressure is 115/61 and his pulse is 75. His respiration is 16 and oxygen saturation is 100%.   Pain Scale 0/10 In general this is a well-appearing African-American male in no acute distress. He is alert and oriented 4 and appropriate throughout the examination. Cardiopulmonary assessment is negative for acute distress and reveals normal effort.   Impression The patient is tolerating radiation.  Plan Continue treatment as planned. His symptoms seem to be well controlled, and we will continue to follow this closely during the course of his treatment.     Sheral Apley Tammi Klippel, M.D.  This document serves as a record of services personally performed by Tyler Pita, MD. It was created on his behalf by Darcus Austin, a trained medical scribe. The creation of this record is based on the scribe's personal observations and the provider's statements to them. This document has been checked and approved by the attending provider.

## 2015-07-25 NOTE — Progress Notes (Signed)
Weight and vitals stable. Denies pain. Reports nocturia x3. Denies hematuria or dysuria. Describes strong steady urine stream without difficulty emptying. Reports stress incontinence continues. Denies diarrhea. Denies fatigue. Reports he continues to exercise regularly.  BP 115/61 mmHg  Pulse 75  Resp 16  Wt 272 lb 1.6 oz (123.424 kg)  SpO2 100% Wt Readings from Last 3 Encounters:  07/25/15 272 lb 1.6 oz (123.424 kg)  07/18/15 272 lb 14.4 oz (123.787 kg)  07/11/15 275 lb 4.8 oz (124.875 kg)

## 2015-07-26 ENCOUNTER — Ambulatory Visit
Admission: RE | Admit: 2015-07-26 | Discharge: 2015-07-26 | Disposition: A | Discharge status: Home / Self Care | Payer: Medicare Other | Source: Ambulatory Visit | Attending: Radiation Oncology | Admitting: Radiation Oncology

## 2015-07-26 ENCOUNTER — Ambulatory Visit: Payer: Medicare Other

## 2015-07-26 DIAGNOSIS — Z510 Encounter for antineoplastic radiation therapy: Secondary | ICD-10-CM | POA: Diagnosis not present

## 2015-07-26 DIAGNOSIS — C61 Malignant neoplasm of prostate: Secondary | ICD-10-CM | POA: Diagnosis not present

## 2015-07-29 ENCOUNTER — Ambulatory Visit
Admission: RE | Admit: 2015-07-29 | Discharge: 2015-07-29 | Disposition: A | Discharge status: Home / Self Care | Payer: Medicare Other | Source: Ambulatory Visit | Attending: Radiation Oncology | Admitting: Radiation Oncology

## 2015-07-29 ENCOUNTER — Ambulatory Visit: Payer: Medicare Other

## 2015-07-29 DIAGNOSIS — C61 Malignant neoplasm of prostate: Secondary | ICD-10-CM | POA: Diagnosis not present

## 2015-07-29 DIAGNOSIS — Z510 Encounter for antineoplastic radiation therapy: Secondary | ICD-10-CM | POA: Diagnosis not present

## 2015-07-30 ENCOUNTER — Ambulatory Visit
Admission: RE | Admit: 2015-07-30 | Discharge: 2015-07-30 | Disposition: A | Discharge status: Home / Self Care | Payer: Medicare Other | Source: Ambulatory Visit | Attending: Radiation Oncology | Admitting: Radiation Oncology

## 2015-07-30 ENCOUNTER — Ambulatory Visit: Payer: Medicare Other

## 2015-07-30 DIAGNOSIS — Z510 Encounter for antineoplastic radiation therapy: Secondary | ICD-10-CM | POA: Diagnosis not present

## 2015-07-30 DIAGNOSIS — C61 Malignant neoplasm of prostate: Secondary | ICD-10-CM | POA: Diagnosis not present

## 2015-07-31 ENCOUNTER — Ambulatory Visit
Admission: RE | Admit: 2015-07-31 | Discharge: 2015-07-31 | Disposition: A | Discharge status: Home / Self Care | Payer: Medicare Other | Source: Ambulatory Visit | Attending: Radiation Oncology | Admitting: Radiation Oncology

## 2015-07-31 ENCOUNTER — Ambulatory Visit: Payer: Medicare Other

## 2015-07-31 DIAGNOSIS — C61 Malignant neoplasm of prostate: Secondary | ICD-10-CM | POA: Diagnosis not present

## 2015-07-31 DIAGNOSIS — Z510 Encounter for antineoplastic radiation therapy: Secondary | ICD-10-CM | POA: Diagnosis not present

## 2015-08-01 ENCOUNTER — Ambulatory Visit: Payer: Medicare Other

## 2015-08-01 ENCOUNTER — Encounter: Payer: Self-pay | Admitting: Radiation Oncology

## 2015-08-01 ENCOUNTER — Ambulatory Visit
Admission: RE | Admit: 2015-08-01 | Discharge: 2015-08-01 | Disposition: A | Discharge status: Home / Self Care | Payer: Medicare Other | Source: Ambulatory Visit | Attending: Radiation Oncology | Admitting: Radiation Oncology

## 2015-08-01 VITALS — BP 123/69 | HR 20 | Temp 97.0°F | Resp 18 | Wt 270.8 lb

## 2015-08-01 DIAGNOSIS — C61 Malignant neoplasm of prostate: Secondary | ICD-10-CM

## 2015-08-01 DIAGNOSIS — Z510 Encounter for antineoplastic radiation therapy: Secondary | ICD-10-CM | POA: Diagnosis not present

## 2015-08-01 NOTE — Progress Notes (Signed)
  Radiation Oncology         [phone]   Name: Curtis Hunt MRN: [id number]   Date: 08/01/2015  DOB: [birth year]     Weekly Radiation Therapy Management   Current Dose: 68.4 Gy  Planned Dose:  68.4 Gy  Narrative The patient presents for routine under treatment assessment.  Weekly radiation treatments prostate 38/38 completed. Good stream. Nocturia 3-4x. Dysuria at times as first starts to void, very mild. Bowels okay. Appetite good. F/u apt 09/03/15 with Bryson Ha at 11:30am.  Set-up films were reviewed. The chart was checked.  Physical Findings  weight is 270 lb 12.8 oz (122.834 kg). His oral temperature is 97 F (36.1 C). His blood pressure is 123/69 and his pulse is 20. His respiration is 18.    In general this is a well-appearing African-American male in no acute distress. He is alert and oriented 4 and appropriate throughout the examination.   Impression The patient is tolerating radiation.  Plan Continue treatment as planned. The patient was given a one month follow up appointment card.    Sheral Apley Tammi Klippel, M.D.  This document serves as a record of services personally performed by Tyler Pita, MD. It was created on his behalf by Arlyce Harman, a trained medical scribe. The creation of this record is based on the scribe's personal observations and the provider's statements to them. This document has been checked and approved by the attending provider.

## 2015-08-01 NOTE — Progress Notes (Signed)
Weekly rd txs prostate 38/38 completed, good stream, nocturia 3-4x , dysuria at times as first starts to void, very mild, bowels okay, appetite good, f/u apt 09/03/15 with alsion at 1130am 11:05 AM BP 123/69 mmHg  Pulse 20  Temp(Src) 97 F (36.1 C) (Oral)  Resp 18  Wt 270 lb 12.8 oz (122.834 kg)  . Wt Readings from Last 3 Encounters:  08/01/15 270 lb 12.8 oz (122.834 kg)  07/25/15 272 lb 1.6 oz (123.424 kg)  07/18/15 272 lb 14.4 oz (123.787 kg)

## 2015-08-02 ENCOUNTER — Ambulatory Visit: Payer: Medicare Other

## 2015-08-05 ENCOUNTER — Ambulatory Visit: Payer: Medicare Other

## 2015-08-12 ENCOUNTER — Other Ambulatory Visit: Payer: Self-pay | Admitting: Family Medicine

## 2015-08-12 NOTE — Telephone Encounter (Signed)
Refill appropriate and filled per protocol. 

## 2015-08-20 ENCOUNTER — Encounter: Payer: Medicare Other | Admitting: Family Medicine

## 2015-08-23 NOTE — Progress Notes (Addendum)
  Radiation Oncology         [phone] ________________________________  Name: Curtis Hunt MRN: [id number]  Date: 08/01/2015  DOB: Jan 23, 1951  End of Treatment Note ICD-9-CM ICD-10-CM     1. Malignant neoplasm of prostate ([place]) 185 C61     DIAGNOSIS: 65 y.o. gentleman with a rising detectable PSA of 0.20 s/p prostatectomy (06/26/03) for stage T2c adenocarcinoma of the prostate with a Gleason's score of 3+4.      Indication for treatment:  Curative, Prostate Radiotherapy       Radiation treatment dates:   06/11/2015-08/01/2015  Site/dose:   The prostatic fossa was treated to 68.4 Gy in 38 fractions of 1.8 Gy  Beams/energy:   The prostatic fossa was treated using helical intensity modulated radiotherapy delivering 6 megavolt photons. Image guidance was performed with megavoltage CT studies prior to each fraction. He was immobilized with a body fix lower extremity mold.  Narrative: The patient tolerated radiation treatment relatively well.     Plan: The patient has completed radiation treatment. He will return to radiation oncology clinic for routine followup in one month. I advised him to call or return sooner if he has any questions or concerns related to his recovery or treatment. ________________________________  Sheral Apley. Tammi Klippel, M.D.   This document serves as a record of services personally performed by Tyler Pita, MD. It was created on his behalf by Arlyce Harman, a trained medical scribe. The creation of this record is based on the scribe's personal observations and the provider's statements to them. This document has been checked and approved by the attending provider.

## 2015-09-02 ENCOUNTER — Other Ambulatory Visit: Payer: Medicare Other

## 2015-09-02 DIAGNOSIS — E785 Hyperlipidemia, unspecified: Secondary | ICD-10-CM | POA: Diagnosis not present

## 2015-09-02 DIAGNOSIS — M6282 Rhabdomyolysis: Secondary | ICD-10-CM | POA: Diagnosis not present

## 2015-09-02 DIAGNOSIS — Z79899 Other long term (current) drug therapy: Secondary | ICD-10-CM

## 2015-09-02 DIAGNOSIS — E1165 Type 2 diabetes mellitus with hyperglycemia: Secondary | ICD-10-CM | POA: Diagnosis not present

## 2015-09-02 DIAGNOSIS — N179 Acute kidney failure, unspecified: Secondary | ICD-10-CM

## 2015-09-02 DIAGNOSIS — E1122 Type 2 diabetes mellitus with diabetic chronic kidney disease: Secondary | ICD-10-CM

## 2015-09-03 ENCOUNTER — Telehealth: Payer: Self-pay | Admitting: *Deleted

## 2015-09-03 ENCOUNTER — Ambulatory Visit (INDEPENDENT_AMBULATORY_CARE_PROVIDER_SITE_OTHER): Payer: Medicare Other | Admitting: *Deleted

## 2015-09-03 ENCOUNTER — Ambulatory Visit
Admission: RE | Admit: 2015-09-03 | Discharge: 2015-09-03 | Disposition: A | Discharge status: Home / Self Care | Payer: Medicare Other | Source: Ambulatory Visit | Attending: Radiation Oncology | Admitting: Radiation Oncology

## 2015-09-03 ENCOUNTER — Encounter: Payer: Self-pay | Admitting: Radiation Oncology

## 2015-09-03 VITALS — BP 114/63 | HR 73 | Temp 98.2°F | Ht 73.0 in | Wt 269.8 lb

## 2015-09-03 DIAGNOSIS — C61 Malignant neoplasm of prostate: Secondary | ICD-10-CM

## 2015-09-03 DIAGNOSIS — Z111 Encounter for screening for respiratory tuberculosis: Secondary | ICD-10-CM | POA: Diagnosis not present

## 2015-09-03 LAB — LIPID PANEL
CHOL/HDL RATIO: 3.3 ratio (ref <=5.0)
Cholesterol: 143 mg/dL (ref 125–200)
HDL: 44 mg/dL (ref >=40)
LDL Cholesterol: 67 mg/dL (ref <130)
TRIGLYCERIDES: 161 mg/dL — AB (ref <150)
VLDL: 32 mg/dL — ABNORMAL HIGH (ref <30)

## 2015-09-03 LAB — COMPLETE METABOLIC PANEL WITH GFR
ALBUMIN: 3.9 g/dL (ref 3.6–5.1)
ALK PHOS: 55 U/L (ref 40–115)
ALT: 10 U/L (ref 9–46)
AST: 14 U/L (ref 10–35)
BILIRUBIN TOTAL: 0.3 mg/dL (ref 0.2–1.2)
BUN: 27 mg/dL — ABNORMAL HIGH (ref 7–25)
CO2: 25 mmol/L (ref 20–31)
Calcium: 9.1 mg/dL (ref 8.6–10.3)
Chloride: 103 mmol/L (ref 98–110)
Creat: 1.61 mg/dL — ABNORMAL HIGH (ref 0.70–1.25)
GFR, EST NON AFRICAN AMERICAN: 44 mL/min — AB (ref >=60)
GFR, Est African American: 51 mL/min — ABNORMAL LOW (ref >=60)
GLUCOSE: 97 mg/dL (ref 70–99)
POTASSIUM: 4.3 mmol/L (ref 3.5–5.3)
SODIUM: 138 mmol/L (ref 135–146)
Total Protein: 7.5 g/dL (ref 6.1–8.1)

## 2015-09-03 LAB — CBC WITH DIFFERENTIAL/PLATELET
BASOS ABS: 50 {cells}/uL (ref 0–200)
Basophils Relative: 1 %
EOS ABS: 100 {cells}/uL (ref 15–500)
Eosinophils Relative: 2 %
HEMATOCRIT: 38.1 % — AB (ref 38.5–50.0)
HEMOGLOBIN: 12.2 g/dL — AB (ref 13.0–17.0)
LYMPHS ABS: 1550 {cells}/uL (ref 850–3900)
LYMPHS PCT: 31 %
MCH: 28 pg (ref 27.0–33.0)
MCHC: 32 g/dL (ref 32.0–36.0)
MCV: 87.6 fL (ref 80.0–100.0)
MONO ABS: 500 {cells}/uL (ref 200–950)
MPV: 8.9 fL (ref 7.5–12.5)
Monocytes Relative: 10 %
NEUTROS PCT: 56 %
Neutro Abs: 2800 cells/uL (ref 1500–7800)
Platelets: 261 10*3/uL (ref 140–400)
RBC: 4.35 MIL/uL (ref 4.20–5.80)
RDW: 16.5 % — AB (ref 11.0–15.0)
WBC: 5 10*3/uL (ref 3.8–10.8)

## 2015-09-03 LAB — HEMOGLOBIN A1C
Hgb A1c MFr Bld: 7 % — ABNORMAL HIGH (ref <5.7)
MEAN PLASMA GLUCOSE: 154 mg/dL

## 2015-09-03 NOTE — Progress Notes (Signed)
Mr. Gasbarro here for reassessment s/p XRT for prostate cancer.  He reports "good" urinary stream with complete emptying. No dribbling.  Nocturia x 3-4 times.   Denies any rectal irritation and reports "normal" BM.  No change in fatigue from end of treat and is not as active as in the past.

## 2015-09-03 NOTE — Telephone Encounter (Signed)
Called patient to inform of fu with Dr. Diona Fanti on May 26- arrival time - 9:45 am, spoke with patient and he is aware of this appt.

## 2015-09-03 NOTE — Progress Notes (Signed)
Radiation Oncology         [phone] ________________________________  Name: Curtis Hunt MRN: [id number]  Date: 09/03/2015  DOB: [birth year]  Follow-Up Visit Note  CC: Odette Fraction, MD  Franchot Gallo, MD  Diagnosis:   Adenocarcinoma of the prostate with rising detectable PSA of 0.20 s/p prostatectomy (06/26/03) for stage T2c adenocarcinoma of the prostate with a Gleason's score of 3+4.    ICD-9-CM ICD-10-CM   1. Malignant neoplasm of prostate ([facility]) 185 C61     Interval Since Last Radiation:  4  weeks  06/11/2015-08/01/2015: The prosthetic fossa was treated to 68.4 Gy in 38 fractions  Narrative:  The patient returns today for routine follow-up since completing radiotherapy. During the course of treatment, the patient did experience multiple episodes of nocturia, and fatigue.                             On review of systems, the patient reports that his fatigue is slowly improving. He states that he continues to wake up at night to urinate but this is approximately 3-4 times. On occasion he does experience some urinary incontinence and wears a pad. He changes this once daily. He reports normal bowel activity, and states that his urinary stream has become stronger, he denies any dysuria hematuria, hesitancy urgency. He is not experiencing any shortness of breath or chest pain, fevers, chills. No other complaints or verbalized.  ALLERGIES:  has No Known Allergies.  Meds: Current Outpatient Prescriptions  Medication Sig Dispense Refill  . allopurinol (ZYLOPRIM) 300 MG tablet Take 1 tablet (300 mg total) by mouth daily. 90 tablet 3  . aspirin 81 MG tablet Take 81 mg by mouth daily.    . B-D UF III MINI PEN NEEDLES 31G X 5 MM MISC USE TWICE A DAY....250.00 100 each 11  . Blood Glucose Monitoring Suppl (ONE TOUCH ULTRA 2) W/DEVICE KIT As driected 1 each 0  . glucose blood (ONE TOUCH ULTRA TEST) test strip CHECK SUGAR 4-5 TIMES A DAY.EVERY MORNING AFTER MEALS AND AT BEDTIME 200  each 8  . Insulin Detemir (LEVEMIR FLEXTOUCH) 100 UNIT/ML Pen INJECT 65 UNITS EVERY AM WITH BREAKFAST... + INJECT 60 UNITS INTO THE SKIN DAILY AT 10 PM. (Patient taking differently: Inject 30 Units into the skin daily at 10 pm. ) 30 pen 5  . Insulin Pen Needle 31G X 6 MM MISC Use with Levemir Flextouch Pen 100 each 0  . INVOKANA 300 MG TABS tablet TAKE 1 TABLET BY MOUTH EVERY DAY BEFORE BREAKFAST 30 tablet 5  . NOVOLOG FLEXPEN 100 UNIT/ML FlexPen INJECT 16 UNITS INTO THE SKIN 3 (THREE) TIMES DAILY WITH MEALS. (Patient taking differently: INJECT 8 UNITS INTO THE SKIN 3 (THREE) TIMES DAILY WITH MEALS.as needed) 15 pen 4  . rosuvastatin (CRESTOR) 40 MG tablet TAKE 1 TABLET BY MOUTH EVERY DAY 90 tablet 3  . sildenafil (VIAGRA) 100 MG tablet Take 0.5-1 tablets (50-100 mg total) by mouth daily as needed for erectile dysfunction. 5 tablet 11  . valsartan (DIOVAN) 160 MG tablet TAKE 1 TABLET (160 MG TOTAL) BY MOUTH DAILY. 30 tablet 11  . esomeprazole (NEXIUM) 40 MG capsule TAKE ONE CAPSULE BY MOUTH EVERY DAY (Patient not taking: Reported on 09/03/2015) 30 capsule 11  . insulin aspart (NOVOLOG) 100 UNIT/ML injection Inject 16 Units into the skin 3 (three) times daily with meals. 10 mL 11  . omeprazole (PRILOSEC) 20 MG capsule TAKE 1 CAPSULE  BY MOUTH ONCE A DAY 30 capsule 11   No current facility-administered medications for this encounter.    Physical Findings: The patient is in no acute distress. Patient is alert and oriented.  height is 6' 1"  (1.854 m) and weight is 269 lb 12.8 oz (122.38 kg). His temperature is 98.2 F (36.8 C). His blood pressure is 114/63 and his pulse is 73. .  No significant changes.  Lab Findings: Lab Results  Component Value Date   WBC 5.0 09/02/2015   HGB 12.2* 09/02/2015   HCT 38.1* 09/02/2015   PLT 261 09/02/2015    Lab Results  Component Value Date   NA 138 09/02/2015   K 4.3 09/02/2015   CO2 25 09/02/2015   GLUCOSE 97 09/02/2015   BUN 27* 09/02/2015    CREATININE 1.61* 09/02/2015   CREATININE 1.18 06/15/2013   BILITOT 0.3 09/02/2015   ALKPHOS 55 09/02/2015   AST 14 09/02/2015   ALT 10 09/02/2015   PROT 7.5 09/02/2015   ALBUMIN 3.9 09/02/2015   CALCIUM 9.1 09/02/2015    Radiographic Findings: No results found.  Impression/Plan:  1. Rising detectable PSA of 0.20 s/p prostatectomy (06/26/03) for stage T2c adenocarcinoma of the prostate with a Gleason's score of 3+4. The patient appears to be doing extremely well since recovering from radiotherapy to the prostatic fossa. We discussed the rationale for following up with Dr. Vernie Shanks in the next 2 month's time. The patient states agreement and understanding, and we will contact Dr. Vernie Shanks office to get in touch with the patient regarding follow-up appointments and PSA checks. He is encouraged to keep [place] informed of he has any questions or concerns regarding his previous radiotherapy, and we would be happy to see him in the future if he had any additional need for radiation. 2. Survivorship. I've offered a referral to our survivorship clinic [place]. At this point in time the patient would like to meet back with Dr. Vernie Shanks before deciding if he wants to move forward with this type of evaluation. He is encouraged to contact me and is given my card indicates he does want me to refer him for this.     Carola Rhine, PAC

## 2015-09-05 ENCOUNTER — Encounter: Payer: Self-pay | Admitting: Family Medicine

## 2015-09-05 ENCOUNTER — Ambulatory Visit (INDEPENDENT_AMBULATORY_CARE_PROVIDER_SITE_OTHER): Payer: Medicare Other | Admitting: Family Medicine

## 2015-09-05 VITALS — BP 120/74 | HR 80 | Temp 98.2°F | Resp 16 | Ht 73.0 in | Wt 269.0 lb

## 2015-09-05 DIAGNOSIS — Z794 Long term (current) use of insulin: Secondary | ICD-10-CM

## 2015-09-05 DIAGNOSIS — I10 Essential (primary) hypertension: Secondary | ICD-10-CM

## 2015-09-05 DIAGNOSIS — E785 Hyperlipidemia, unspecified: Secondary | ICD-10-CM | POA: Diagnosis not present

## 2015-09-05 DIAGNOSIS — N183 Chronic kidney disease, stage 3 unspecified: Secondary | ICD-10-CM

## 2015-09-05 DIAGNOSIS — Z0000 Encounter for general adult medical examination without abnormal findings: Secondary | ICD-10-CM | POA: Diagnosis not present

## 2015-09-05 DIAGNOSIS — E119 Type 2 diabetes mellitus without complications: Secondary | ICD-10-CM

## 2015-09-05 LAB — TB SKIN TEST: TB SKIN TEST: NEGATIVE

## 2015-09-05 NOTE — Progress Notes (Signed)
Subjective:    Patient ID: Curtis Hunt, male    DOB: 1951-04-08, 65 y.o.   MRN: [id number]  HPI Patient is here today for complete physical exam. Has insulin-dependent diabetes mellitus. Currently managed on 30 units of Levemir along with Invokana. Creatinine has recently risen from 1.41.6. Patient missed that he is not drinking enough fluid. He also has not been exercising regularly. Hemoglobin A1c has climbed from 6.5-7. He denies any hypoglycemic episodes. He denies any polyuria, polydipsia, or blurred vision. She denies any neuropathy in his feet. Prevnar 13 is due. Pneumovax 23 is up-to-date. Shingles vaccine is up-to-date. Flu shot is up-to-date. Unfortunately the patient's PSA recently has risen. He underwent radiation therapy. He is follow-up with his urologist in one month. Colonoscopy is up-to-date. He declines hepatitis C screening. Diabetic eye exam is performed annually Clinical Support on 09/03/2015  Component Date Value Ref Range Status  . TB Skin Test 09/05/2015 Negative   Final  . Induration 09/05/2015 o   Final  Lab on 09/02/2015  Component Date Value Ref Range Status  . Sodium 09/02/2015 138  135 - 146 mmol/L Final  . Potassium 09/02/2015 4.3  3.5 - 5.3 mmol/L Final  . Chloride 09/02/2015 103  98 - 110 mmol/L Final  . CO2 09/02/2015 25  20 - 31 mmol/L Final  . Glucose, Bld 09/02/2015 97  70 - 99 mg/dL Final  . BUN 09/02/2015 27* 7 - 25 mg/dL Final  . Creat 09/02/2015 1.61* 0.70 - 1.25 mg/dL Final  . Total Bilirubin 09/02/2015 0.3  0.2 - 1.2 mg/dL Final  . Alkaline Phosphatase 09/02/2015 55  40 - 115 U/L Final  . AST 09/02/2015 14  10 - 35 U/L Final  . ALT 09/02/2015 10  9 - 46 U/L Final  . Total Protein 09/02/2015 7.5  6.1 - 8.1 g/dL Final  . Albumin 09/02/2015 3.9  3.6 - 5.1 g/dL Final  . Calcium 09/02/2015 9.1  8.6 - 10.3 mg/dL Final  . GFR, Est African American 09/02/2015 51* >=60 mL/min Final  . GFR, Est Non African American 09/02/2015 44* >=60 mL/min Final   Comment:   The estimated GFR is a calculation valid for adults (>=3 years old) that uses the CKD-EPI algorithm to adjust for age and sex. It is   not to be used for children, pregnant women, hospitalized patients,    patients on dialysis, or with rapidly changing kidney function. According to the NKDEP, eGFR >89 is normal, 60-89 shows mild impairment, 30-59 shows moderate impairment, 15-29 shows severe impairment and <15 is ESRD.     Marland Kitchen Cholesterol 09/02/2015 143  125 - 200 mg/dL Final  . Triglycerides 09/02/2015 161* <150 mg/dL Final  . HDL 09/02/2015 44  >=40 mg/dL Final  . Total CHOL/HDL Ratio 09/02/2015 3.3  <=5.0 Ratio Final  . VLDL 09/02/2015 32* <30 mg/dL Final  . LDL Cholesterol 09/02/2015 67  <130 mg/dL Final   Comment:   Total Cholesterol/HDL Ratio:CHD Risk                        Coronary Heart Disease Risk Table                                        Men       Women          1/2 Average Risk  3.4        3.3              Average Risk              5.0        4.4           2X Average Risk              9.6        7.1           3X Average Risk             23.4       11.0 Use the calculated Patient Ratio above and the CHD Risk table  to determine the patient's CHD Risk.   . WBC 09/02/2015 5.0  3.8 - 10.8 K/uL Final  . RBC 09/02/2015 4.35  4.20 - 5.80 MIL/uL Final  . Hemoglobin 09/02/2015 12.2* 13.0 - 17.0 g/dL Final  . HCT 09/02/2015 38.1* 38.5 - 50.0 % Final  . MCV 09/02/2015 87.6  80.0 - 100.0 fL Final  . MCH 09/02/2015 28.0  27.0 - 33.0 pg Final  . MCHC 09/02/2015 32.0  32.0 - 36.0 g/dL Final  . RDW 09/02/2015 16.5* 11.0 - 15.0 % Final  . Platelets 09/02/2015 261  140 - 400 K/uL Final  . MPV 09/02/2015 8.9  7.5 - 12.5 fL Final  . Neutro Abs 09/02/2015 2800  1500 - 7800 cells/uL Final  . Lymphs Abs 09/02/2015 1550  850 - 3900 cells/uL Final  . Monocytes Absolute 09/02/2015 500  200 - 950 cells/uL Final  . Eosinophils Absolute 09/02/2015 100  15 - 500  cells/uL Final  . Basophils Absolute 09/02/2015 50  0 - 200 cells/uL Final  . Neutrophils Relative % 09/02/2015 56   Final  . Lymphocytes Relative 09/02/2015 31   Final  . Monocytes Relative 09/02/2015 10   Final  . Eosinophils Relative 09/02/2015 2   Final  . Basophils Relative 09/02/2015 1   Final  . Smear Review 09/02/2015 Criteria for review not met   Final   ** Please note change in unit of measure and reference range(s). **  . Hgb A1c MFr Bld 09/02/2015 7.0* <5.7 % Final   Comment:   For someone without known diabetes, a hemoglobin A1c value of 6.5% or greater indicates that they may have diabetes and this should be confirmed with a follow-up test.   For someone with known diabetes, a value <7% indicates that their diabetes is well controlled and a value greater than or equal to 7% indicates suboptimal control. A1c targets should be individualized based on duration of diabetes, age, comorbid conditions, and other considerations.   Currently, no consensus exists for use of hemoglobin A1c for diagnosis of diabetes for children.     . Mean Plasma Glucose 09/02/2015 154   Final   Past Medical History  Diagnosis Date  . Hyperlipidemia   . Gout   . Cancer [facility] 2004    Prostate CA with surgery  . Diabetes mellitus without complication ([place])   . Prostate cancer [facility])    Past Surgical History  Procedure Laterality Date  . Prostate surgery     Current Outpatient Prescriptions on File Prior to Visit  Medication Sig Dispense Refill  . allopurinol (ZYLOPRIM) 300 MG tablet Take 1 tablet (300 mg total) by mouth daily. 90 tablet 3  . aspirin 81 MG tablet Take 81 mg by mouth daily.    Marland Kitchen  B-D UF III MINI PEN NEEDLES 31G X 5 MM MISC USE TWICE A DAY....250.00 100 each 11  . Blood Glucose Monitoring Suppl (ONE TOUCH ULTRA 2) W/DEVICE KIT As driected 1 each 0  . esomeprazole (NEXIUM) 40 MG capsule TAKE ONE CAPSULE BY MOUTH EVERY DAY 30 capsule 11  . glucose blood (ONE TOUCH ULTRA TEST)  test strip CHECK SUGAR 4-5 TIMES A DAY.EVERY MORNING AFTER MEALS AND AT BEDTIME 200 each 8  . Insulin Detemir (LEVEMIR FLEXTOUCH) 100 UNIT/ML Pen INJECT 65 UNITS EVERY AM WITH BREAKFAST... + INJECT 60 UNITS INTO THE SKIN DAILY AT 10 PM. (Patient taking differently: Inject 30 Units into the skin daily at 10 pm. ) 30 pen 5  . Insulin Pen Needle 31G X 6 MM MISC Use with Levemir Flextouch Pen 100 each 0  . INVOKANA 300 MG TABS tablet TAKE 1 TABLET BY MOUTH EVERY DAY BEFORE BREAKFAST 30 tablet 5  . NOVOLOG FLEXPEN 100 UNIT/ML FlexPen INJECT 16 UNITS INTO THE SKIN 3 (THREE) TIMES DAILY WITH MEALS. (Patient taking differently: INJECT 8 UNITS INTO THE SKIN 3 (THREE) TIMES DAILY WITH MEALS.as needed) 15 pen 4  . rosuvastatin (CRESTOR) 40 MG tablet TAKE 1 TABLET BY MOUTH EVERY DAY 90 tablet 3  . valsartan (DIOVAN) 160 MG tablet TAKE 1 TABLET (160 MG TOTAL) BY MOUTH DAILY. 30 tablet 11  . sildenafil (VIAGRA) 100 MG tablet Take 0.5-1 tablets (50-100 mg total) by mouth daily as needed for erectile dysfunction. (Patient not taking: Reported on 09/05/2015) 5 tablet 11   No current facility-administered medications on file prior to visit.   No Known Allergies Social History   Social History  . Marital Status: Married    Spouse Name: N/A  . Number of Children: N/A  . Years of Education: N/A   Occupational History  . Not on file.   Social History Main Topics  . Smoking status: Never Smoker   . Smokeless tobacco: Never Used  . Alcohol Use: Yes     Comment: Beer prior to diagnosis of diabetes, about once a week during sports on tv  . Drug Use: No  . Sexual Activity: Not Currently   Other Topics Concern  . Not on file   Social History Narrative   Family History  Problem Relation Age of Onset  . Cancer Mother     breast ca  . Cancer Brother     prostate      Review of Systems  All other systems reviewed and are negative.      Objective:   Physical Exam  Constitutional: He is oriented to  person, place, and time. He appears well-developed and well-nourished. No distress.  HENT:  Head: Normocephalic and atraumatic.  Right Ear: External ear normal.  Left Ear: External ear normal.  Nose: Nose normal.  Mouth/Throat: Oropharynx is clear and moist. No oropharyngeal exudate.  Eyes: Conjunctivae and EOM are normal. Pupils are equal, round, and reactive to light. Right eye exhibits no discharge. Left eye exhibits no discharge. No scleral icterus.  Neck: Normal range of motion. Neck supple. No JVD present. No tracheal deviation present. No thyromegaly present.  Cardiovascular: Normal rate, regular rhythm, normal heart sounds and intact distal pulses.  Exam reveals no gallop and no friction rub.   No murmur heard. Pulmonary/Chest: Effort normal and breath sounds normal. No stridor. No respiratory distress. He has no wheezes. He has no rales. He exhibits no tenderness.  Abdominal: Soft. Bowel sounds are normal. He exhibits no distension  and no mass. There is no tenderness. There is no rebound and no guarding.  Musculoskeletal: Normal range of motion. He exhibits no edema or tenderness.  Lymphadenopathy:    He has no cervical adenopathy.  Neurological: He is alert and oriented to person, place, and time. He has normal reflexes. He displays normal reflexes. No cranial nerve deficit. He exhibits normal muscle tone. Coordination normal.  Skin: Skin is warm. No rash noted. He is not diaphoretic. No erythema. No pallor.  Psychiatric: He has a normal mood and affect. His behavior is normal. Judgment and thought content normal.  Vitals reviewed.         Assessment & Plan:  HLD (hyperlipidemia)  Controlled type 2 diabetes mellitus without complication, with long-term current use of insulin (HCC)  CKD (chronic kidney disease), stage III  Benign essential HTN  Routine general medical examination at a health care facility  Patient's blood pressures well controlled. We discussed his  chronic kidney disease. I asked the patient to avoid any NSAIDs and to make sure he is drinking plenty of fluids. I like to recheck lab work in 3 months. Should his creatinine worsen, I will discontinue invokana and uptitrate his insulin. Recommended Prevnar 13 but the patient would like to defer that to his next office visit. Cancer screening is up-to-date. He declines hepatitis C screening. The remainder of his lab work is excellent. Diabetic eye exam and diabetic foot exam are up-to-date.

## 2015-09-21 ENCOUNTER — Other Ambulatory Visit: Payer: Self-pay | Admitting: Family Medicine

## 2015-09-22 ENCOUNTER — Encounter: Payer: Self-pay | Admitting: Family Medicine

## 2015-09-23 MED ORDER — CANAGLIFLOZIN 300 MG PO TABS: ORAL_TABLET | ORAL | Status: DC

## 2015-10-18 DIAGNOSIS — C61 Malignant neoplasm of prostate: Secondary | ICD-10-CM | POA: Diagnosis not present

## 2015-10-18 DIAGNOSIS — Z0000 Encounter for general adult medical examination without abnormal findings: Secondary | ICD-10-CM | POA: Diagnosis not present

## 2015-10-18 DIAGNOSIS — N390 Urinary tract infection, site not specified: Secondary | ICD-10-CM | POA: Diagnosis not present

## 2015-10-22 ENCOUNTER — Encounter: Payer: Self-pay | Admitting: Family Medicine

## 2015-10-22 DIAGNOSIS — H52223 Regular astigmatism, bilateral: Secondary | ICD-10-CM | POA: Diagnosis not present

## 2015-10-22 DIAGNOSIS — H43811 Vitreous degeneration, right eye: Secondary | ICD-10-CM | POA: Diagnosis not present

## 2015-10-22 DIAGNOSIS — E119 Type 2 diabetes mellitus without complications: Secondary | ICD-10-CM | POA: Diagnosis not present

## 2015-10-22 DIAGNOSIS — H2513 Age-related nuclear cataract, bilateral: Secondary | ICD-10-CM | POA: Diagnosis not present

## 2015-12-03 ENCOUNTER — Ambulatory Visit: Payer: Medicare Other | Admitting: Podiatry

## 2015-12-11 ENCOUNTER — Encounter: Payer: Self-pay | Admitting: Podiatry

## 2015-12-11 ENCOUNTER — Ambulatory Visit (INDEPENDENT_AMBULATORY_CARE_PROVIDER_SITE_OTHER): Payer: Medicare Other | Admitting: Podiatry

## 2015-12-11 DIAGNOSIS — B351 Tinea unguium: Secondary | ICD-10-CM

## 2015-12-11 DIAGNOSIS — M79676 Pain in unspecified toe(s): Secondary | ICD-10-CM | POA: Diagnosis not present

## 2015-12-11 NOTE — Progress Notes (Signed)
Patient ID: Curtis Hunt, male   DOB: 03-24-1951, 65 y.o.   MRN: [id number]  Subjective: This patient presents again complaining of painful toenails and requests toenail debridement This patient was last seen 07/27/2014 advised to return at three-month intervals, however, does not present until today. He denies any history of ulceration, claudication or amputation  Objective: Orientated 3 DP and PT pulses 2/4 bilaterally Capillary reflex immediate bilaterally Sensation to 10 g monofilament wire intact 5/5 bilaterally Vibratory sensation reactive bilaterally Ankle reflex equal and reactive bilaterally No open skin lesions bilaterally The toenails are streamlined elongated, discolored, hypertrophic and tender to direct palpation 6-10 HAV left  Assessment: Diabetic with satisfactory neurovascular status Symptomatic onychomycoses 6-10  Plan: Debridement toenails 10 without any bleeding recommended that patient return at three-month intervals, however, patient requested that he return at his request

## 2015-12-11 NOTE — Patient Instructions (Signed)
Diabetes and Foot Care Diabetes may cause you to have problems because of poor blood supply (circulation) to your feet and legs. This may cause the skin on your feet to become thinner, break easier, and heal more slowly. Your skin may become dry, and the skin may peel and crack. You may also have nerve damage in your legs and feet causing decreased feeling in them. You may not notice minor injuries to your feet that could lead to infections or more serious problems. Taking care of your feet is one of the most important things you can do for yourself.  HOME CARE INSTRUCTIONS  Wear shoes at all times, even in the house. Do not go barefoot. Bare feet are easily injured.  Check your feet daily for blisters, cuts, and redness. If you cannot see the bottom of your feet, use a mirror or ask someone for help.  Wash your feet with warm water (do not use hot water) and mild soap. Then pat your feet and the areas between your toes until they are completely dry. Do not soak your feet as this can dry your skin.  Apply a moisturizing lotion or petroleum jelly (that does not contain alcohol and is unscented) to the skin on your feet and to dry, brittle toenails. Do not apply lotion between your toes.  Trim your toenails straight across. Do not dig under them or around the cuticle. File the edges of your nails with an emery board or nail file.  Do not cut corns or calluses or try to remove them with medicine.  Wear clean socks or stockings every day. Make sure they are not too tight. Do not wear knee-high stockings since they may decrease blood flow to your legs.  Wear shoes that fit properly and have enough cushioning. To break in new shoes, wear them for just a few hours a day. This prevents you from injuring your feet. Always look in your shoes before you put them on to be sure there are no objects inside.  Do not cross your legs. This may decrease the blood flow to your feet.  If you find a minor scrape,  cut, or break in the skin on your feet, keep it and the skin around it clean and dry. These areas may be cleansed with mild soap and water. Do not cleanse the area with peroxide, alcohol, or iodine.  When you remove an adhesive bandage, be sure not to damage the skin around it.  If you have a wound, look at it several times a day to make sure it is healing.  Do not use heating pads or hot water bottles. They may burn your skin. If you have lost feeling in your feet or legs, you may not know it is happening until it is too late.  Make sure your health care provider performs a complete foot exam at least annually or more often if you have foot problems. Report any cuts, sores, or bruises to your health care provider immediately. SEEK MEDICAL CARE IF:   You have an injury that is not healing.  You have cuts or breaks in the skin.  You have an ingrown nail.  You notice redness on your legs or feet.  You feel burning or tingling in your legs or feet.  You have pain or cramps in your legs and feet.  Your legs or feet are numb.  Your feet always feel cold. SEEK IMMEDIATE MEDICAL CARE IF:   There is increasing redness,   swelling, or pain in or around a wound.  There is a red line that goes up your leg.  Pus is coming from a wound.  You develop a fever or as directed by your health care provider.  You notice a bad smell coming from an ulcer or wound.   This information is not intended to replace advice given to you by your health care provider. Make sure you discuss any questions you have with your health care provider.   Document Released: 05/08/2000 Document Revised: 01/11/2013 Document Reviewed: 10/18/2012 Elsevier Interactive Patient Education 2016 [employer]  

## 2015-12-19 ENCOUNTER — Other Ambulatory Visit: Payer: Self-pay | Admitting: Family Medicine

## 2015-12-19 NOTE — Telephone Encounter (Signed)
Refill appropriate and filled per protocol. 

## 2015-12-20 ENCOUNTER — Other Ambulatory Visit: Payer: Self-pay | Admitting: Family Medicine

## 2015-12-20 MED ORDER — INSULIN LISPRO 100 UNIT/ML (KWIKPEN)
16.0000 [IU] | PEN_INJECTOR | Freq: Three times a day (TID) | SUBCUTANEOUS | 3 refills | Status: DC
Start: 2015-12-20 — End: 2017-09-24

## 2015-12-20 NOTE — Telephone Encounter (Signed)
Medication called/sent to requested pharmacy  

## 2015-12-24 ENCOUNTER — Encounter: Payer: Self-pay | Admitting: Family Medicine

## 2015-12-24 MED ORDER — INSULIN DETEMIR 100 UNIT/ML FLEXPEN: 30.0000 [IU] | PEN_INJECTOR | Freq: Every day | SUBCUTANEOUS | 1 refills | Status: DC

## 2016-01-21 ENCOUNTER — Encounter: Payer: Self-pay | Admitting: Family Medicine

## 2016-01-23 ENCOUNTER — Other Ambulatory Visit: Payer: Self-pay | Admitting: Family Medicine

## 2016-01-23 MED ORDER — PIOGLITAZONE HCL 30 MG PO TABS: 30.0000 mg | ORAL_TABLET | Freq: Every day | ORAL | 5 refills | Status: DC

## 2016-01-30 ENCOUNTER — Other Ambulatory Visit: Payer: Self-pay | Admitting: Family Medicine

## 2016-02-12 ENCOUNTER — Encounter: Payer: Self-pay | Admitting: Family Medicine

## 2016-02-13 ENCOUNTER — Encounter: Payer: Self-pay | Admitting: Family Medicine

## 2016-02-18 ENCOUNTER — Other Ambulatory Visit: Payer: Medicare Other

## 2016-02-18 DIAGNOSIS — M6282 Rhabdomyolysis: Secondary | ICD-10-CM | POA: Diagnosis not present

## 2016-02-18 DIAGNOSIS — E1310 Other specified diabetes mellitus with ketoacidosis without coma: Secondary | ICD-10-CM | POA: Diagnosis not present

## 2016-02-18 DIAGNOSIS — E785 Hyperlipidemia, unspecified: Secondary | ICD-10-CM | POA: Diagnosis not present

## 2016-02-18 DIAGNOSIS — G9341 Metabolic encephalopathy: Secondary | ICD-10-CM | POA: Diagnosis not present

## 2016-02-18 DIAGNOSIS — N179 Acute kidney failure, unspecified: Secondary | ICD-10-CM | POA: Diagnosis not present

## 2016-02-18 DIAGNOSIS — Z79899 Other long term (current) drug therapy: Secondary | ICD-10-CM

## 2016-02-18 LAB — COMPLETE METABOLIC PANEL WITH GFR
ALT: 8 U/L — ABNORMAL LOW (ref 9–46)
AST: 13 U/L (ref 10–35)
Albumin: 4 g/dL (ref 3.6–5.1)
Alkaline Phosphatase: 52 U/L (ref 40–115)
BUN: 22 mg/dL (ref 7–25)
CALCIUM: 9.6 mg/dL (ref 8.6–10.3)
CHLORIDE: 105 mmol/L (ref 98–110)
CO2: 22 mmol/L (ref 20–31)
Creat: 1.6 mg/dL — ABNORMAL HIGH (ref 0.70–1.25)
GFR, EST NON AFRICAN AMERICAN: 44 mL/min — AB (ref >=60)
GFR, Est African American: 51 mL/min — ABNORMAL LOW (ref >=60)
Glucose, Bld: 101 mg/dL — ABNORMAL HIGH (ref 70–99)
POTASSIUM: 4.2 mmol/L (ref 3.5–5.3)
SODIUM: 138 mmol/L (ref 135–146)
Total Bilirubin: 0.3 mg/dL (ref 0.2–1.2)
Total Protein: 7.6 g/dL (ref 6.1–8.1)

## 2016-02-18 LAB — CBC WITH DIFFERENTIAL/PLATELET
BASOS ABS: 108 {cells}/uL (ref 0–200)
Basophils Relative: 2 %
EOS ABS: 162 {cells}/uL (ref 15–500)
Eosinophils Relative: 3 %
HEMATOCRIT: 38.3 % — AB (ref 38.5–50.0)
HEMOGLOBIN: 12.5 g/dL — AB (ref 13.0–17.0)
LYMPHS PCT: 31 %
Lymphs Abs: 1674 cells/uL (ref 850–3900)
MCH: 27.6 pg (ref 27.0–33.0)
MCHC: 32.6 g/dL (ref 32.0–36.0)
MCV: 84.5 fL (ref 80.0–100.0)
MONOS PCT: 8 %
MPV: 9.2 fL (ref 7.5–12.5)
Monocytes Absolute: 432 cells/uL (ref 200–950)
NEUTROS PCT: 56 %
Neutro Abs: 3024 cells/uL (ref 1500–7800)
Platelets: 313 10*3/uL (ref 140–400)
RBC: 4.53 MIL/uL (ref 4.20–5.80)
RDW: 16.2 % — ABNORMAL HIGH (ref 11.0–15.0)
WBC: 5.4 10*3/uL (ref 3.8–10.8)

## 2016-02-18 LAB — LIPID PANEL
CHOLESTEROL: 133 mg/dL (ref 125–200)
HDL: 44 mg/dL (ref >=40)
LDL Cholesterol: 61 mg/dL (ref <130)
Total CHOL/HDL Ratio: 3 Ratio (ref <=5.0)
Triglycerides: 139 mg/dL (ref <150)
VLDL: 28 mg/dL (ref <30)

## 2016-02-19 LAB — HEMOGLOBIN A1C
HEMOGLOBIN A1C: 6.9 % — AB (ref <5.7)
Mean Plasma Glucose: 151 mg/dL

## 2016-02-20 ENCOUNTER — Encounter: Payer: Self-pay | Admitting: Family Medicine

## 2016-03-15 ENCOUNTER — Other Ambulatory Visit: Payer: Self-pay | Admitting: Family Medicine

## 2016-03-16 ENCOUNTER — Other Ambulatory Visit: Payer: Self-pay | Admitting: Family Medicine

## 2016-04-24 DIAGNOSIS — C61 Malignant neoplasm of prostate: Secondary | ICD-10-CM | POA: Diagnosis not present

## 2016-05-08 ENCOUNTER — Other Ambulatory Visit: Payer: Medicare Other

## 2016-05-08 DIAGNOSIS — E785 Hyperlipidemia, unspecified: Secondary | ICD-10-CM

## 2016-05-08 DIAGNOSIS — E119 Type 2 diabetes mellitus without complications: Secondary | ICD-10-CM

## 2016-05-08 LAB — CBC WITH DIFFERENTIAL/PLATELET
BASOS PCT: 1 %
Basophils Absolute: 48 cells/uL (ref 0–200)
EOS ABS: 192 {cells}/uL (ref 15–500)
Eosinophils Relative: 4 %
HCT: 38.4 % — ABNORMAL LOW (ref 38.5–50.0)
Hemoglobin: 12.3 g/dL — ABNORMAL LOW (ref 13.0–17.0)
LYMPHS PCT: 33 %
Lymphs Abs: 1584 cells/uL (ref 850–3900)
MCH: 27.4 pg (ref 27.0–33.0)
MCHC: 32 g/dL (ref 32.0–36.0)
MCV: 85.5 fL (ref 80.0–100.0)
MONOS PCT: 10 %
MPV: 9.1 fL (ref 7.5–12.5)
Monocytes Absolute: 480 cells/uL (ref 200–950)
Neutro Abs: 2496 cells/uL (ref 1500–7800)
Neutrophils Relative %: 52 %
PLATELETS: 280 10*3/uL (ref 140–400)
RBC: 4.49 MIL/uL (ref 4.20–5.80)
RDW: 16.8 % — AB (ref 11.0–15.0)
WBC: 4.8 10*3/uL (ref 3.8–10.8)

## 2016-05-08 LAB — COMPLETE METABOLIC PANEL WITH GFR
ALT: 8 U/L — ABNORMAL LOW (ref 9–46)
AST: 15 U/L (ref 10–35)
Albumin: 3.9 g/dL (ref 3.6–5.1)
Alkaline Phosphatase: 59 U/L (ref 40–115)
BILIRUBIN TOTAL: 0.3 mg/dL (ref 0.2–1.2)
BUN: 20 mg/dL (ref 7–25)
CALCIUM: 9.2 mg/dL (ref 8.6–10.3)
CO2: 26 mmol/L (ref 20–31)
CREATININE: 1.6 mg/dL — AB (ref 0.70–1.25)
Chloride: 103 mmol/L (ref 98–110)
GFR, EST AFRICAN AMERICAN: 51 mL/min — AB (ref >=60)
GFR, Est Non African American: 44 mL/min — ABNORMAL LOW (ref >=60)
Glucose, Bld: 88 mg/dL (ref 70–99)
Potassium: 4.3 mmol/L (ref 3.5–5.3)
SODIUM: 138 mmol/L (ref 135–146)
Total Protein: 7.3 g/dL (ref 6.1–8.1)

## 2016-05-08 LAB — HEMOGLOBIN A1C
Hgb A1c MFr Bld: 6.7 % — ABNORMAL HIGH (ref <5.7)
Mean Plasma Glucose: 146 mg/dL

## 2016-05-08 LAB — LIPID PANEL
CHOLESTEROL: 136 mg/dL (ref <200)
HDL: 48 mg/dL (ref >40)
LDL CALC: 60 mg/dL (ref <100)
Total CHOL/HDL Ratio: 2.8 Ratio (ref <5.0)
Triglycerides: 139 mg/dL (ref <150)
VLDL: 28 mg/dL (ref <30)

## 2016-05-11 ENCOUNTER — Encounter: Payer: Self-pay | Admitting: Family Medicine

## 2016-05-15 ENCOUNTER — Other Ambulatory Visit: Payer: Self-pay | Admitting: Family Medicine

## 2016-06-10 ENCOUNTER — Other Ambulatory Visit: Payer: Self-pay | Admitting: Family Medicine

## 2016-08-14 ENCOUNTER — Other Ambulatory Visit: Payer: Self-pay | Admitting: Family Medicine

## 2016-08-14 NOTE — Telephone Encounter (Signed)
Medication refilled per protocol. 

## 2016-08-20 ENCOUNTER — Other Ambulatory Visit: Payer: Self-pay | Admitting: *Deleted

## 2016-08-20 MED ORDER — ROSUVASTATIN CALCIUM 40 MG PO TABS: 40.0000 mg | ORAL_TABLET | Freq: Every day | ORAL | 3 refills | Status: DC

## 2016-09-02 ENCOUNTER — Other Ambulatory Visit: Payer: Medicare Other

## 2016-09-02 ENCOUNTER — Other Ambulatory Visit: Payer: Self-pay | Admitting: Family Medicine

## 2016-09-02 DIAGNOSIS — IMO0002 Reserved for concepts with insufficient information to code with codable children: Secondary | ICD-10-CM

## 2016-09-02 DIAGNOSIS — E785 Hyperlipidemia, unspecified: Secondary | ICD-10-CM

## 2016-09-02 DIAGNOSIS — Z0000 Encounter for general adult medical examination without abnormal findings: Secondary | ICD-10-CM | POA: Diagnosis not present

## 2016-09-02 DIAGNOSIS — Z79899 Other long term (current) drug therapy: Secondary | ICD-10-CM

## 2016-09-02 DIAGNOSIS — E1121 Type 2 diabetes mellitus with diabetic nephropathy: Secondary | ICD-10-CM | POA: Diagnosis not present

## 2016-09-02 DIAGNOSIS — Z794 Long term (current) use of insulin: Secondary | ICD-10-CM

## 2016-09-02 DIAGNOSIS — E119 Type 2 diabetes mellitus without complications: Secondary | ICD-10-CM

## 2016-09-02 DIAGNOSIS — Z125 Encounter for screening for malignant neoplasm of prostate: Secondary | ICD-10-CM

## 2016-09-02 DIAGNOSIS — E1165 Type 2 diabetes mellitus with hyperglycemia: Secondary | ICD-10-CM

## 2016-09-02 LAB — CBC WITH DIFFERENTIAL/PLATELET
BASOS PCT: 2 %
Basophils Absolute: 94 cells/uL (ref 0–200)
EOS ABS: 141 {cells}/uL (ref 15–500)
Eosinophils Relative: 3 %
HEMATOCRIT: 39.3 % (ref 38.5–50.0)
Hemoglobin: 12.8 g/dL — ABNORMAL LOW (ref 13.0–17.0)
LYMPHS ABS: 1457 {cells}/uL (ref 850–3900)
LYMPHS PCT: 31 %
MCH: 28.1 pg (ref 27.0–33.0)
MCHC: 32.6 g/dL (ref 32.0–36.0)
MCV: 86.2 fL (ref 80.0–100.0)
MONO ABS: 423 {cells}/uL (ref 200–950)
MPV: 9.3 fL (ref 7.5–12.5)
Monocytes Relative: 9 %
NEUTROS PCT: 55 %
Neutro Abs: 2585 cells/uL (ref 1500–7800)
Platelets: 263 10*3/uL (ref 140–400)
RBC: 4.56 MIL/uL (ref 4.20–5.80)
RDW: 16.3 % — AB (ref 11.0–15.0)
WBC: 4.7 10*3/uL (ref 3.8–10.8)

## 2016-09-02 LAB — COMPLETE METABOLIC PANEL WITH GFR
ALT: 8 U/L — AB (ref 9–46)
AST: 14 U/L (ref 10–35)
Albumin: 3.8 g/dL (ref 3.6–5.1)
Alkaline Phosphatase: 54 U/L (ref 40–115)
BUN: 18 mg/dL (ref 7–25)
CHLORIDE: 106 mmol/L (ref 98–110)
CO2: 23 mmol/L (ref 20–31)
CREATININE: 1.4 mg/dL — AB (ref 0.70–1.25)
Calcium: 9.2 mg/dL (ref 8.6–10.3)
GFR, Est African American: 60 mL/min (ref >=60)
GFR, Est Non African American: 52 mL/min — ABNORMAL LOW (ref >=60)
Glucose, Bld: 90 mg/dL (ref 70–99)
POTASSIUM: 3.9 mmol/L (ref 3.5–5.3)
SODIUM: 139 mmol/L (ref 135–146)
Total Bilirubin: 0.3 mg/dL (ref 0.2–1.2)
Total Protein: 7.2 g/dL (ref 6.1–8.1)

## 2016-09-02 LAB — LIPID PANEL
CHOL/HDL RATIO: 2.7 ratio (ref <5.0)
CHOLESTEROL: 129 mg/dL (ref <200)
HDL: 47 mg/dL (ref >40)
LDL Cholesterol: 58 mg/dL (ref <100)
Triglycerides: 118 mg/dL (ref <150)
VLDL: 24 mg/dL (ref <30)

## 2016-09-02 LAB — TSH: TSH: 1.76 mIU/L (ref 0.40–4.50)

## 2016-09-03 LAB — HEMOGLOBIN A1C
Hgb A1c MFr Bld: 6.6 % — ABNORMAL HIGH (ref <5.7)
MEAN PLASMA GLUCOSE: 143 mg/dL

## 2016-09-03 LAB — PSA

## 2016-09-07 ENCOUNTER — Encounter: Payer: Medicare Other | Admitting: Family Medicine

## 2016-09-09 ENCOUNTER — Other Ambulatory Visit: Payer: Self-pay | Admitting: Family Medicine

## 2016-09-21 ENCOUNTER — Ambulatory Visit (INDEPENDENT_AMBULATORY_CARE_PROVIDER_SITE_OTHER): Payer: Medicare Other | Admitting: Family Medicine

## 2016-09-21 ENCOUNTER — Encounter: Payer: Self-pay | Admitting: Family Medicine

## 2016-09-21 VITALS — BP 122/74 | HR 74 | Temp 98.2°F | Resp 18 | Ht 73.0 in | Wt 267.0 lb

## 2016-09-21 DIAGNOSIS — I10 Essential (primary) hypertension: Secondary | ICD-10-CM | POA: Diagnosis not present

## 2016-09-21 DIAGNOSIS — E7800 Pure hypercholesterolemia, unspecified: Secondary | ICD-10-CM

## 2016-09-21 DIAGNOSIS — Z0000 Encounter for general adult medical examination without abnormal findings: Secondary | ICD-10-CM | POA: Diagnosis not present

## 2016-09-21 DIAGNOSIS — N183 Chronic kidney disease, stage 3 unspecified: Secondary | ICD-10-CM

## 2016-09-21 DIAGNOSIS — E119 Type 2 diabetes mellitus without complications: Secondary | ICD-10-CM

## 2016-09-21 DIAGNOSIS — Z794 Long term (current) use of insulin: Secondary | ICD-10-CM

## 2016-09-21 NOTE — Progress Notes (Signed)
Subjective:    Patient ID: Curtis Hunt, male    DOB: [birth year], 66 y.o.   MRN: [id number]  HPI Patient is here today for complete physical exam. Since I last saw him, he lost his wife Georga Kaufmann to ovarian cancer.  Has insulin-dependent diabetes mellitus.  He denies any hypoglycemic episodes. He denies any polyuria, polydipsia, or blurred vision. HgA1c Iis outstanding.  He denies any neuropathy in his feet. Prevnar 13 is due. Pneumovax 23 is up-to-date. Shingles vaccine is up-to-date. Flu shot is up-to-date. PSA is <0.1 (s/p prostatectomy in 2004). Colonoscopy is up-to-date. He declines hepatitis C screening. Diabetic eye exam is performed annually in May.   Appointment on 09/02/2016  Component Date Value Ref Range Status  . Sodium 09/02/2016 139  135 - 146 mmol/L Final  . Potassium 09/02/2016 3.9  3.5 - 5.3 mmol/L Final  . Chloride 09/02/2016 106  98 - 110 mmol/L Final  . CO2 09/02/2016 23  20 - 31 mmol/L Final  . Glucose, Bld 09/02/2016 90  70 - 99 mg/dL Final  . BUN 09/02/2016 18  7 - 25 mg/dL Final  . Creat 09/02/2016 1.40* 0.70 - 1.25 mg/dL Final   Comment:   For patients > or = 67 years of age: The upper reference limit for Creatinine is approximately 13% higher for people identified as African-American.     . Total Bilirubin 09/02/2016 0.3  0.2 - 1.2 mg/dL Final  . Alkaline Phosphatase 09/02/2016 54  40 - 115 U/L Final  . AST 09/02/2016 14  10 - 35 U/L Final  . ALT 09/02/2016 8* 9 - 46 U/L Final  . Total Protein 09/02/2016 7.2  6.1 - 8.1 g/dL Final  . Albumin 09/02/2016 3.8  3.6 - 5.1 g/dL Final  . Calcium 09/02/2016 9.2  8.6 - 10.3 mg/dL Final  . GFR, Est African American 09/02/2016 60  >=60 mL/min Final  . GFR, Est Non African American 09/02/2016 52* >=60 mL/min Final  . WBC 09/02/2016 4.7  3.8 - 10.8 K/uL Final  . RBC 09/02/2016 4.56  4.20 - 5.80 MIL/uL Final  . Hemoglobin 09/02/2016 12.8* 13.0 - 17.0 g/dL Final  . HCT 09/02/2016 39.3  38.5 - 50.0 % Final  . MCV  09/02/2016 86.2  80.0 - 100.0 fL Final  . MCH 09/02/2016 28.1  27.0 - 33.0 pg Final  . MCHC 09/02/2016 32.6  32.0 - 36.0 g/dL Final  . RDW 09/02/2016 16.3* 11.0 - 15.0 % Final  . Platelets 09/02/2016 263  140 - 400 K/uL Final  . MPV 09/02/2016 9.3  7.5 - 12.5 fL Final  . Neutro Abs 09/02/2016 2585  1,500 - 7,800 cells/uL Final  . Lymphs Abs 09/02/2016 1457  850 - 3,900 cells/uL Final  . Monocytes Absolute 09/02/2016 423  200 - 950 cells/uL Final  . Eosinophils Absolute 09/02/2016 141  15 - 500 cells/uL Final  . Basophils Absolute 09/02/2016 94  0 - 200 cells/uL Final  . Neutrophils Relative % 09/02/2016 55  % Final  . Lymphocytes Relative 09/02/2016 31  % Final  . Monocytes Relative 09/02/2016 9  % Final  . Eosinophils Relative 09/02/2016 3  % Final  . Basophils Relative 09/02/2016 2  % Final  . Smear Review 09/02/2016 Criteria for review not met   Final  . Hgb A1c MFr Bld 09/02/2016 6.6* <5.7 % Final   Comment:   For someone without known diabetes, a hemoglobin A1c value of 6.5% or greater indicates that they may have  diabetes and this should be confirmed with a follow-up test.   For someone with known diabetes, a value <7% indicates that their diabetes is well controlled and a value greater than or equal to 7% indicates suboptimal control. A1c targets should be individualized based on duration of diabetes, age, comorbid conditions, and other considerations.   Currently, no consensus exists for use of hemoglobin A1c for diagnosis of diabetes for children.     . Mean Plasma Glucose 09/02/2016 143  mg/dL Final  . Cholesterol 09/02/2016 129  <200 mg/dL Final  . Triglycerides 09/02/2016 118  <150 mg/dL Final  . HDL 09/02/2016 47  >40 mg/dL Final  . Total CHOL/HDL Ratio 09/02/2016 2.7  <5.0 Ratio Final  . VLDL 09/02/2016 24  <30 mg/dL Final  . LDL Cholesterol 09/02/2016 58  <100 mg/dL Final  . TSH 09/02/2016 1.76  0.40 - 4.50 mIU/L Final  . PSA 09/02/2016 <0.1  <=4.0 ng/mL Final     Comment:   The total PSA value from this assay system is standardized against the WHO standard. The test result will be approximately 20% lower when compared to the equimolar-standardized total PSA ([employer]). Comparison of serial PSA results should be interpreted with this fact in mind.   This test was performed using the Siemens chemiluminescent method. Values obtained from different assay methods cannot be used interchangeably. PSA levels, regardless of value, should not be interpreted as absolute evidence of the presence or absence of disease.   The current order code 23780 has a lower limit of quantification of 0.1 ng/mL. For post-prostatectomy patients, please use order code [phone] (PSA, Post-Prostatectomy), which can be added on within 5 days of date of collection. The lower limit of accurate quantification for order code [id number] is 0.02 ng/mL. PSA values less than 0.02 ng/mL cannot be accurately measured and will be reported as <0.02 ng/mL. Specimens with PSA leve                          ls below the lower limit of accurate quantification should be considered as negative.    Past Medical History:  Diagnosis Date  . Cancer [facility]) 2004   Prostate CA with surgery  . Diabetes mellitus without complication ([place])   . Gout   . Hyperlipidemia   . Prostate cancer [facility])    Past Surgical History:  Procedure Laterality Date  . PROSTATE SURGERY     Current Outpatient Prescriptions on File Prior to Visit  Medication Sig Dispense Refill  . allopurinol (ZYLOPRIM) 300 MG tablet TAKE 1 TABLET EVERY DAY 90 tablet 2  . aspirin 81 MG tablet Take 81 mg by mouth daily.    . B-D UF III MINI PEN NEEDLES 31G X 5 MM MISC USE AS DIRECTED TWICE A DAY 100 each 3  . Blood Glucose Monitoring Suppl (ONE TOUCH ULTRA 2) W/DEVICE KIT As driected 1 each 0  . glucose blood (ONE TOUCH ULTRA TEST) test strip CHECK SUGAR 4-5 TIMES A DAY.EVERY MORNING AFTER MEALS AND AT BEDTIME 200 each 8  . Insulin  Detemir (LEVEMIR FLEXTOUCH) 100 UNIT/ML Pen Inject 30 Units into the skin daily at 10 pm. 15 pen 1  . insulin lispro (HUMALOG KWIKPEN) 100 UNIT/ML KiwkPen Inject 0.16 mLs (16 Units total) into the skin 3 (three) times daily. 15 pen 3  . Insulin Pen Needle 31G X 6 MM MISC Use with Levemir Flextouch Pen 100 each 0  . INVOKANA 300 MG TABS  tablet TAKE 1 TABLET BY MOUTH EVERY DAY BEFORE BREAKFAST 30 tablet 3  . NOVOLOG FLEXPEN 100 UNIT/ML FlexPen INJECT 16 UNITS INTO THE SKIN 3 TIMES DAILY WITH MEALS. (Patient taking differently: INJECT 8 UNITS INTO THE SKIN 3 TIMES DAILY WITH MEALS.) 15 pen 0  . rosuvastatin (CRESTOR) 40 MG tablet Take 1 tablet (40 mg total) by mouth daily. 90 tablet 3  . valsartan (DIOVAN) 160 MG tablet TAKE 1 TABLET (160 MG TOTAL) BY MOUTH DAILY. 30 tablet 11  . VIAGRA 100 MG tablet TAKE 0.5-1 TABLETS (50-100 MG TOTAL) BY MOUTH DAILY AS NEEDED FOR ERECTILE DYSFUNCTION. 5 tablet 0   No current facility-administered medications on file prior to visit.    No Known Allergies Social History   Social History  . Marital status: Married    Spouse name: N/A  . Number of children: N/A  . Years of education: N/A   Occupational History  . Not on file.   Social History Main Topics  . Smoking status: Never Smoker  . Smokeless tobacco: Never Used  . Alcohol use Yes     Comment: Beer prior to diagnosis of diabetes, about once a week during sports on tv  . Drug use: No  . Sexual activity: Not Currently   Other Topics Concern  . Not on file   Social History Narrative  . No narrative on file   Family History  Problem Relation Age of Onset  . Cancer Brother     prostate  . Cancer Mother     breast ca      Review of Systems  All other systems reviewed and are negative.      Objective:   Physical Exam  Constitutional: He is oriented to person, place, and time. He appears well-developed and well-nourished. No distress.  HENT:  Head: Normocephalic and atraumatic.    Right Ear: External ear normal.  Left Ear: External ear normal.  Nose: Nose normal.  Mouth/Throat: Oropharynx is clear and moist. No oropharyngeal exudate.  Eyes: Conjunctivae and EOM are normal. Pupils are equal, round, and reactive to light. Right eye exhibits no discharge. Left eye exhibits no discharge. No scleral icterus.  Neck: Normal range of motion. Neck supple. No JVD present. No tracheal deviation present. No thyromegaly present.  Cardiovascular: Normal rate, regular rhythm, normal heart sounds and intact distal pulses.  Exam reveals no gallop and no friction rub.   No murmur heard. Pulmonary/Chest: Effort normal and breath sounds normal. No stridor. No respiratory distress. He has no wheezes. He has no rales. He exhibits no tenderness.  Abdominal: Soft. Bowel sounds are normal. He exhibits no distension and no mass. There is no tenderness. There is no rebound and no guarding.  Musculoskeletal: Normal range of motion. He exhibits no edema or tenderness.  Lymphadenopathy:    He has no cervical adenopathy.  Neurological: He is alert and oriented to person, place, and time. He has normal reflexes. No cranial nerve deficit. He exhibits normal muscle tone. Coordination normal.  Skin: Skin is warm. No rash noted. He is not diaphoretic. No erythema. No pallor.  Psychiatric: He has a normal mood and affect. His behavior is normal. Judgment and thought content normal.  Vitals reviewed.         Assessment & Plan:  Pure hypercholesterolemia  Controlled type 2 diabetes mellitus without complication, with long-term current use of insulin (HCC)  CKD (chronic kidney disease), stage III  Benign essential HTN  Routine general medical examination at a  health care facility  Patient's blood pressure is well controlled. We discussed his chronic kidney disease which is stable.Diabetes is well controlled as is his cholesterol. I am very happy with his lab work. Recommended Prevnar 13 but the  patient would like to defer that to his next office visit. Cancer screening is up-to-date. He declines hepatitis C screening. The remainder of his lab work is excellent. Diabetic eye exam and diabetic foot exam are up-to-date.

## 2016-09-21 NOTE — Addendum Note (Signed)
Addended by: Shary Decamp B on: 09/21/2016 11:53 AM   Modules accepted: Orders

## 2016-09-24 ENCOUNTER — Ambulatory Visit: Payer: Medicare Other | Admitting: Family Medicine

## 2016-09-24 DIAGNOSIS — Z111 Encounter for screening for respiratory tuberculosis: Secondary | ICD-10-CM

## 2016-09-24 LAB — TB SKIN TEST
Induration: 0 mm
TB SKIN TEST: NEGATIVE

## 2016-09-24 NOTE — Patient Instructions (Signed)
PPD TB skin test read.  No redness or induration at test site.  Results given to patient

## 2016-10-17 ENCOUNTER — Other Ambulatory Visit: Payer: Self-pay | Admitting: Family Medicine

## 2016-10-20 ENCOUNTER — Other Ambulatory Visit: Payer: Self-pay | Admitting: Family Medicine

## 2016-10-23 DIAGNOSIS — H2513 Age-related nuclear cataract, bilateral: Secondary | ICD-10-CM | POA: Diagnosis not present

## 2016-10-23 DIAGNOSIS — H52223 Regular astigmatism, bilateral: Secondary | ICD-10-CM | POA: Diagnosis not present

## 2016-10-23 DIAGNOSIS — E119 Type 2 diabetes mellitus without complications: Secondary | ICD-10-CM | POA: Diagnosis not present

## 2016-10-23 DIAGNOSIS — H43811 Vitreous degeneration, right eye: Secondary | ICD-10-CM | POA: Diagnosis not present

## 2017-01-13 ENCOUNTER — Other Ambulatory Visit: Payer: Self-pay | Admitting: Family Medicine

## 2017-03-23 ENCOUNTER — Encounter: Payer: Self-pay | Admitting: Family Medicine

## 2017-03-29 ENCOUNTER — Other Ambulatory Visit: Payer: Medicare Other

## 2017-03-29 DIAGNOSIS — E119 Type 2 diabetes mellitus without complications: Secondary | ICD-10-CM

## 2017-03-29 DIAGNOSIS — E785 Hyperlipidemia, unspecified: Secondary | ICD-10-CM

## 2017-03-30 ENCOUNTER — Encounter: Payer: Self-pay | Admitting: Family Medicine

## 2017-03-30 LAB — CBC WITH DIFFERENTIAL/PLATELET
BASOS ABS: 103 {cells}/uL (ref 0–200)
Basophils Relative: 1.8 %
EOS PCT: 2.8 %
Eosinophils Absolute: 160 cells/uL (ref 15–500)
HEMATOCRIT: 40 % (ref 38.5–50.0)
Hemoglobin: 13.2 g/dL (ref 13.2–17.1)
LYMPHS ABS: 1887 {cells}/uL (ref 850–3900)
MCH: 27.7 pg (ref 27.0–33.0)
MCHC: 33 g/dL (ref 32.0–36.0)
MCV: 83.9 fL (ref 80.0–100.0)
MONOS PCT: 9.6 %
MPV: 10.2 fL (ref 7.5–12.5)
NEUTROS ABS: 3004 {cells}/uL (ref 1500–7800)
NEUTROS PCT: 52.7 %
Platelets: 307 10*3/uL (ref 140–400)
RBC: 4.77 10*6/uL (ref 4.20–5.80)
RDW: 14.9 % (ref 11.0–15.0)
Total Lymphocyte: 33.1 %
WBC mixed population: 547 cells/uL (ref 200–950)
WBC: 5.7 10*3/uL (ref 3.8–10.8)

## 2017-03-30 LAB — COMPLETE METABOLIC PANEL WITH GFR
AG Ratio: 1.1 (calc) (ref 1.0–2.5)
ALKALINE PHOSPHATASE (APISO): 62 U/L (ref 40–115)
ALT: 9 U/L (ref 9–46)
AST: 14 U/L (ref 10–35)
Albumin: 4.3 g/dL (ref 3.6–5.1)
BILIRUBIN TOTAL: 0.3 mg/dL (ref 0.2–1.2)
BUN/Creatinine Ratio: 15 (calc) (ref 6–22)
BUN: 25 mg/dL (ref 7–25)
CHLORIDE: 103 mmol/L (ref 98–110)
CO2: 26 mmol/L (ref 20–32)
Calcium: 10.1 mg/dL (ref 8.6–10.3)
Creat: 1.62 mg/dL — ABNORMAL HIGH (ref 0.70–1.25)
GFR, Est African American: 50 mL/min/{1.73_m2} — ABNORMAL LOW (ref >=60)
GFR, Est Non African American: 43 mL/min/{1.73_m2} — ABNORMAL LOW (ref >=60)
GLUCOSE: 103 mg/dL — AB (ref 65–99)
Globulin: 3.8 g/dL (calc) — ABNORMAL HIGH (ref 1.9–3.7)
POTASSIUM: 4.4 mmol/L (ref 3.5–5.3)
Sodium: 138 mmol/L (ref 135–146)
Total Protein: 8.1 g/dL (ref 6.1–8.1)

## 2017-03-30 LAB — LIPID PANEL
CHOLESTEROL: 160 mg/dL (ref <200)
HDL: 52 mg/dL (ref >40)
LDL Cholesterol (Calc): 82 mg/dL (calc)
NON-HDL CHOLESTEROL (CALC): 108 mg/dL (ref <130)
TRIGLYCERIDES: 159 mg/dL — AB (ref <150)
Total CHOL/HDL Ratio: 3.1 (calc) (ref <5.0)

## 2017-03-30 LAB — HEMOGLOBIN A1C
EAG (MMOL/L): 8.4 (calc)
Hgb A1c MFr Bld: 6.9 % of total Hgb — ABNORMAL HIGH (ref <5.7)
Mean Plasma Glucose: 151 (calc)

## 2017-04-13 ENCOUNTER — Other Ambulatory Visit: Payer: Self-pay | Admitting: Family Medicine

## 2017-04-19 DIAGNOSIS — C61 Malignant neoplasm of prostate: Secondary | ICD-10-CM | POA: Diagnosis not present

## 2017-04-28 DIAGNOSIS — C61 Malignant neoplasm of prostate: Secondary | ICD-10-CM | POA: Diagnosis not present

## 2017-05-08 ENCOUNTER — Other Ambulatory Visit: Payer: Self-pay | Admitting: Family Medicine

## 2017-05-12 ENCOUNTER — Other Ambulatory Visit: Payer: Self-pay | Admitting: Family Medicine

## 2017-07-11 ENCOUNTER — Other Ambulatory Visit: Payer: Self-pay | Admitting: Family Medicine

## 2017-09-01 ENCOUNTER — Other Ambulatory Visit: Payer: Self-pay | Admitting: Family Medicine

## 2017-09-17 ENCOUNTER — Other Ambulatory Visit: Payer: Self-pay | Admitting: Family Medicine

## 2017-09-21 ENCOUNTER — Other Ambulatory Visit: Payer: Medicare Other

## 2017-09-21 DIAGNOSIS — N183 Chronic kidney disease, stage 3 unspecified: Secondary | ICD-10-CM

## 2017-09-21 DIAGNOSIS — E7800 Pure hypercholesterolemia, unspecified: Secondary | ICD-10-CM

## 2017-09-21 DIAGNOSIS — Z125 Encounter for screening for malignant neoplasm of prostate: Secondary | ICD-10-CM

## 2017-09-21 DIAGNOSIS — Z0000 Encounter for general adult medical examination without abnormal findings: Secondary | ICD-10-CM

## 2017-09-21 DIAGNOSIS — Z111 Encounter for screening for respiratory tuberculosis: Secondary | ICD-10-CM

## 2017-09-21 DIAGNOSIS — I10 Essential (primary) hypertension: Secondary | ICD-10-CM | POA: Diagnosis not present

## 2017-09-21 DIAGNOSIS — E119 Type 2 diabetes mellitus without complications: Secondary | ICD-10-CM

## 2017-09-21 DIAGNOSIS — Z1159 Encounter for screening for other viral diseases: Secondary | ICD-10-CM | POA: Diagnosis not present

## 2017-09-21 DIAGNOSIS — Z13818 Encounter for screening for other digestive system disorders: Secondary | ICD-10-CM | POA: Diagnosis not present

## 2017-09-21 DIAGNOSIS — Z794 Long term (current) use of insulin: Secondary | ICD-10-CM

## 2017-09-23 LAB — QUANTIFERON-TB GOLD PLUS
Mitogen-NIL: 6.8 IU/mL
NIL: 0.21 [IU]/mL
QUANTIFERON-TB GOLD PLUS: NEGATIVE

## 2017-09-24 ENCOUNTER — Ambulatory Visit (INDEPENDENT_AMBULATORY_CARE_PROVIDER_SITE_OTHER): Payer: Medicare Other | Admitting: Family Medicine

## 2017-09-24 ENCOUNTER — Encounter: Payer: Self-pay | Admitting: Family Medicine

## 2017-09-24 VITALS — BP 100/54 | HR 78 | Temp 98.3°F | Resp 16 | Ht 73.0 in | Wt 257.0 lb

## 2017-09-24 DIAGNOSIS — Z0000 Encounter for general adult medical examination without abnormal findings: Secondary | ICD-10-CM | POA: Diagnosis not present

## 2017-09-24 DIAGNOSIS — E119 Type 2 diabetes mellitus without complications: Secondary | ICD-10-CM

## 2017-09-24 DIAGNOSIS — C61 Malignant neoplasm of prostate: Secondary | ICD-10-CM

## 2017-09-24 DIAGNOSIS — N183 Chronic kidney disease, stage 3 unspecified: Secondary | ICD-10-CM

## 2017-09-24 DIAGNOSIS — Z794 Long term (current) use of insulin: Secondary | ICD-10-CM

## 2017-09-24 DIAGNOSIS — E7800 Pure hypercholesterolemia, unspecified: Secondary | ICD-10-CM | POA: Diagnosis not present

## 2017-09-24 DIAGNOSIS — Z23 Encounter for immunization: Secondary | ICD-10-CM | POA: Diagnosis not present

## 2017-09-24 NOTE — Progress Notes (Signed)
Subjective:    Patient ID: Curtis Hunt, male    DOB: 07/09/1950, 67 y.o.   MRN: [id number]  HPI Patient is here today for complete physical exam.  He has had Pneumovax 23 in 2015.  He is not yet due for a booster on that.  He is very hesitant to receive any other vaccinations however he is due for Prevnar 13.  After much discussion, I am able to convince the patient received Prevnar 13.  He is due for hepatitis C screening.  His chart states that he is due for an eye exam as well as a urine microalbumin.  Patient states that he has an appointment already scheduled with his ophthalmologist for later this summer.  We can check a urine microalbumin today.  His colonoscopy is up-to-date.  He does have a history of prostate cancer and is due to monitor his PSA today.  His most recent lab work is listed below followed by his shot record: Lab on 09/21/2017  Component Date Value Ref Range Status  . WBC 09/21/2017 4.5  3.8 - 10.8 Thousand/uL Final  . RBC 09/21/2017 4.82  4.20 - 5.80 Million/uL Final  . Hemoglobin 09/21/2017 13.3  13.2 - 17.1 g/dL Final  . HCT 09/21/2017 40.4  38.5 - 50.0 % Final  . MCV 09/21/2017 83.8  80.0 - 100.0 fL Final  . MCH 09/21/2017 27.6  27.0 - 33.0 pg Final  . MCHC 09/21/2017 32.9  32.0 - 36.0 g/dL Final  . RDW 09/21/2017 14.1  11.0 - 15.0 % Final  . Platelets 09/21/2017 275  140 - 400 Thousand/uL Final  . MPV 09/21/2017 10.0  7.5 - 12.5 fL Final  . Neutro Abs 09/21/2017 2,246  1,500 - 7,800 cells/uL Final  . Lymphs Abs 09/21/2017 1,535  850 - 3,900 cells/uL Final  . WBC mixed population 09/21/2017 423  200 - 950 cells/uL Final  . Eosinophils Absolute 09/21/2017 189  15 - 500 cells/uL Final  . Basophils Absolute 09/21/2017 108  0 - 200 cells/uL Final  . Neutrophils Relative % 09/21/2017 49.9  % Final  . Total Lymphocyte 09/21/2017 34.1  % Final  . Monocytes Relative 09/21/2017 9.4  % Final  . Eosinophils Relative 09/21/2017 4.2  % Final  . Basophils Relative  09/21/2017 2.4  % Final  . Glucose, Bld 09/21/2017 96  65 - 99 mg/dL Final   Comment: .            Fasting reference interval .   . BUN 09/21/2017 19  7 - 25 mg/dL Final  . Creat 09/21/2017 1.43* 0.70 - 1.25 mg/dL Final   Comment: For patients >56 years of age, the reference limit for Creatinine is approximately 13% higher for people identified as African-American. .   Havery Moros Ratio 09/21/2017 13  6 - 22 (calc) Final  . Sodium 09/21/2017 141  135 - 146 mmol/L Final  . Potassium 09/21/2017 4.1  3.5 - 5.3 mmol/L Final  . Chloride 09/21/2017 106  98 - 110 mmol/L Final  . CO2 09/21/2017 25  20 - 32 mmol/L Final  . Calcium 09/21/2017 9.6  8.6 - 10.3 mg/dL Final  . Total Protein 09/21/2017 7.7  6.1 - 8.1 g/dL Final  . Albumin 09/21/2017 4.1  3.6 - 5.1 g/dL Final  . Globulin 09/21/2017 3.6  1.9 - 3.7 g/dL (calc) Final  . AG Ratio 09/21/2017 1.1  1.0 - 2.5 (calc) Final  . Total Bilirubin 09/21/2017 0.3  0.2 - 1.2 mg/dL  Final  . Alkaline phosphatase (APISO) 09/21/2017 67  40 - 115 U/L Final  . AST 09/21/2017 13  10 - 35 U/L Final  . ALT 09/21/2017 9  9 - 46 U/L Final  . Cholesterol 09/21/2017 140  <200 mg/dL Final  . HDL 09/21/2017 47  >40 mg/dL Final  . Triglycerides 09/21/2017 100  <150 mg/dL Final  . LDL Cholesterol (Calc) 09/21/2017 74  mg/dL (calc) Final   Comment: Reference range: <100 . Desirable range <100 mg/dL for primary prevention;   <70 mg/dL for patients with CHD or diabetic patients  with > or = 2 CHD risk factors. Marland Kitchen LDL-C is now calculated using the Martin-Hopkins  calculation, which is a validated novel method providing  better accuracy than the Friedewald equation in the  estimation of LDL-C.  Cresenciano Genre et al. Annamaria Helling. 4782;956(21): 2061-2068  (http://education.[facility].com/faq/FAQ164)   . Total CHOL/HDL Ratio 09/21/2017 3.0  <5.0 (calc) Final  . Non-HDL Cholesterol (Calc) 09/21/2017 93  <130 mg/dL (calc) Final   Comment: For patients with diabetes  plus 1 major ASCVD risk  factor, treating to a non-HDL-C goal of <100 mg/dL  (LDL-C of <70 mg/dL) is considered a therapeutic  option.   . QuantiFERON-TB Gold Plus 09/21/2017 NEGATIVE  NEGATIVE Final   Comment: Negative test result. M. tuberculosis complex  infection unlikely.   Marland Kitchen NIL 09/21/2017 0.21  IU/mL Final  . Mitogen-NIL 09/21/2017 6.80  IU/mL Final  . TB1-NIL 09/21/2017 <0.00  IU/mL Final  . TB2-NIL 09/21/2017 <0.00  IU/mL Final   Comment: . The Nil tube value reflects the background interferon gamma immune response of the patient's blood sample. This value has been subtracted from the patient's displayed TB and Mitogen results. . Lower than expected results with the Mitogen tube prevent false-negative Quantiferon readings by detecting a patient with a potential immune suppressive condition and/or suboptimal pre-analytical specimen handling. . The TB1 Antigen tube is coated with the M. tuberculosis-specific antigens designed to elicit responses from TB antigen primed CD4+ helper T-lymphocytes. . The TB2 Antigen tube is coated with the M. tuberculosis-specific antigens designed to elicit responses from TB antigen primed CD4+ helper and CD8+ cytotoxic T-lymphocytes. . For additional information, please refer to http://education.[facility].com/faq/204 (This link is being provided for informational/ educational purposes only.) .   Marland Kitchen Hgb A1c MFr Bld 09/21/2017 7.1* <5.7 % of total Hgb Final   Comment: For someone without known diabetes, a hemoglobin A1c value of 6.5% or greater indicates that they may have  diabetes and this should be confirmed with a follow-up  test. . For someone with known diabetes, a value <7% indicates  that their diabetes is well controlled and a value  greater than or equal to 7% indicates suboptimal  control. A1c targets should be individualized based on  duration of diabetes, age, comorbid conditions, and  other  considerations. . Currently, no consensus exists regarding use of hemoglobin A1c for diagnosis of diabetes for children. .   . Mean Plasma Glucose 09/21/2017 157  (calc) Final  . eAG (mmol/L) 09/21/2017 8.7  (calc) Final  . PSA 09/21/2017 <0.1  < OR = 4.0 ng/mL Final   Comment: The total PSA value from this assay system is  standardized against the WHO standard. The test  result will be approximately 20% lower when compared  to the equimolar-standardized total PSA (Beckman  Coulter). Comparison of serial PSA results should be  interpreted with this fact in mind. . This test was performed using the Siemens  chemiluminescent  method. Values obtained from  different assay methods cannot be used interchangeably. PSA levels, regardless of value, should not be interpreted as absolute evidence of the presence or absence of disease.    Immunization History  Administered Date(s) Administered  . Influenza Split 02/22/2013, 02/22/2014  . Influenza-Unspecified 02/19/2015, 02/13/2016, 02/22/2017  . PPD Test 09/13/2012, 08/02/2013, 08/20/2014, 09/03/2015, 09/21/2016  . Pneumococcal Polysaccharide-23 05/11/2014  . Tdap 09/10/2010  . Zoster 09/13/2012    The patient's hemoglobin A1c is acceptable at 7.1.  However he is requesting assistance with weight loss.  He also does not like the urinary frequency that Invokana causes.  We had a discussion today about his options including Actos, making no changes, or switching to Trulicity.  The patient is interested in Trulicity. Past Medical History:  Diagnosis Date  . Cancer [facility]) 2004   Prostate CA with surgery  . Diabetes mellitus without complication ([place])   . Gout   . Hyperlipidemia   . Prostate cancer [facility])    Past Surgical History:  Procedure Laterality Date  . PROSTATE SURGERY     Current Outpatient Medications on File Prior to Visit  Medication Sig Dispense Refill  . allopurinol (ZYLOPRIM) 100 MG tablet TAKE 3 TABLETS BY MOUTH EVERY  DAY 270 tablet 1  . aspirin 81 MG tablet Take 81 mg by mouth daily.    . B-D UF III MINI PEN NEEDLES 31G X 5 MM MISC USE AS DIRECTED TWICE A DAY 100 each 3  . Blood Glucose Monitoring Suppl (ONE TOUCH ULTRA 2) W/DEVICE KIT As driected 1 each 0  . glucose blood (ONE TOUCH ULTRA TEST) test strip CHECK SUGAR 4-5 TIMES A DAY.EVERY MORNING AFTER MEALS AND AT BEDTIME 200 each 8  . insulin lispro (HUMALOG KWIKPEN) 100 UNIT/ML KiwkPen Inject 0.16 mLs (16 Units total) into the skin 3 (three) times daily. 15 pen 3  . Insulin Pen Needle 31G X 6 MM MISC Use with Levemir Flextouch Pen 100 each 0  . INVOKANA 300 MG TABS tablet TAKE 1 TABLET BY MOUTH EVERY DAY BEFORE BREAKFAST 30 tablet 2  . LEVEMIR FLEXTOUCH 100 UNIT/ML Pen INJECT 30 UNITS INTO THE SKIN DAILY AT 10 PM. 45 pen 1  . ONE TOUCH ULTRA TEST test strip CHECK SUGAR 4-5 TIMES A DAY. EVERY MORNING AFTER MEALS & AT BEDTIME 200 each 5  . rosuvastatin (CRESTOR) 40 MG tablet TAKE 1 TABLET BY MOUTH EVERY DAY 90 tablet 0  . VIAGRA 100 MG tablet TAKE 0.5-1 TABLETS (50-100 MG TOTAL) BY MOUTH DAILY AS NEEDED FOR ERECTILE DYSFUNCTION. 5 tablet 0  . NOVOLOG FLEXPEN 100 UNIT/ML FlexPen INJECT 16 UNITS INTO THE SKIN 3 TIMES DAILY WITH MEALS. (Patient not taking: Reported on 09/24/2017) 15 pen 0   No current facility-administered medications on file prior to visit.    No Known Allergies Social History   Socioeconomic History  . Marital status: Married    Spouse name: Not on file  . Number of children: Not on file  . Years of education: Not on file  . Highest education level: Not on file  Occupational History  . Not on file  Social Needs  . Financial resource strain: Not on file  . Food insecurity:    Worry: Not on file    Inability: Not on file  . Transportation needs:    Medical: Not on file    Non-medical: Not on file  Tobacco Use  . Smoking status: Never Smoker  . Smokeless tobacco: Never Used  Substance and Sexual Activity  . Alcohol use: Yes     Comment: Beer prior to diagnosis of diabetes, about once a week during sports on tv  . Drug use: No  . Sexual activity: Not Currently  Lifestyle  . Physical activity:    Days per week: Not on file    Minutes per session: Not on file  . Stress: Not on file  Relationships  . Social connections:    Talks on phone: Not on file    Gets together: Not on file    Attends religious service: Not on file    Active member of club or organization: Not on file    Attends meetings of clubs or organizations: Not on file    Relationship status: Not on file  . Intimate partner violence:    Fear of current or ex partner: Not on file    Emotionally abused: Not on file    Physically abused: Not on file    Forced sexual activity: Not on file  Other Topics Concern  . Not on file  Social History Narrative  . Not on file   Family History  Problem Relation Age of Onset  . Cancer Brother        prostate  . Cancer Mother        breast ca     Review of Systems  All other systems reviewed and are negative.      Objective:   Physical Exam  Constitutional: He is oriented to person, place, and time. He appears well-developed and well-nourished. No distress.  HENT:  Head: Normocephalic and atraumatic.  Right Ear: External ear normal.  Left Ear: External ear normal.  Nose: Nose normal.  Mouth/Throat: Oropharynx is clear and moist. No oropharyngeal exudate.  Eyes: Pupils are equal, round, and reactive to light. Conjunctivae and EOM are normal. Right eye exhibits no discharge. Left eye exhibits no discharge. No scleral icterus.  Neck: Normal range of motion. Neck supple. No JVD present. No tracheal deviation present. No thyromegaly present.  Cardiovascular: Normal rate, regular rhythm, normal heart sounds and intact distal pulses. Exam reveals no gallop and no friction rub.  No murmur heard. Pulmonary/Chest: Effort normal and breath sounds normal. No stridor. No respiratory distress. He has no  wheezes. He has no rales. He exhibits no tenderness.  Abdominal: Soft. Bowel sounds are normal. He exhibits no distension and no mass. There is no tenderness. There is no rebound and no guarding. No hernia.  Musculoskeletal: Normal range of motion. He exhibits no edema, tenderness or deformity.  Lymphadenopathy:    He has no cervical adenopathy.  Neurological: He is alert and oriented to person, place, and time. He displays normal reflexes. No cranial nerve deficit or sensory deficit. He exhibits normal muscle tone. Coordination normal.  Skin: Skin is warm. No rash noted. He is not diaphoretic. No erythema. No pallor.  Psychiatric: He has a normal mood and affect. His behavior is normal. Judgment and thought content normal.  Vitals reviewed.         Assessment & Plan:  Routine general medical examination at a health care facility  Controlled type 2 diabetes mellitus without complication, with long-term current use of insulin ([place])  Pure hypercholesterolemia  CKD (chronic kidney disease), stage III (HCC)  Malignant neoplasm of prostate ([place])  Colonoscopy is up-to-date.  PSA is undetectable.  Therefore cancer screening is up-to-date.  Patient received Prevnar 13.  Otherwise his immunizations are up-to-date.  Diabetes is adequately controlled with  a hemoglobin A1c of 7.1.  However together we decided to discontinue Invokana and replace it with Trulicity.  I gave the patient samples of 0.75 mg weekly.  He will try this once a week for 2 weeks and then call us back and let us know how he is tolerating the medication.  If tolerable, we will then switch the patient to 1.5 mg weekly.  Hopefully this will facilitate weight loss and help manage his diabetes as well as decrease the polyuria he is experiencing on the Invokana.  I did recommend reducing his dose of insulin to 25 units a day to avoid hypoglycemia.  He is not taking fast acting insulin although is listed on his record.  He states that he  has not used Humalog in more than a year.

## 2017-09-24 NOTE — Addendum Note (Signed)
Addended by: Shary Decamp B on: 09/24/2017 10:33 AM   Modules accepted: Orders

## 2017-09-27 LAB — HEPATITIS PANEL, ACUTE
HEP B C IGM: NONREACTIVE
HEP C AB: NONREACTIVE
Hep A IgM: NONREACTIVE
Hepatitis B Surface Ag: NONREACTIVE
SIGNAL TO CUT-OFF: 0.06 (ref <1.00)

## 2017-09-27 LAB — COMPREHENSIVE METABOLIC PANEL
AG RATIO: 1.1 (calc) (ref 1.0–2.5)
ALBUMIN MSPROF: 4.1 g/dL (ref 3.6–5.1)
ALKALINE PHOSPHATASE (APISO): 67 U/L (ref 40–115)
ALT: 9 U/L (ref 9–46)
AST: 13 U/L (ref 10–35)
BILIRUBIN TOTAL: 0.3 mg/dL (ref 0.2–1.2)
BUN/Creatinine Ratio: 13 (calc) (ref 6–22)
BUN: 19 mg/dL (ref 7–25)
CALCIUM: 9.6 mg/dL (ref 8.6–10.3)
CHLORIDE: 106 mmol/L (ref 98–110)
CO2: 25 mmol/L (ref 20–32)
Creat: 1.43 mg/dL — ABNORMAL HIGH (ref 0.70–1.25)
GLOBULIN: 3.6 g/dL (ref 1.9–3.7)
Glucose, Bld: 96 mg/dL (ref 65–99)
POTASSIUM: 4.1 mmol/L (ref 3.5–5.3)
Sodium: 141 mmol/L (ref 135–146)
Total Protein: 7.7 g/dL (ref 6.1–8.1)

## 2017-09-27 LAB — TEST AUTHORIZATION

## 2017-09-27 LAB — CBC WITH DIFFERENTIAL/PLATELET
BASOS PCT: 2.4 %
Basophils Absolute: 108 cells/uL (ref 0–200)
Eosinophils Absolute: 189 cells/uL (ref 15–500)
Eosinophils Relative: 4.2 %
HEMATOCRIT: 40.4 % (ref 38.5–50.0)
Hemoglobin: 13.3 g/dL (ref 13.2–17.1)
LYMPHS ABS: 1535 {cells}/uL (ref 850–3900)
MCH: 27.6 pg (ref 27.0–33.0)
MCHC: 32.9 g/dL (ref 32.0–36.0)
MCV: 83.8 fL (ref 80.0–100.0)
MPV: 10 fL (ref 7.5–12.5)
Monocytes Relative: 9.4 %
NEUTROS PCT: 49.9 %
Neutro Abs: 2246 cells/uL (ref 1500–7800)
Platelets: 275 10*3/uL (ref 140–400)
RBC: 4.82 10*6/uL (ref 4.20–5.80)
RDW: 14.1 % (ref 11.0–15.0)
Total Lymphocyte: 34.1 %
WBC: 4.5 10*3/uL (ref 3.8–10.8)
WBCMIX: 423 {cells}/uL (ref 200–950)

## 2017-09-27 LAB — PSA: PSA: <0.1 ng/mL (ref <=4.0)

## 2017-09-27 LAB — LIPID PANEL
CHOLESTEROL: 140 mg/dL (ref <200)
HDL: 47 mg/dL (ref >40)
LDL Cholesterol (Calc): 74 mg/dL (calc)
Non-HDL Cholesterol (Calc): 93 mg/dL (calc) (ref <130)
Total CHOL/HDL Ratio: 3 (calc) (ref <5.0)
Triglycerides: 100 mg/dL (ref <150)

## 2017-09-27 LAB — HEMOGLOBIN A1C
EAG (MMOL/L): 8.7 (calc)
Hgb A1c MFr Bld: 7.1 % of total Hgb — ABNORMAL HIGH (ref <5.7)
MEAN PLASMA GLUCOSE: 157 (calc)

## 2017-10-07 ENCOUNTER — Other Ambulatory Visit: Payer: Self-pay | Admitting: Family Medicine

## 2017-10-07 MED ORDER — DULAGLUTIDE 1.5 MG/0.5ML ~~LOC~~ SOAJ: 1.5000 mg | SUBCUTANEOUS | 3 refills | Status: DC

## 2017-10-08 ENCOUNTER — Other Ambulatory Visit: Payer: Self-pay | Admitting: Family Medicine

## 2017-10-08 MED ORDER — ONETOUCH ULTRA 2 W/DEVICE KIT: PACK | 0 refills | Status: DC

## 2017-10-12 ENCOUNTER — Other Ambulatory Visit: Payer: Self-pay | Admitting: Family Medicine

## 2017-10-19 DIAGNOSIS — E119 Type 2 diabetes mellitus without complications: Secondary | ICD-10-CM | POA: Diagnosis not present

## 2017-10-19 DIAGNOSIS — H52223 Regular astigmatism, bilateral: Secondary | ICD-10-CM | POA: Diagnosis not present

## 2017-10-19 DIAGNOSIS — H43811 Vitreous degeneration, right eye: Secondary | ICD-10-CM | POA: Diagnosis not present

## 2017-10-19 DIAGNOSIS — H2513 Age-related nuclear cataract, bilateral: Secondary | ICD-10-CM | POA: Diagnosis not present

## 2017-10-19 LAB — HM DIABETES EYE EXAM

## 2017-10-20 ENCOUNTER — Encounter: Payer: Self-pay | Admitting: Family Medicine

## 2017-11-29 ENCOUNTER — Other Ambulatory Visit: Payer: Self-pay | Admitting: Family Medicine

## 2017-11-29 MED ORDER — ROSUVASTATIN CALCIUM 40 MG PO TABS: 40.0000 mg | ORAL_TABLET | Freq: Every day | ORAL | 0 refills | Status: DC

## 2018-01-26 ENCOUNTER — Other Ambulatory Visit: Payer: Self-pay | Admitting: Family Medicine

## 2018-02-24 ENCOUNTER — Other Ambulatory Visit: Payer: Self-pay | Admitting: Family Medicine

## 2018-02-25 ENCOUNTER — Ambulatory Visit (INDEPENDENT_AMBULATORY_CARE_PROVIDER_SITE_OTHER): Payer: Medicare Other | Admitting: Family Medicine

## 2018-02-25 ENCOUNTER — Encounter: Payer: Self-pay | Admitting: Family Medicine

## 2018-02-25 VITALS — BP 130/70 | HR 72 | Temp 98.3°F | Resp 16 | Ht 73.0 in | Wt 255.0 lb

## 2018-02-25 DIAGNOSIS — E119 Type 2 diabetes mellitus without complications: Secondary | ICD-10-CM

## 2018-02-25 DIAGNOSIS — N183 Chronic kidney disease, stage 3 unspecified: Secondary | ICD-10-CM

## 2018-02-25 DIAGNOSIS — E7800 Pure hypercholesterolemia, unspecified: Secondary | ICD-10-CM | POA: Diagnosis not present

## 2018-02-25 DIAGNOSIS — M791 Myalgia, unspecified site: Secondary | ICD-10-CM

## 2018-02-25 DIAGNOSIS — Z794 Long term (current) use of insulin: Secondary | ICD-10-CM | POA: Diagnosis not present

## 2018-02-25 NOTE — Progress Notes (Signed)
Subjective:    Patient ID: Curtis Hunt, male    DOB: 11/18/1950, 67 y.o.   MRN: [id number]  HPI Patient is here today for follow-up of his diabetes mellitus.  Since the addition of Trulicity, he states that his blood sugars are outstanding.  Fasting blood sugars are typically between 70 and 100.  2-hour postprandial sugars are typically 140-150.  He states that he has not seen a single sugar above 200s since starting the medication.  He denies any polyuria, polydipsia, or blurry vision.  He denies any hypoglycemia.  He denies any chest pain shortness of breath or dyspnea on exertion.  He is diabetic eye exam is up-to-date.  His flu shot is up-to-date as well.  He denies any neuropathy in his feet.  His only concern or muscle aches.  He states that his proximal quadriceps feel weak and tired when he stands up.  They also ache at times.  They tend to get better after he walks on them for a while.  He denies any claudication.  He denies any numbness or tingling in his extremities.  He also complains of some occasional shoulder pains however the pain in his thighs tends to occur every day Past Medical History:  Diagnosis Date  . Cancer [facility]) 2004   Prostate CA with surgery  . Diabetes mellitus without complication ([place])   . Gout   . Hyperlipidemia   . Prostate cancer [facility])    Past Surgical History:  Procedure Laterality Date  . PROSTATE SURGERY     Current Outpatient Medications on File Prior to Visit  Medication Sig Dispense Refill  . allopurinol (ZYLOPRIM) 100 MG tablet TAKE 3 TABLETS BY MOUTH EVERY DAY 270 tablet 1  . aspirin 81 MG tablet Take 81 mg by mouth daily.    . B-D UF III MINI PEN NEEDLES 31G X 5 MM MISC USE AS DIRECTED TWICE A DAY 100 each 3  . Blood Glucose Monitoring Suppl (ONE TOUCH ULTRA 2) w/Device KIT As driected 1 each 0  . glucose blood (ONE TOUCH ULTRA TEST) test strip CHECK SUGAR 4-5 TIMES A DAY.EVERY MORNING AFTER MEALS AND AT BEDTIME 200 each 8  . Insulin Pen  Needle 31G X 6 MM MISC Use with Levemir Flextouch Pen 100 each 0  . LEVEMIR FLEXTOUCH 100 UNIT/ML Pen INJECT 30 UNITS INTO THE SKIN DAILY AT 10 PM. (Patient taking differently: Inject 25 Units into the skin daily at 10 pm. ) 45 pen 1  . ONE TOUCH ULTRA TEST test strip CHECK SUGAR 4-5 TIMES A DAY. EVERY MORNING AFTER MEALS & AT BEDTIME 200 each 5  . rosuvastatin (CRESTOR) 40 MG tablet TAKE 1 TABLET BY MOUTH EVERY DAY 90 tablet 1  . TRULICITY 1.5 CX/4.4YJ SOPN INJECT 1.5 MG INTO THE SKIN ONCE A WEEK 4 pen 3  . VIAGRA 100 MG tablet TAKE 0.5-1 TABLETS (50-100 MG TOTAL) BY MOUTH DAILY AS NEEDED FOR ERECTILE DYSFUNCTION. 5 tablet 0   No current facility-administered medications on file prior to visit.    No Known Allergies Social History   Socioeconomic History  . Marital status: Married    Spouse name: Not on file  . Number of children: Not on file  . Years of education: Not on file  . Highest education level: Not on file  Occupational History  . Not on file  Social Needs  . Financial resource strain: Not on file  . Food insecurity:    Worry: Not on file  Inability: Not on file  . Transportation needs:    Medical: Not on file    Non-medical: Not on file  Tobacco Use  . Smoking status: Never Smoker  . Smokeless tobacco: Never Used  Substance and Sexual Activity  . Alcohol use: Yes    Comment: Beer prior to diagnosis of diabetes, about once a week during sports on tv  . Drug use: No  . Sexual activity: Not Currently  Lifestyle  . Physical activity:    Days per week: Not on file    Minutes per session: Not on file  . Stress: Not on file  Relationships  . Social connections:    Talks on phone: Not on file    Gets together: Not on file    Attends religious service: Not on file    Active member of club or organization: Not on file    Attends meetings of clubs or organizations: Not on file    Relationship status: Not on file  . Intimate partner violence:    Fear of current or  ex partner: Not on file    Emotionally abused: Not on file    Physically abused: Not on file    Forced sexual activity: Not on file  Other Topics Concern  . Not on file  Social History Narrative  . Not on file   Family History  Problem Relation Age of Onset  . Cancer Brother        prostate  . Cancer Mother        breast ca     Review of Systems  All other systems reviewed and are negative.      Objective:   Physical Exam  Constitutional: He is oriented to person, place, and time. He appears well-developed and well-nourished. No distress.  HENT:  Head: Normocephalic and atraumatic.  Right Ear: External ear normal.  Left Ear: External ear normal.  Nose: Nose normal.  Mouth/Throat: Oropharynx is clear and moist. No oropharyngeal exudate.  Eyes: Pupils are equal, round, and reactive to light. Conjunctivae and EOM are normal. Right eye exhibits no discharge. Left eye exhibits no discharge. No scleral icterus.  Neck: Normal range of motion. Neck supple. No JVD present. No tracheal deviation present. No thyromegaly present.  Cardiovascular: Normal rate, regular rhythm, normal heart sounds and intact distal pulses. Exam reveals no gallop and no friction rub.  No murmur heard. Pulmonary/Chest: Effort normal and breath sounds normal. No stridor. No respiratory distress. He has no wheezes. He has no rales. He exhibits no tenderness.  Abdominal: Soft. Bowel sounds are normal. He exhibits no distension and no mass. There is no tenderness. There is no rebound and no guarding. No hernia.  Musculoskeletal: Normal range of motion. He exhibits no edema, tenderness or deformity.  Lymphadenopathy:    He has no cervical adenopathy.  Neurological: He is alert and oriented to person, place, and time. He displays normal reflexes. No cranial nerve deficit or sensory deficit. He exhibits normal muscle tone. Coordination normal.  Skin: Skin is warm. No rash noted. He is not diaphoretic. No erythema.  No pallor.  Psychiatric: He has a normal mood and affect. His behavior is normal. Judgment and thought content normal.  Vitals reviewed.         Assessment & Plan:  Controlled type 2 diabetes mellitus without complication, with long-term current use of insulin ([place]) - Plan: CBC with Differential/Platelet, COMPLETE METABOLIC PANEL WITH GFR, Lipid panel, Hemoglobin A1c, Microalbumin, urine  Pure hypercholesterolemia  CKD (chronic kidney disease), stage III (HCC)  Myalgia - Plan: CK  I am very happy with the patient's reported blood sugars.  Check hemoglobin A1c.  If less than 7, we will make no changes in his medication at this time.  His blood pressure today is well controlled.  Diabetic foot exam is performed today and is normal.  Immunizations are up-to-date.  Diabetic eye exam is up-to-date.  I will check a CK level.  If the CK level is elevated, we may need to discontinue or switch Crestor to avoid pain in his thighs.  If the CK level is normal, I would recommend increasing exercise to improve muscular stamina in his legs.  Check a fasting lipid panel.  Goal LDL cholesterol is less than 100.

## 2018-02-26 LAB — LIPID PANEL
CHOLESTEROL: 142 mg/dL (ref <200)
HDL: 43 mg/dL (ref >40)
LDL Cholesterol (Calc): 71 mg/dL (calc)
Non-HDL Cholesterol (Calc): 99 mg/dL (calc) (ref <130)
TRIGLYCERIDES: 221 mg/dL — AB (ref <150)
Total CHOL/HDL Ratio: 3.3 (calc) (ref <5.0)

## 2018-02-26 LAB — COMPLETE METABOLIC PANEL WITH GFR
AG Ratio: 1.2 (calc) (ref 1.0–2.5)
ALKALINE PHOSPHATASE (APISO): 62 U/L (ref 40–115)
ALT: 9 U/L (ref 9–46)
AST: 14 U/L (ref 10–35)
Albumin: 3.9 g/dL (ref 3.6–5.1)
BILIRUBIN TOTAL: 0.2 mg/dL (ref 0.2–1.2)
BUN: 19 mg/dL (ref 7–25)
CHLORIDE: 106 mmol/L (ref 98–110)
CO2: 24 mmol/L (ref 20–32)
Calcium: 9.2 mg/dL (ref 8.6–10.3)
Creat: 1.24 mg/dL (ref 0.70–1.25)
GFR, EST AFRICAN AMERICAN: 69 mL/min/{1.73_m2} (ref >=60)
GFR, Est Non African American: 59 mL/min/{1.73_m2} — ABNORMAL LOW (ref >=60)
GLUCOSE: 106 mg/dL — AB (ref 65–99)
Globulin: 3.2 g/dL (calc) (ref 1.9–3.7)
POTASSIUM: 4 mmol/L (ref 3.5–5.3)
Sodium: 140 mmol/L (ref 135–146)
TOTAL PROTEIN: 7.1 g/dL (ref 6.1–8.1)

## 2018-02-26 LAB — CBC WITH DIFFERENTIAL/PLATELET
BASOS ABS: 88 {cells}/uL (ref 0–200)
Basophils Relative: 1.7 %
EOS ABS: 260 {cells}/uL (ref 15–500)
Eosinophils Relative: 5 %
HCT: 37 % — ABNORMAL LOW (ref 38.5–50.0)
Hemoglobin: 12.2 g/dL — ABNORMAL LOW (ref 13.2–17.1)
Lymphs Abs: 1898 cells/uL (ref 850–3900)
MCH: 28.3 pg (ref 27.0–33.0)
MCHC: 33 g/dL (ref 32.0–36.0)
MCV: 85.8 fL (ref 80.0–100.0)
MONOS PCT: 9.7 %
MPV: 10.5 fL (ref 7.5–12.5)
NEUTROS ABS: 2449 {cells}/uL (ref 1500–7800)
NEUTROS PCT: 47.1 %
PLATELETS: 266 10*3/uL (ref 140–400)
RBC: 4.31 10*6/uL (ref 4.20–5.80)
RDW: 14.4 % (ref 11.0–15.0)
TOTAL LYMPHOCYTE: 36.5 %
WBC mixed population: 504 cells/uL (ref 200–950)
WBC: 5.2 10*3/uL (ref 3.8–10.8)

## 2018-02-26 LAB — CK: CK TOTAL: 139 U/L (ref 44–196)

## 2018-02-26 LAB — HEMOGLOBIN A1C
HEMOGLOBIN A1C: 6.3 %{Hb} — AB (ref <5.7)
Mean Plasma Glucose: 134 (calc)
eAG (mmol/L): 7.4 (calc)

## 2018-02-26 LAB — MICROALBUMIN, URINE: Microalb, Ur: 1.7 mg/dL

## 2018-05-12 ENCOUNTER — Other Ambulatory Visit: Payer: Self-pay | Admitting: Family Medicine

## 2018-05-20 DIAGNOSIS — R825 Elevated urine levels of drugs, medicaments and biological substances: Secondary | ICD-10-CM | POA: Diagnosis not present

## 2018-06-20 ENCOUNTER — Other Ambulatory Visit: Payer: Self-pay | Admitting: *Deleted

## 2018-06-20 MED ORDER — GLUCOSE BLOOD VI STRP: ORAL_STRIP | 5 refills | Status: DC

## 2018-07-08 ENCOUNTER — Ambulatory Visit: Payer: Medicare Other | Admitting: Podiatry

## 2018-07-27 ENCOUNTER — Ambulatory Visit: Payer: Medicare Other | Admitting: Podiatry

## 2018-07-27 ENCOUNTER — Encounter: Payer: Self-pay | Admitting: Podiatry

## 2018-07-27 DIAGNOSIS — M79675 Pain in left toe(s): Secondary | ICD-10-CM

## 2018-07-27 DIAGNOSIS — B351 Tinea unguium: Secondary | ICD-10-CM | POA: Diagnosis not present

## 2018-07-27 DIAGNOSIS — M79674 Pain in right toe(s): Secondary | ICD-10-CM

## 2018-07-27 DIAGNOSIS — E119 Type 2 diabetes mellitus without complications: Secondary | ICD-10-CM | POA: Diagnosis not present

## 2018-07-27 NOTE — Progress Notes (Signed)
This patient presents to the office with chief complaint of long thick nails and diabetic feet.  This patient  says there  is  no pain and discomfort in his  feet.  This patient says there are long thick painful nails.  These nails are painful walking and wearing shoes.  Patient has no history of infection or drainage from both feet.  Patient is unable to  self treat his own nails . This patient presents  to the office today for treatment of the  long nails and a foot evaluation due to history of  diabetes.  General Appearance  Alert, conversant and in no acute stress.  Vascular  Dorsalis pedis and posterior tibial  pulses are palpable  bilaterally.  Capillary return is within normal limits  bilaterally. Temperature is within normal limits  bilaterally.  Neurologic  Senn-Weinstein monofilament wire test within normal limits  bilaterally. Muscle power within normal limits bilaterally.  Nails Thick disfigured discolored nails with subungual debris  from hallux to fifth toes bilaterally. No evidence of bacterial infection or drainage bilaterally.  Orthopedic  No limitations of motion of motion feet .  No crepitus or effusions noted.  DJD 1st MPJ  Right greater than left.  Skin  normotropic skin with no porokeratosis noted bilaterally.  No signs of infections or ulcers noted.     Onychomycosis  Diabetes with no foot complications  IE  Debride nails x 10.  A diabetic foot exam was performed and there is no evidence of any vascular or neurologic pathology.   RTC 3 months.   Gardiner Barefoot DPM

## 2018-08-05 ENCOUNTER — Other Ambulatory Visit: Payer: Self-pay | Admitting: *Deleted

## 2018-08-05 MED ORDER — ALLOPURINOL 100 MG PO TABS: 300.0000 mg | ORAL_TABLET | Freq: Every day | ORAL | 1 refills | Status: DC

## 2018-08-26 ENCOUNTER — Other Ambulatory Visit: Payer: Self-pay | Admitting: Family Medicine

## 2018-09-08 ENCOUNTER — Other Ambulatory Visit: Payer: Self-pay | Admitting: Family Medicine

## 2018-09-22 ENCOUNTER — Other Ambulatory Visit: Payer: Self-pay | Admitting: Family Medicine

## 2018-09-22 MED ORDER — ROSUVASTATIN CALCIUM 40 MG PO TABS: 40.0000 mg | ORAL_TABLET | Freq: Every day | ORAL | 1 refills | Status: DC

## 2018-09-22 MED ORDER — INSULIN DETEMIR 100 UNIT/ML FLEXPEN: 30.0000 [IU] | PEN_INJECTOR | Freq: Every day | SUBCUTANEOUS | 1 refills | Status: DC

## 2018-09-22 NOTE — Addendum Note (Signed)
Addended by: Shary Decamp B on: 09/22/2018 11:46 AM   Modules accepted: Orders

## 2018-10-28 ENCOUNTER — Encounter: Payer: Self-pay | Admitting: Podiatry

## 2018-10-28 ENCOUNTER — Other Ambulatory Visit: Payer: Self-pay

## 2018-10-28 ENCOUNTER — Ambulatory Visit: Payer: Medicare Other | Admitting: Podiatry

## 2018-10-28 VITALS — Temp 97.7°F

## 2018-10-28 DIAGNOSIS — M79674 Pain in right toe(s): Secondary | ICD-10-CM

## 2018-10-28 DIAGNOSIS — B351 Tinea unguium: Secondary | ICD-10-CM | POA: Diagnosis not present

## 2018-10-28 DIAGNOSIS — E119 Type 2 diabetes mellitus without complications: Secondary | ICD-10-CM | POA: Diagnosis not present

## 2018-10-28 DIAGNOSIS — M79675 Pain in left toe(s): Secondary | ICD-10-CM | POA: Diagnosis not present

## 2018-10-28 NOTE — Progress Notes (Signed)
Complaint:  Visit Type: Patient returns to my office for continued preventative foot care services. Complaint: Patient states" my nails have grown long and thick and become painful to walk and wear shoes" Patient has been diagnosed with DM with no foot complications. The patient presents for preventative foot care services. No changes to ROS  Podiatric Exam: Vascular: dorsalis pedis and posterior tibial pulses are palpable bilateral. Capillary return is immediate. Temperature gradient is WNL. Skin turgor WNL  Sensorium: Normal Semmes Weinstein monofilament test. Normal tactile sensation bilaterally. Nail Exam: Pt has thick disfigured discolored nails with subungual debris noted bilateral entire nail hallux through fifth toenails Ulcer Exam: There is no evidence of ulcer or pre-ulcerative changes or infection. Orthopedic Exam: Muscle tone and strength are WNL. No limitations in general ROM. No crepitus or effusions noted. Foot type and digits show no abnormalities. Bony prominences are unremarkable. Skin: No Porokeratosis. No infection or ulcers  Diagnosis:  Onychomycosis, , Pain in right toe, pain in left toes  Treatment & Plan Procedures and Treatment: Consent by patient was obtained for treatment procedures.   Debridement of mycotic and hypertrophic toenails, 1 through 5 bilateral and clearing of subungual debris. No ulceration, no infection noted.  Return Visit-Office Procedure: Patient instructed to return to the office for a follow up visit 3 months for continued evaluation and treatment.    Michah Minton DPM 

## 2018-12-01 DIAGNOSIS — Z86010 Personal history of colonic polyps: Secondary | ICD-10-CM | POA: Diagnosis not present

## 2018-12-01 DIAGNOSIS — R194 Change in bowel habit: Secondary | ICD-10-CM | POA: Diagnosis not present

## 2018-12-01 DIAGNOSIS — Z1211 Encounter for screening for malignant neoplasm of colon: Secondary | ICD-10-CM | POA: Diagnosis not present

## 2018-12-01 DIAGNOSIS — K5730 Diverticulosis of large intestine without perforation or abscess without bleeding: Secondary | ICD-10-CM | POA: Diagnosis not present

## 2019-01-02 DIAGNOSIS — K635 Polyp of colon: Secondary | ICD-10-CM | POA: Diagnosis not present

## 2019-01-02 DIAGNOSIS — Z86010 Personal history of colonic polyps: Secondary | ICD-10-CM | POA: Diagnosis not present

## 2019-01-02 DIAGNOSIS — D122 Benign neoplasm of ascending colon: Secondary | ICD-10-CM | POA: Diagnosis not present

## 2019-01-02 DIAGNOSIS — K5730 Diverticulosis of large intestine without perforation or abscess without bleeding: Secondary | ICD-10-CM | POA: Diagnosis not present

## 2019-01-02 DIAGNOSIS — Z1211 Encounter for screening for malignant neoplasm of colon: Secondary | ICD-10-CM | POA: Diagnosis not present

## 2019-01-17 ENCOUNTER — Other Ambulatory Visit: Payer: Self-pay

## 2019-01-17 ENCOUNTER — Other Ambulatory Visit: Payer: Medicare Other

## 2019-01-17 DIAGNOSIS — Z794 Long term (current) use of insulin: Secondary | ICD-10-CM | POA: Diagnosis not present

## 2019-01-17 DIAGNOSIS — E119 Type 2 diabetes mellitus without complications: Secondary | ICD-10-CM | POA: Diagnosis not present

## 2019-01-17 DIAGNOSIS — Z0000 Encounter for general adult medical examination without abnormal findings: Secondary | ICD-10-CM

## 2019-01-17 DIAGNOSIS — Z111 Encounter for screening for respiratory tuberculosis: Secondary | ICD-10-CM

## 2019-01-17 DIAGNOSIS — E7800 Pure hypercholesterolemia, unspecified: Secondary | ICD-10-CM | POA: Diagnosis not present

## 2019-01-17 DIAGNOSIS — Z125 Encounter for screening for malignant neoplasm of prostate: Secondary | ICD-10-CM

## 2019-01-18 LAB — COMPREHENSIVE METABOLIC PANEL
AG Ratio: 1.2 (calc) (ref 1.0–2.5)
ALT: 11 U/L (ref 9–46)
AST: 14 U/L (ref 10–35)
Albumin: 4.1 g/dL (ref 3.6–5.1)
Alkaline phosphatase (APISO): 63 U/L (ref 35–144)
BUN/Creatinine Ratio: 12 (calc) (ref 6–22)
BUN: 16 mg/dL (ref 7–25)
CO2: 26 mmol/L (ref 20–32)
Calcium: 9.7 mg/dL (ref 8.6–10.3)
Chloride: 103 mmol/L (ref 98–110)
Creat: 1.32 mg/dL — ABNORMAL HIGH (ref 0.70–1.25)
Globulin: 3.4 g/dL (calc) (ref 1.9–3.7)
Glucose, Bld: 93 mg/dL (ref 65–99)
Potassium: 4.1 mmol/L (ref 3.5–5.3)
Sodium: 138 mmol/L (ref 135–146)
Total Bilirubin: 0.3 mg/dL (ref 0.2–1.2)
Total Protein: 7.5 g/dL (ref 6.1–8.1)

## 2019-01-18 LAB — CBC WITH DIFFERENTIAL/PLATELET
Absolute Monocytes: 486 cells/uL (ref 200–950)
Basophils Absolute: 108 cells/uL (ref 0–200)
Basophils Relative: 1.8 %
Eosinophils Absolute: 204 cells/uL (ref 15–500)
Eosinophils Relative: 3.4 %
HCT: 38.4 % — ABNORMAL LOW (ref 38.5–50.0)
Hemoglobin: 12.5 g/dL — ABNORMAL LOW (ref 13.2–17.1)
Lymphs Abs: 2058 cells/uL (ref 850–3900)
MCH: 28.5 pg (ref 27.0–33.0)
MCHC: 32.6 g/dL (ref 32.0–36.0)
MCV: 87.7 fL (ref 80.0–100.0)
MPV: 10.3 fL (ref 7.5–12.5)
Monocytes Relative: 8.1 %
Neutro Abs: 3144 cells/uL (ref 1500–7800)
Neutrophils Relative %: 52.4 %
Platelets: 289 10*3/uL (ref 140–400)
RBC: 4.38 10*6/uL (ref 4.20–5.80)
RDW: 14.5 % (ref 11.0–15.0)
Total Lymphocyte: 34.3 %
WBC: 6 10*3/uL (ref 3.8–10.8)

## 2019-01-18 LAB — LIPID PANEL
Cholesterol: 133 mg/dL (ref <200)
HDL: 44 mg/dL (ref >=40)
LDL Cholesterol (Calc): 67 mg/dL (calc)
Non-HDL Cholesterol (Calc): 89 mg/dL (calc) (ref <130)
Total CHOL/HDL Ratio: 3 (calc) (ref <5.0)
Triglycerides: 139 mg/dL (ref <150)

## 2019-01-18 LAB — MICROALBUMIN / CREATININE URINE RATIO
Creatinine, Urine: 210 mg/dL (ref 20–320)
Microalb Creat Ratio: 29 mcg/mg creat (ref <30)
Microalb, Ur: 6 mg/dL

## 2019-01-18 LAB — PSA: PSA: <0.1 ng/mL (ref <=4.0)

## 2019-01-18 LAB — HEMOGLOBIN A1C
Hgb A1c MFr Bld: 6.4 % of total Hgb — ABNORMAL HIGH (ref <5.7)
Mean Plasma Glucose: 137 (calc)
eAG (mmol/L): 7.6 (calc)

## 2019-01-19 ENCOUNTER — Other Ambulatory Visit: Payer: Self-pay

## 2019-01-20 ENCOUNTER — Ambulatory Visit (INDEPENDENT_AMBULATORY_CARE_PROVIDER_SITE_OTHER): Payer: Medicare Other | Admitting: Family Medicine

## 2019-01-20 ENCOUNTER — Encounter: Payer: Self-pay | Admitting: Family Medicine

## 2019-01-20 VITALS — BP 120/74 | HR 80 | Temp 98.6°F | Resp 16 | Ht 73.0 in | Wt 255.0 lb

## 2019-01-20 DIAGNOSIS — Z0000 Encounter for general adult medical examination without abnormal findings: Secondary | ICD-10-CM

## 2019-01-20 DIAGNOSIS — E119 Type 2 diabetes mellitus without complications: Secondary | ICD-10-CM

## 2019-01-20 DIAGNOSIS — E7800 Pure hypercholesterolemia, unspecified: Secondary | ICD-10-CM | POA: Diagnosis not present

## 2019-01-20 DIAGNOSIS — N183 Chronic kidney disease, stage 3 unspecified: Secondary | ICD-10-CM

## 2019-01-20 DIAGNOSIS — Z0001 Encounter for general adult medical examination with abnormal findings: Secondary | ICD-10-CM | POA: Diagnosis not present

## 2019-01-20 DIAGNOSIS — Z794 Long term (current) use of insulin: Secondary | ICD-10-CM

## 2019-01-20 DIAGNOSIS — M791 Myalgia, unspecified site: Secondary | ICD-10-CM

## 2019-01-20 LAB — QUANTIFERON-TB GOLD PLUS
Mitogen-NIL: 8.85 IU/mL
NIL: 0.4 IU/mL
QuantiFERON-TB Gold Plus: NEGATIVE
TB1-NIL: <0 IU/mL
TB2-NIL: <0 IU/mL

## 2019-01-20 NOTE — Progress Notes (Signed)
Subjective:    Patient ID: Curtis Hunt, male    DOB: 09-03-1950, 68 y.o.   MRN: [id number]  HPI Patient is a very pleasant 68 year old African-American gentleman who presents today for complete physical exam.  His colonoscopy was just performed recently and did show 2 polyps that were benign.  He is due for repeat colonoscopy in 7 years.  He has a history of prostate cancer and has been greater than 15 years since he had a surgery to remove this.  His PSA remains undetectable.  I reviewed his immunization records and these are listed below: Immunization History  Administered Date(s) Administered  . Influenza Split 02/22/2013, 02/22/2014  . Influenza-Unspecified 02/19/2015, 02/13/2016, 02/22/2017  . PPD Test 09/13/2012, 08/02/2013, 08/20/2014, 09/03/2015, 09/21/2016  . Pneumococcal Conjugate-13 09/24/2017  . Pneumococcal Polysaccharide-23 05/11/2014  . Tdap 09/10/2010  . Zoster 09/13/2012   Patient is due for a flu shot but he would like to defer this for a month and get it for free at the city of [place] where he is retired [employer].  He is due for a booster on Pneumovax 23 but he defers this to his next appointment.  I did recommend shin Grix but I recommended he wait until after the current corona pandemic.  Patient denies any issues with depression, falls, or memory loss.  He does continue to complain of pain in his quadriceps.  They hurt from his hip to his knee anteriorly.  It makes it very difficult for him to stand from a seated position.  It makes it hard for him to get out of his truck or out of his Lucianne Lei.  He has a hard time standing from his chair to get on the exam table.  He denies any pain in his knee.  He denies any pain in his hip.  He denies any claudication or numbness or tingling.  He has excellent pulses at the femoral pulse, popliteal pulse, and dorsalis pedis pulse bilaterally.  Therefore I suspect that this is muscle pain.  He is on Crestor 40 mg a day. Lab on  01/17/2019  Component Date Value Ref Range Status  . Cholesterol 01/17/2019 133  <200 mg/dL Final  . HDL 01/17/2019 44  > OR = 40 mg/dL Final  . Triglycerides 01/17/2019 139  <150 mg/dL Final  . LDL Cholesterol (Calc) 01/17/2019 67  mg/dL (calc) Final   Comment: Reference range: <100 . Desirable range <100 mg/dL for primary prevention;   <70 mg/dL for patients with CHD or diabetic patients  with > or = 2 CHD risk factors. Marland Kitchen LDL-C is now calculated using the Martin-Hopkins  calculation, which is a validated novel method providing  better accuracy than the Friedewald equation in the  estimation of LDL-C.  Cresenciano Genre et al. Annamaria Helling. 2423;536(14): 2061-2068  (http://education.[facility].com/faq/FAQ164)   . Total CHOL/HDL Ratio 01/17/2019 3.0  <5.0 (calc) Final  . Non-HDL Cholesterol (Calc) 01/17/2019 89  <130 mg/dL (calc) Final   Comment: For patients with diabetes plus 1 major ASCVD risk  factor, treating to a non-HDL-C goal of <100 mg/dL  (LDL-C of <70 mg/dL) is considered a therapeutic  option.   . WBC 01/17/2019 6.0  3.8 - 10.8 Thousand/uL Final  . RBC 01/17/2019 4.38  4.20 - 5.80 Million/uL Final  . Hemoglobin 01/17/2019 12.5* 13.2 - 17.1 g/dL Final  . HCT 01/17/2019 38.4* 38.5 - 50.0 % Final  . MCV 01/17/2019 87.7  80.0 - 100.0 fL Final  . MCH 01/17/2019 28.5  27.0 -  33.0 pg Final  . MCHC 01/17/2019 32.6  32.0 - 36.0 g/dL Final  . RDW 01/17/2019 14.5  11.0 - 15.0 % Final  . Platelets 01/17/2019 289  140 - 400 Thousand/uL Final  . MPV 01/17/2019 10.3  7.5 - 12.5 fL Final  . Neutro Abs 01/17/2019 3,144  1,500 - 7,800 cells/uL Final  . Lymphs Abs 01/17/2019 2,058  850 - 3,900 cells/uL Final  . Absolute Monocytes 01/17/2019 486  200 - 950 cells/uL Final  . Eosinophils Absolute 01/17/2019 204  15 - 500 cells/uL Final  . Basophils Absolute 01/17/2019 108  0 - 200 cells/uL Final  . Neutrophils Relative % 01/17/2019 52.4  % Final  . Total Lymphocyte 01/17/2019 34.3  % Final  .  Monocytes Relative 01/17/2019 8.1  % Final  . Eosinophils Relative 01/17/2019 3.4  % Final  . Basophils Relative 01/17/2019 1.8  % Final  . Glucose, Bld 01/17/2019 93  65 - 99 mg/dL Final   Comment: .            Fasting reference interval .   . BUN 01/17/2019 16  7 - 25 mg/dL Final  . Creat 01/17/2019 1.32* 0.70 - 1.25 mg/dL Final   Comment: For patients >8 years of age, the reference limit for Creatinine is approximately 13% higher for people identified as African-American. .   Havery Moros Ratio 01/17/2019 12  6 - 22 (calc) Final  . Sodium 01/17/2019 138  135 - 146 mmol/L Final  . Potassium 01/17/2019 4.1  3.5 - 5.3 mmol/L Final  . Chloride 01/17/2019 103  98 - 110 mmol/L Final  . CO2 01/17/2019 26  20 - 32 mmol/L Final  . Calcium 01/17/2019 9.7  8.6 - 10.3 mg/dL Final  . Total Protein 01/17/2019 7.5  6.1 - 8.1 g/dL Final  . Albumin 01/17/2019 4.1  3.6 - 5.1 g/dL Final  . Globulin 01/17/2019 3.4  1.9 - 3.7 g/dL (calc) Final  . AG Ratio 01/17/2019 1.2  1.0 - 2.5 (calc) Final  . Total Bilirubin 01/17/2019 0.3  0.2 - 1.2 mg/dL Final  . Alkaline phosphatase (APISO) 01/17/2019 63  35 - 144 U/L Final  . AST 01/17/2019 14  10 - 35 U/L Final  . ALT 01/17/2019 11  9 - 46 U/L Final  . PSA 01/17/2019 <0.1  < OR = 4.0 ng/mL Final   Comment: The total PSA value from this assay system is  standardized against the WHO standard. The test  result will be approximately 20% lower when compared  to the equimolar-standardized total PSA (Beckman  Coulter). Comparison of serial PSA results should be  interpreted with this fact in mind. . This test was performed using the Siemens  chemiluminescent method. Values obtained from  different assay methods cannot be used interchangeably. PSA levels, regardless of value, should not be interpreted as absolute evidence of the presence or absence of disease.   . Hgb A1c MFr Bld 01/17/2019 6.4* <5.7 % of total Hgb Final   Comment: For someone  without known diabetes, a hemoglobin  A1c value between 5.7% and 6.4% is consistent with prediabetes and should be confirmed with a  follow-up test. . For someone with known diabetes, a value <7% indicates that their diabetes is well controlled. A1c targets should be individualized based on duration of diabetes, age, comorbid conditions, and other considerations. . This assay result is consistent with an increased risk of diabetes. . Currently, no consensus exists regarding use of hemoglobin A1c for diagnosis  of diabetes for children. .   . Mean Plasma Glucose 01/17/2019 137  (calc) Final  . eAG (mmol/L) 01/17/2019 7.6  (calc) Final  . Creatinine, Urine 01/17/2019 210  20 - 320 mg/dL Final  . Microalb, Ur 01/17/2019 6.0  mg/dL Final   Comment: Reference Range Not established   . Microalb Creat Ratio 01/17/2019 29  <30 mcg/mg creat Final   Comment: . The ADA defines abnormalities in albumin excretion as follows: Marland Kitchen Category         Result (mcg/mg creatinine) . Normal                    <30 Microalbuminuria         30-299  Clinical albuminuria   > OR = 300 . The ADA recommends that at least two of three specimens collected within a 3-6 month period be abnormal before considering a patient to be within a diagnostic category.   . QuantiFERON-TB Gold Plus 01/17/2019 NEGATIVE  NEGATIVE Final   Comment: Negative test result. M. tuberculosis complex  infection unlikely.   Marland Kitchen NIL 01/17/2019 0.40  IU/mL Final  . Mitogen-NIL 01/17/2019 8.85  IU/mL Final  . TB1-NIL 01/17/2019 <0.00  IU/mL Final  . TB2-NIL 01/17/2019 <0.00  IU/mL Final   Comment: . The Nil tube value reflects the background interferon gamma immune response of the patient's blood sample. This value has been subtracted from the patient's displayed TB and Mitogen results. . Lower than expected results with the Mitogen tube prevent false-negative Quantiferon readings by detecting a patient with a potential  immune suppressive condition and/or suboptimal pre-analytical specimen handling. . The TB1 Antigen tube is coated with the M. tuberculosis-specific antigens designed to elicit responses from TB antigen primed CD4+ helper T-lymphocytes. . The TB2 Antigen tube is coated with the M. tuberculosis-specific antigens designed to elicit responses from TB antigen primed CD4+ helper and CD8+ cytotoxic T-lymphocytes. . For additional information, please refer to https://education.[facility].com/faq/FAQ204 (This link is being provided for informational/ educational purposes only.) .    Immunization History  Administered Date(s) Administered  . Influenza Split 02/22/2013, 02/22/2014  . Influenza-Unspecified 02/19/2015, 02/13/2016, 02/22/2017  . PPD Test 09/13/2012, 08/02/2013, 08/20/2014, 09/03/2015, 09/21/2016  . Pneumococcal Conjugate-13 09/24/2017  . Pneumococcal Polysaccharide-23 05/11/2014  . Tdap 09/10/2010  . Zoster 09/13/2012    The patient's hemoglobin A1c is acceptable at 7.1.  However he is requesting assistance with weight loss.  He also does not like the urinary frequency that Invokana causes.  We had a discussion today about his options including Actos, making no changes, or switching to Trulicity.  The patient is interested in Trulicity. Past Medical History:  Diagnosis Date  . Cancer [facility]) 2004   Prostate CA with surgery  . Diabetes mellitus without complication ([place])   . Gout   . Hyperlipidemia   . Prostate cancer [facility])    Past Surgical History:  Procedure Laterality Date  . PROSTATE SURGERY     Current Outpatient Medications on File Prior to Visit  Medication Sig Dispense Refill  . allopurinol (ZYLOPRIM) 100 MG tablet Take 3 tablets (300 mg total) by mouth daily. 270 tablet 1  . aspirin 81 MG tablet Take 81 mg by mouth daily.    . B-D UF III MINI PEN NEEDLES 31G X 5 MM MISC USE AS DIRECTED TWICE A DAY 100 each 3  . Blood Glucose Monitoring Suppl (ONE  TOUCH ULTRA 2) w/Device KIT As driected 1 each 0  . glucose blood (ONE  TOUCH ULTRA TEST) test strip CHECK SUGAR 4-5 TIMES A DAY.EVERY MORNING AFTER MEALS AND AT BEDTIME 200 each 8  . glucose blood (ONE TOUCH ULTRA TEST) test strip CHECK SUGAR 4-5 TIMES A DAY. EVERY MORNING AFTER MEALS & AT BEDTIME. Dx: E11.65. 200 each 5  . Insulin Detemir (LEVEMIR FLEXTOUCH) 100 UNIT/ML Pen Inject 30 Units into the skin daily at 10 pm. (Patient taking differently: Inject 25 Units into the skin daily at 10 pm. ) 45 pen 1  . Insulin Pen Needle 31G X 6 MM MISC Use with Levemir Flextouch Pen 100 each 0  . rosuvastatin (CRESTOR) 40 MG tablet Take 1 tablet (40 mg total) by mouth daily. 90 tablet 1  . TRULICITY 1.5 KA/7.6OT SOPN INJECT 1.5 MG INTO THE SKIN ONCE A WEEK 4 pen 3  . VIAGRA 100 MG tablet TAKE 0.5-1 TABLETS (50-100 MG TOTAL) BY MOUTH DAILY AS NEEDED FOR ERECTILE DYSFUNCTION. 5 tablet 0   No current facility-administered medications on file prior to visit.    No Known Allergies Social History   Socioeconomic History  . Marital status: Married    Spouse name: Not on file  . Number of children: Not on file  . Years of education: Not on file  . Highest education level: Not on file  Occupational History  . Not on file  Social Needs  . Financial resource strain: Not on file  . Food insecurity    Worry: Not on file    Inability: Not on file  . Transportation needs    Medical: Not on file    Non-medical: Not on file  Tobacco Use  . Smoking status: Never Smoker  . Smokeless tobacco: Never Used  Substance and Sexual Activity  . Alcohol use: Yes    Comment: Beer prior to diagnosis of diabetes, about once a week during sports on tv  . Drug use: No  . Sexual activity: Not Currently  Lifestyle  . Physical activity    Days per week: Not on file    Minutes per session: Not on file  . Stress: Not on file  Relationships  . Social Herbalist on phone: Not on file    Gets together: Not on  file    Attends religious service: Not on file    Active member of club or organization: Not on file    Attends meetings of clubs or organizations: Not on file    Relationship status: Not on file  . Intimate partner violence    Fear of current or ex partner: Not on file    Emotionally abused: Not on file    Physically abused: Not on file    Forced sexual activity: Not on file  Other Topics Concern  . Not on file  Social History Narrative  . Not on file   Family History  Problem Relation Age of Onset  . Cancer Brother        prostate  . Cancer Mother        breast ca     Review of Systems  All other systems reviewed and are negative.      Objective:   Physical Exam Vitals signs reviewed.  Constitutional:      General: He is not in acute distress.    Appearance: He is well-developed. He is not diaphoretic.  HENT:     Head: Normocephalic and atraumatic.     Right Ear: External ear normal.     Left Ear: External  ear normal.     Nose: Nose normal.     Mouth/Throat:     Pharynx: No oropharyngeal exudate.  Eyes:     General: No scleral icterus.       Right eye: No discharge.        Left eye: No discharge.     Conjunctiva/sclera: Conjunctivae normal.     Pupils: Pupils are equal, round, and reactive to light.  Neck:     Musculoskeletal: Normal range of motion and neck supple.     Thyroid: No thyromegaly.     Vascular: No JVD.     Trachea: No tracheal deviation.  Cardiovascular:     Rate and Rhythm: Normal rate and regular rhythm.     Heart sounds: Normal heart sounds. No murmur. No friction rub. No gallop.   Pulmonary:     Effort: Pulmonary effort is normal. No respiratory distress.     Breath sounds: Normal breath sounds. No stridor. No wheezing or rales.  Chest:     Chest wall: No tenderness.  Abdominal:     General: Bowel sounds are normal. There is no distension.     Palpations: Abdomen is soft. There is no mass.     Tenderness: There is no abdominal  tenderness. There is no guarding or rebound.     Hernia: No hernia is present.  Musculoskeletal: Normal range of motion.        General: No tenderness or deformity.  Lymphadenopathy:     Cervical: No cervical adenopathy.  Skin:    General: Skin is warm.     Coloration: Skin is not pale.     Findings: No erythema or rash.  Neurological:     Mental Status: He is alert and oriented to person, place, and time.     Cranial Nerves: No cranial nerve deficit.     Sensory: No sensory deficit.     Motor: No abnormal muscle tone.     Coordination: Coordination normal.     Deep Tendon Reflexes: Reflexes normal.  Psychiatric:        Behavior: Behavior normal.        Thought Content: Thought content normal.        Judgment: Judgment normal.           Assessment & Plan:  Routine general medical examination at a health care facility  Controlled type 2 diabetes mellitus without complication, with long-term current use of insulin ([place])  Pure hypercholesterolemia  Myalgia  CKD (chronic kidney disease), stage III ([place])  Patient's colonoscopy is up-to-date.  PSA is excellent.  We discussed his immunization and he will defer his flu shot for 1 month.  I recommended the shingles vaccine, shin Grix and also recommended a booster on Pneumovax 23 but he defers these for now.  He denies any issues with memory loss, depression, or falls.  I recommended we temporarily discontinue Crestor to see if the muscle pain in his quadriceps improve.  He will notify me in 3 weeks to give me an update.  His blood pressure is excellent.  His kidney function is stable.  His A1c is outstanding.  I recommended that we slowly reduce his insulin dose by 5 units a day and continue down titrating every week or so until fasting blood sugars are less than 120 and 2-hour postprandial sugars are less than 170.  I believe right now he may be taking more insulin than he needs increasing his risk for hypoglycemia.  Regular  anticipatory guidance  is provided.

## 2019-01-24 DIAGNOSIS — E119 Type 2 diabetes mellitus without complications: Secondary | ICD-10-CM | POA: Diagnosis not present

## 2019-01-24 DIAGNOSIS — H2513 Age-related nuclear cataract, bilateral: Secondary | ICD-10-CM | POA: Diagnosis not present

## 2019-01-24 DIAGNOSIS — H43811 Vitreous degeneration, right eye: Secondary | ICD-10-CM | POA: Diagnosis not present

## 2019-02-03 ENCOUNTER — Encounter: Payer: Self-pay | Admitting: Podiatry

## 2019-02-03 ENCOUNTER — Ambulatory Visit (INDEPENDENT_AMBULATORY_CARE_PROVIDER_SITE_OTHER): Payer: Medicare Other | Admitting: Podiatry

## 2019-02-03 ENCOUNTER — Other Ambulatory Visit: Payer: Self-pay

## 2019-02-03 DIAGNOSIS — E119 Type 2 diabetes mellitus without complications: Secondary | ICD-10-CM | POA: Diagnosis not present

## 2019-02-03 DIAGNOSIS — M79674 Pain in right toe(s): Secondary | ICD-10-CM | POA: Diagnosis not present

## 2019-02-03 DIAGNOSIS — M79675 Pain in left toe(s): Secondary | ICD-10-CM | POA: Diagnosis not present

## 2019-02-03 DIAGNOSIS — B351 Tinea unguium: Secondary | ICD-10-CM

## 2019-02-03 NOTE — Progress Notes (Signed)
Complaint:  Visit Type: Patient returns to my office for continued preventative foot care services. Complaint: Patient states" my nails have grown long and thick and become painful to walk and wear shoes" Patient has been diagnosed with DM with no foot complications. The patient presents for preventative foot care services. No changes to ROS  Podiatric Exam: Vascular: dorsalis pedis and posterior tibial pulses are palpable bilateral. Capillary return is immediate. Temperature gradient is WNL. Skin turgor WNL  Sensorium: Normal Semmes Weinstein monofilament test. Normal tactile sensation bilaterally. Nail Exam: Pt has thick disfigured discolored nails with subungual debris noted bilateral entire nail hallux through fifth toenails.  Right hallux toenail is loosening from nail bed. Ulcer Exam: There is no evidence of ulcer or pre-ulcerative changes or infection. Orthopedic Exam: Muscle tone and strength are WNL. No limitations in general ROM. No crepitus or effusions noted. Foot type and digits show no abnormalities. Bony prominences are unremarkable. Skin: No Porokeratosis. No infection or ulcers  Diagnosis:  Onychomycosis, , Pain in right toe, pain in left toes  Treatment & Plan Procedures and Treatment: Consent by patient was obtained for treatment procedures.   Debridement of mycotic and hypertrophic toenails, 1 through 5 bilateral and clearing of subungual debris. No ulceration, no infection noted.  Return Visit-Office Procedure: Patient instructed to return to the office for a follow up visit 3 months for continued evaluation and treatment.    Gardiner Barefoot DPM

## 2019-02-14 ENCOUNTER — Ambulatory Visit (INDEPENDENT_AMBULATORY_CARE_PROVIDER_SITE_OTHER): Payer: Medicare Other

## 2019-02-14 ENCOUNTER — Other Ambulatory Visit: Payer: Self-pay

## 2019-02-14 DIAGNOSIS — Z23 Encounter for immunization: Secondary | ICD-10-CM | POA: Diagnosis not present

## 2019-02-18 ENCOUNTER — Other Ambulatory Visit: Payer: Self-pay | Admitting: Family Medicine

## 2019-03-24 ENCOUNTER — Other Ambulatory Visit: Payer: Self-pay | Admitting: Family Medicine

## 2019-04-21 ENCOUNTER — Other Ambulatory Visit: Payer: Self-pay | Admitting: Family Medicine

## 2019-04-22 ENCOUNTER — Encounter: Payer: Self-pay | Admitting: Family Medicine

## 2019-04-24 MED ORDER — TRULICITY 1.5 MG/0.5ML ~~LOC~~ SOAJ: SUBCUTANEOUS | 3 refills | Status: DC

## 2019-05-05 ENCOUNTER — Ambulatory Visit: Payer: Medicare Other | Admitting: Podiatry

## 2019-05-05 ENCOUNTER — Encounter: Payer: Self-pay | Admitting: Podiatry

## 2019-05-05 ENCOUNTER — Other Ambulatory Visit: Payer: Self-pay

## 2019-05-05 DIAGNOSIS — E119 Type 2 diabetes mellitus without complications: Secondary | ICD-10-CM

## 2019-05-05 DIAGNOSIS — M79674 Pain in right toe(s): Secondary | ICD-10-CM

## 2019-05-05 DIAGNOSIS — M79675 Pain in left toe(s): Secondary | ICD-10-CM

## 2019-05-05 DIAGNOSIS — B351 Tinea unguium: Secondary | ICD-10-CM

## 2019-05-05 NOTE — Progress Notes (Signed)
Complaint:  Visit Type: Patient returns to my office for continued preventative foot care services. Complaint: Patient states" my nails have grown long and thick and become painful to walk and wear shoes" Patient has been diagnosed with DM with no foot complications. The patient presents for preventative foot care services. No changes to ROS  Podiatric Exam: Vascular: dorsalis pedis and posterior tibial pulses are palpable bilateral. Capillary return is immediate. Temperature gradient is WNL. Skin turgor WNL  Sensorium: Normal Semmes Weinstein monofilament test. Normal tactile sensation bilaterally. Nail Exam: Pt has thick disfigured discolored nails with subungual debris noted bilateral entire nail hallux through fifth toenails.  Right hallux toenail is loosening from nail bed. Ulcer Exam: There is no evidence of ulcer or pre-ulcerative changes or infection. Orthopedic Exam: Muscle tone and strength are WNL. No limitations in general ROM. No crepitus or effusions noted. Foot type and digits show no abnormalities. Bony prominences are unremarkable. Skin: No Porokeratosis. No infection or ulcers  Diagnosis:  Onychomycosis, , Pain in right toe, pain in left toes  Treatment & Plan Procedures and Treatment: Consent by patient was obtained for treatment procedures.   Debridement of mycotic and hypertrophic toenails, 1 through 5 bilateral and clearing of subungual debris. No ulceration, no infection noted.  Return Visit-Office Procedure: Patient instructed to return to the office for a follow up visit 3 months for continued evaluation and treatment.    Gardiner Barefoot DPM

## 2019-05-15 DIAGNOSIS — R825 Elevated urine levels of drugs, medicaments and biological substances: Secondary | ICD-10-CM | POA: Diagnosis not present

## 2019-06-19 ENCOUNTER — Ambulatory Visit: Payer: Medicare Other | Attending: Internal Medicine

## 2019-06-19 DIAGNOSIS — Z23 Encounter for immunization: Secondary | ICD-10-CM | POA: Insufficient documentation

## 2019-06-19 NOTE — Progress Notes (Signed)
   Covid-19 Vaccination Clinic  Name:  Curtis Hunt    MRN: [id number] DOB: Dec 26, 1950  06/19/2019  Curtis Hunt was observed post Covid-19 immunization for 15 minutes without incidence. He was provided with Vaccine Information Sheet and instruction to access the V-Safe system.   Curtis Hunt was instructed to call 911 with any severe reactions post vaccine: Marland Kitchen Difficulty breathing  . Swelling of your face and throat  . A fast heartbeat  . A bad rash all over your body  . Dizziness and weakness    Immunizations Administered    Name Date Dose VIS Date Route   Pfizer COVID-19 Vaccine 06/19/2019  8:46 AM 0.3 mL 05/05/2019 Intramuscular   Manufacturer: [place]   Lot: [id number]   [place]: [id number]

## 2019-07-10 ENCOUNTER — Ambulatory Visit: Payer: Medicare Other | Attending: Internal Medicine

## 2019-07-10 DIAGNOSIS — Z23 Encounter for immunization: Secondary | ICD-10-CM | POA: Insufficient documentation

## 2019-07-10 NOTE — Progress Notes (Signed)
   Covid-19 Vaccination Clinic  Name:  Curtis Hunt    MRN: [id number] DOB: 05-Dec-1950  07/10/2019  [name] was observed post Covid-19 immunization for 15 minutes without incidence. He was provided with Vaccine Information Sheet and instruction to access the V-Safe system.   [name] was instructed to call 911 with any severe reactions post vaccine: Marland Kitchen Difficulty breathing  . Swelling of your face and throat  . A fast heartbeat  . A bad rash all over your body  . Dizziness and weakness    Immunizations Administered    Name Date Dose VIS Date Route   Pfizer COVID-19 Vaccine 07/10/2019  8:52 AM 0.3 mL 05/05/2019 Intramuscular   Manufacturer: [place]   Lot: [id number]   [place]: [id number]

## 2019-08-04 ENCOUNTER — Other Ambulatory Visit: Payer: Self-pay

## 2019-08-04 ENCOUNTER — Encounter: Payer: Self-pay | Admitting: Podiatry

## 2019-08-04 ENCOUNTER — Ambulatory Visit: Payer: Medicare Other | Admitting: Podiatry

## 2019-08-04 VITALS — Temp 97.2°F

## 2019-08-04 DIAGNOSIS — B351 Tinea unguium: Secondary | ICD-10-CM | POA: Diagnosis not present

## 2019-08-04 DIAGNOSIS — M79674 Pain in right toe(s): Secondary | ICD-10-CM | POA: Diagnosis not present

## 2019-08-04 DIAGNOSIS — E119 Type 2 diabetes mellitus without complications: Secondary | ICD-10-CM | POA: Diagnosis not present

## 2019-08-04 DIAGNOSIS — M79675 Pain in left toe(s): Secondary | ICD-10-CM | POA: Diagnosis not present

## 2019-08-04 NOTE — Progress Notes (Signed)
This patient returns to my office for at risk foot care.  This patient requires this care by a professional since this patient will be at risk due to having  Diabetes and chronic kidney disease.  This patient is unable to cut nails himself  since the patient cannot reach his  nails.These nails are painful walking and wearing shoes.  This patient presents for at risk foot care today.  General Appearance  Alert, conversant and in no acute stress.  Vascular  Dorsalis pedis and posterior tibial  pulses are palpable  bilaterally.  Capillary return is within normal limits  bilaterally. Temperature is within normal limits  bilaterally.  Neurologic  Senn-Weinstein monofilament wire test within normal limits  bilaterally. Muscle power within normal limits bilaterally.  Nails Thick disfigured discolored nails with subungual debris  from hallux to fifth toes bilaterally. No evidence of bacterial infection or drainage bilaterally.  Orthopedic  No limitations of motion  feet .  No crepitus or effusions noted.  No bony pathology or digital deformities noted. HAV  B/L.  Skin  normotropic skin with no porokeratosis noted bilaterally.  No signs of infections or ulcers noted.     Onychomycosis  Pain in right toes  Pain in left toes  Consent was obtained for treatment procedures.   Mechanical debridement of nails 1-5  bilaterally performed with a nail nipper.  Filed with dremel without incident. No infection or ulcer.     Return office visit  3 months         Told patient to return for periodic foot care and evaluation due to potential at risk complications.   Gardiner Barefoot DPM

## 2019-08-09 ENCOUNTER — Other Ambulatory Visit: Payer: Self-pay | Admitting: Family Medicine

## 2019-08-28 ENCOUNTER — Ambulatory Visit (INDEPENDENT_AMBULATORY_CARE_PROVIDER_SITE_OTHER): Payer: Medicare Other | Admitting: Family Medicine

## 2019-08-28 ENCOUNTER — Encounter: Payer: Self-pay | Admitting: Family Medicine

## 2019-08-28 ENCOUNTER — Other Ambulatory Visit: Payer: Medicare Other

## 2019-08-28 ENCOUNTER — Other Ambulatory Visit: Payer: Self-pay

## 2019-08-28 VITALS — BP 150/84 | HR 70 | Temp 96.9°F | Resp 14 | Ht 73.0 in | Wt 256.0 lb

## 2019-08-28 DIAGNOSIS — M25551 Pain in right hip: Secondary | ICD-10-CM | POA: Diagnosis not present

## 2019-08-28 DIAGNOSIS — Z8546 Personal history of malignant neoplasm of prostate: Secondary | ICD-10-CM

## 2019-08-28 NOTE — Progress Notes (Signed)
Subjective:    Patient ID: Curtis Hunt, male    DOB: [birth year], 69 y.o.   MRN: [id number]  Patient is a 69 year old African-American gentleman with a history of prostate cancer treated with prostatectomy who presents with a 2 to 34-monthhistory of pain in his right leg.  He reports pain in his lateral medial right thigh with ambulation.  He reports pain laterally puts pressure on his leg.  He denies any falls or injuries.  There is no tenderness to palpation over the lateral right thigh/right quadricep.  Patient denies any numbness or tingling in his legs.  He denies any radicular symptoms.  He denies any paresthesias in his right leg.  He denies any pain with range of motion in his right knee.  However he does have a lot of stiffness in his right hip.  I am unable to internally rotate the right hip more than 5 degrees.  External rotation is limited to 5 to 10 degrees due to pain.  I believe the patient's pain may be due to severe osteoarthritis in the right hip. Past Medical History:  Diagnosis Date  . Cancer ([facility] 2004   Prostate CA with surgery  . [place]   . Gout   . Hyperlipidemia   . Prostate cancer ([facility]    Past Surgical History:  Procedure Laterality Date  . PROSTATE SURGERY     Current Outpatient Medications on File Prior to Visit  Medication Sig Dispense Refill  . allopurinol (ZYLOPRIM) 100 MG tablet Take 3 tablets (300 mg total) by mouth daily. 270 tablet 1  . aspirin 81 MG tablet Take 81 mg by mouth daily.    . B-D UF III MINI PEN NEEDLES 31G X 5 MM MISC USE AS DIRECTED TWICE A DAY 100 each 3  . Blood Glucose Monitoring Suppl (ONE TOUCH ULTRA 2) w/Device KIT AS DRIECTED 1 kit 0  . GAVILYTE-G 236 g solution See admin instructions.    .Marland Kitchenglucose blood (ONE TOUCH ULTRA TEST) test strip CHECK SUGAR 4-5 TIMES A DAY.EVERY MORNING AFTER MEALS AND AT BEDTIME 200 each 8  . glucose blood (ONE TOUCH ULTRA TEST) test strip CHECK SUGAR 4-5 TIMES A  DAY. EVERY MORNING AFTER MEALS & AT BEDTIME. Dx: E11.65. 200 each 5  . Insulin Detemir (LEVEMIR FLEXTOUCH) 100 UNIT/ML Pen Inject 30 Units into the skin daily at 10 pm. (Patient taking differently: Inject 25 Units into the skin daily at 10 pm. ) 45 pen 1  . Insulin Pen Needle 31G X 6 MM MISC Use with Levemir Flextouch Pen 100 each 0  . rosuvastatin (CRESTOR) 40 MG tablet TAKE 1 TABLET BY MOUTH EVERY DAY 90 tablet 1  . TRULICITY 1.5 MBV/6.9IHSOPN INJECT 1.5 MG INTO THE SKIN ONCE A WEEK 4 pen 11  . VIAGRA 100 MG tablet TAKE 0.5-1 TABLETS (50-100 MG TOTAL) BY MOUTH DAILY AS NEEDED FOR ERECTILE DYSFUNCTION. 5 tablet 0   No current facility-administered medications on file prior to visit.   No Known Allergies Social History   Socioeconomic History  . Marital status: Married    Spouse name: Not on file  . Number of children: Not on file  . Years of education: Not on file  . Highest education level: Not on file  Occupational History  . Not on file  Tobacco Use  . Smoking status: Never Smoker  . Smokeless tobacco: Never Used  Substance and Sexual Activity  . Alcohol use: Yes  Comment: Beer prior to diagnosis of diabetes, about once a week during sports on tv  . Drug use: No  . Sexual activity: Not Currently  Other Topics Concern  . Not on file  Social History Narrative  . Not on file   Social Determinants of Health   Financial Resource Strain:   . Difficulty of Paying Living Expenses:   Food Insecurity:   . Worried About Charity fundraiser in the Last Year:   . Arboriculturist in the Last Year:   Transportation Needs:   . Film/video editor (Medical):   Marland Kitchen Lack of Transportation (Non-Medical):   Physical Activity:   . Days of Exercise per Week:   . Minutes of Exercise per Session:   Stress:   . Feeling of Stress :   Social Connections:   . Frequency of Communication with Friends and Family:   . Frequency of Social Gatherings with Friends and Family:   . Attends  Religious Services:   . Active Member of Clubs or Organizations:   . Attends Archivist Meetings:   Marland Kitchen Marital Status:   Intimate Partner Violence:   . Fear of Current or Ex-Partner:   . Emotionally Abused:   Marland Kitchen Physically Abused:   . Sexually Abused:    Family History  Problem Relation Age of Onset  . Cancer Brother        prostate  . Cancer Mother        breast ca     Review of Systems  All other systems reviewed and are negative.      Objective:   Physical Exam Vitals reviewed.  Constitutional:      General: He is not in acute distress.    Appearance: He is well-developed. He is not diaphoretic.  HENT:     Head: Normocephalic and atraumatic.     Right Ear: External ear normal.     Left Ear: External ear normal.     Nose: Nose normal.     Mouth/Throat:     Pharynx: No oropharyngeal exudate.  Eyes:     General: No scleral icterus.       Right eye: No discharge.        Left eye: No discharge.     Conjunctiva/sclera: Conjunctivae normal.     Pupils: Pupils are equal, round, and reactive to light.  Neck:     Thyroid: No thyromegaly.     Vascular: No JVD.     Trachea: No tracheal deviation.  Cardiovascular:     Rate and Rhythm: Normal rate and regular rhythm.     Heart sounds: Normal heart sounds. No murmur. No friction rub. No gallop.   Pulmonary:     Effort: Pulmonary effort is normal. No respiratory distress.     Breath sounds: Normal breath sounds. No stridor. No wheezing or rales.  Chest:     Chest wall: No tenderness.  Abdominal:     General: Bowel sounds are normal. There is no distension.     Palpations: Abdomen is soft. There is no mass.     Tenderness: There is no abdominal tenderness. There is no guarding or rebound.     Hernia: No hernia is present.  Musculoskeletal:        General: No deformity.     Cervical back: Normal range of motion and neck supple.     Right hip: Tenderness present. Decreased range of motion. Decreased strength.      Right upper  leg: Normal. No swelling, edema, deformity, lacerations, tenderness or bony tenderness.     Right knee: Normal. No swelling, effusion, erythema or bony tenderness. Normal range of motion. No tenderness. No LCL laxity, MCL laxity, ACL laxity or PCL laxity.       Legs:  Lymphadenopathy:     Cervical: No cervical adenopathy.  Skin:    General: Skin is warm.     Coloration: Skin is not pale.     Findings: No erythema or rash.  Neurological:     Mental Status: He is alert and oriented to person, place, and time.     Cranial Nerves: No cranial nerve deficit.     Sensory: No sensory deficit.     Motor: No abnormal muscle tone.     Coordination: Coordination normal.     Deep Tendon Reflexes: Reflexes normal.  Psychiatric:        Behavior: Behavior normal.        Thought Content: Thought content normal.        Judgment: Judgment normal.           Assessment & Plan:  Right hip pain - Plan: DG Hip Unilat W OR W/O Pelvis 2-3 Views Right, DG FEMUR, MIN 2 VIEWS RIGHT  History of prostate cancer - Plan: DG Hip Unilat W OR W/O Pelvis 2-3 Views Right, DG FEMUR, MIN 2 VIEWS RIGHT Obtain x-rays of the right femur as well as the right hip and pelvis given his history of prostate cancer rule out neoplasm of the bone.  I suspect osteoarthritis in the right hip limiting his range of motion and referring pain into the proximal lateral aspect of the right thigh.  Also the differential diagnosis would be lumbar radiculopathy.  Await results of x-rays and if x-rays confirm osteoarthritis I would recommend cortisone injection under the care of orthopedics.

## 2019-08-29 ENCOUNTER — Ambulatory Visit
Admission: RE | Admit: 2019-08-29 | Discharge: 2019-08-29 | Disposition: A | Discharge status: Home / Self Care | Payer: Medicare Other | Source: Ambulatory Visit | Attending: Family Medicine | Admitting: Family Medicine

## 2019-08-29 DIAGNOSIS — M1611 Unilateral primary osteoarthritis, right hip: Secondary | ICD-10-CM | POA: Diagnosis not present

## 2019-08-29 DIAGNOSIS — M25551 Pain in right hip: Secondary | ICD-10-CM

## 2019-08-29 DIAGNOSIS — Z8546 Personal history of malignant neoplasm of prostate: Secondary | ICD-10-CM

## 2019-08-30 ENCOUNTER — Other Ambulatory Visit: Payer: Self-pay | Admitting: Family Medicine

## 2019-08-30 DIAGNOSIS — M25551 Pain in right hip: Secondary | ICD-10-CM

## 2019-09-08 ENCOUNTER — Ambulatory Visit: Payer: Self-pay

## 2019-09-08 ENCOUNTER — Encounter: Payer: Self-pay | Admitting: Orthopaedic Surgery

## 2019-09-08 ENCOUNTER — Ambulatory Visit: Payer: Medicare Other | Admitting: Orthopaedic Surgery

## 2019-09-08 ENCOUNTER — Other Ambulatory Visit: Payer: Self-pay

## 2019-09-08 DIAGNOSIS — M1611 Unilateral primary osteoarthritis, right hip: Secondary | ICD-10-CM | POA: Diagnosis not present

## 2019-09-08 MED ORDER — MELOXICAM 7.5 MG PO TABS: 7.5000 mg | ORAL_TABLET | Freq: Two times a day (BID) | ORAL | 2 refills | Status: DC | PRN

## 2019-09-08 NOTE — Progress Notes (Signed)
Subjective: Patient is here for ultrasound-guided intra-articular right hip injection.   Has end-stage DJD.  Objective:  Pain and decreased ROM with IR.  Procedure: Ultrasound-guided right hip injection: After sterile prep with Betadine, injected 8 cc 1% lidocaine without epinephrine and 40 mg methylprednisolone using a 22-gauge spinal needle, passing the needle through the iliofemoral ligament into the femoral head/neck junction.  Injectate seen filling joint capsule.  Good immediate relief.

## 2019-09-08 NOTE — Progress Notes (Signed)
Office Visit Note   Patient: Curtis Hunt           Date of Birth: 12-30-1950           MRN: [id number] Visit Date: 09/08/2019              Requested by: Susy Frizzle, MD [address] [place],  [place] [address] PCP: Susy Frizzle, MD   Assessment & Plan: Visit Diagnoses:  1. Primary osteoarthritis of right hip     Plan: Impression is end-stage right hip DJD.  I reviewed the x-rays with the patient in detail which shows severe DJD with large superior acetabular osteophyte.  He has complete destruction of the joint space.  We had a long discussion on different treatment options and their likelihood for meaningful sustained pain relief.  He is looking at having a hip replacement sometime in the summer.  In the meantime he will like to try cortisone injection and prescription for meloxicam.  Patient is diabetic with most recent A1c of 6.4.  Information on hip replacement surgery provided today.  Questions encouraged and answered.  Follow-Up Instructions: Return if symptoms worsen or fail to improve.   Orders:  Orders Placed This Encounter  Procedures  . XR HIP UNILAT W OR W/O PELVIS 2-3 VIEWS RIGHT   Meds ordered this encounter  Medications  . meloxicam (MOBIC) 7.5 MG tablet    Sig: Take 1 tablet (7.5 mg total) by mouth 2 (two) times daily as needed for pain.    Dispense:  30 tablet    Refill:  2      Procedures: No procedures performed   Clinical Data: No additional findings.   Subjective: Chief Complaint  Patient presents with  . Right Hip - Pain    Aidynn very pleasant 69 year old gentleman referral from Dr. Dennard Schaumann who comes in for evaluation of chronic right hip and thigh pain for years that has recently gotten significantly worse.  He endorses groin pain as well.  He has significant weakness with ambulation.  The pain is causing him to limp.  He is unable to tie his shoes or put on socks.  He is 8/10 pain.  He takes aspirin with some mild  relief.  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   Review of Systems  Constitutional: Negative.   All other systems reviewed and are negative.    Objective: Vital Signs: There were no vitals taken for this visit.  Physical Exam Vitals and nursing note reviewed.  Constitutional:      Appearance: He is well-developed.  HENT:     Head: Normocephalic and atraumatic.  Eyes:     Pupils: Pupils are equal, round, and reactive to light.  Pulmonary:     Effort: Pulmonary effort is normal.  Abdominal:     Palpations: Abdomen is soft.  Musculoskeletal:        General: Normal range of motion.     Cervical back: Neck supple.  Skin:    General: Skin is warm.  Neurological:     Mental Status: He is alert and oriented to person, place, and time.  Psychiatric:        Behavior: Behavior normal.        Thought Content: Thought content normal.        Judgment: Judgment normal.     Ortho Exam Right hip demonstrates essentially no internal and external rotation.  Significant pain with  any movement of the hip.  No sciatic tension signs. Specialty Comments:  No specialty comments available.  Imaging: XR HIP UNILAT W OR W/O PELVIS 2-3 VIEWS RIGHT  Result Date: 09/08/2019 Severe right hip DJD.  Severe left hip DJD as well.    PMFS History: Patient Active Problem List   Diagnosis Date Noted  . Primary osteoarthritis of right hip 09/08/2019  . Malignant neoplasm of prostate ([place]) 04/29/2015  . Rhabdomyolysis 06/09/2013  . Dehydration 06/09/2013  . Newly diagnosed diabetes ([place]) 06/09/2013  . Dyslipidemia 06/09/2013  . Metabolic encephalopathy [id number]  . Hypernatremia 06/09/2013  . DKA (diabetic ketoacidoses) ([place]) 06/08/2013  . Weight loss 06/08/2013  . Oral thrush 06/08/2013  . GERD (gastroesophageal reflux disease) 06/08/2013  . Jerking movements of extremities 06/08/2013  . Acute renal failure ([place]) 06/08/2013  . Gout    Past Medical History:   Diagnosis Date  . Cancer [facility] 2004   Prostate CA with surgery  . Diabetes mellitus without complication ([place])   . Gout   . Hyperlipidemia   . Prostate cancer [facility])     Family History  Problem Relation Age of Onset  . Cancer Brother        prostate  . Cancer Mother        breast ca    Past Surgical History:  Procedure Laterality Date  . PROSTATE SURGERY     Social History   Occupational History  . Not on file  Tobacco Use  . Smoking status: Never Smoker  . Smokeless tobacco: Never Used  Substance and Sexual Activity  . Alcohol use: Yes    Comment: Beer prior to diagnosis of diabetes, about once a week during sports on tv  . Drug use: No  . Sexual activity: Not Currently

## 2019-09-28 ENCOUNTER — Other Ambulatory Visit: Payer: Self-pay | Admitting: Family Medicine

## 2019-10-02 ENCOUNTER — Other Ambulatory Visit: Payer: Self-pay | Admitting: *Deleted

## 2019-10-02 MED ORDER — LEVEMIR FLEXTOUCH 100 UNIT/ML ~~LOC~~ SOPN: 25.0000 [IU] | PEN_INJECTOR | Freq: Every day | SUBCUTANEOUS | 3 refills | Status: DC

## 2019-10-09 ENCOUNTER — Other Ambulatory Visit: Payer: Self-pay | Admitting: Family Medicine

## 2019-10-20 ENCOUNTER — Telehealth: Payer: Self-pay | Admitting: Family Medicine

## 2019-10-20 NOTE — Progress Notes (Signed)
  Chronic Care Management   Note  10/20/2019 Name: Curtis Hunt MRN: [id number] DOB: May 09, 1951  Curtis Hunt is a 69 y.o. year old male who is a primary care patient of Dennard Schaumann, [name], MD. I reached out to Curtis Hunt by phone today in response to a referral sent by Mr. Vernell Barrier PCP, Susy Frizzle, MD.   [name] was given information about Chronic Care Management services today including:  1. CCM service includes personalized support from designated clinical staff supervised by his physician, including individualized plan of care and coordination with other care providers 2. 24/7 contact phone numbers for assistance for urgent and routine care needs. 3. Service will only be billed when office clinical staff spend 20 minutes or more in a month to coordinate care. 4. Only one practitioner may furnish and bill the service in a calendar month. 5. The patient may stop CCM services at any time (effective at the end of the month) by phone call to the office staff.   Patient agreed to services and verbal consent obtained.   This note is not being shared with the patient for the following reason: To respect privacy (The patient or proxy has requested that the information not be shared).   Follow up plan:  [place]

## 2019-10-24 ENCOUNTER — Other Ambulatory Visit: Payer: Self-pay

## 2019-10-24 ENCOUNTER — Other Ambulatory Visit: Payer: Medicare Other

## 2019-10-24 DIAGNOSIS — E7800 Pure hypercholesterolemia, unspecified: Secondary | ICD-10-CM

## 2019-10-24 DIAGNOSIS — N183 Chronic kidney disease, stage 3 unspecified: Secondary | ICD-10-CM

## 2019-10-24 DIAGNOSIS — Z794 Long term (current) use of insulin: Secondary | ICD-10-CM

## 2019-10-24 DIAGNOSIS — Z022 Encounter for examination for admission to residential institution: Secondary | ICD-10-CM | POA: Diagnosis not present

## 2019-10-24 DIAGNOSIS — Z111 Encounter for screening for respiratory tuberculosis: Secondary | ICD-10-CM

## 2019-10-24 DIAGNOSIS — E119 Type 2 diabetes mellitus without complications: Secondary | ICD-10-CM | POA: Diagnosis not present

## 2019-10-25 LAB — COMPLETE METABOLIC PANEL WITH GFR
AG Ratio: 1.2 (calc) (ref 1.0–2.5)
ALT: 15 U/L (ref 9–46)
AST: 17 U/L (ref 10–35)
Albumin: 3.9 g/dL (ref 3.6–5.1)
Alkaline phosphatase (APISO): 63 U/L (ref 35–144)
BUN: 19 mg/dL (ref 7–25)
CO2: 28 mmol/L (ref 20–32)
Calcium: 9.5 mg/dL (ref 8.6–10.3)
Chloride: 104 mmol/L (ref 98–110)
Creat: 1.2 mg/dL (ref 0.70–1.25)
GFR, Est African American: 71 mL/min/{1.73_m2} (ref >=60)
GFR, Est Non African American: 61 mL/min/{1.73_m2} (ref >=60)
Globulin: 3.3 g/dL (calc) (ref 1.9–3.7)
Glucose, Bld: 100 mg/dL — ABNORMAL HIGH (ref 65–99)
Potassium: 4 mmol/L (ref 3.5–5.3)
Sodium: 139 mmol/L (ref 135–146)
Total Bilirubin: 0.2 mg/dL (ref 0.2–1.2)
Total Protein: 7.2 g/dL (ref 6.1–8.1)

## 2019-10-25 LAB — CBC WITH DIFFERENTIAL/PLATELET
Absolute Monocytes: 435 cells/uL (ref 200–950)
Basophils Absolute: 80 cells/uL (ref 0–200)
Basophils Relative: 1.6 %
Eosinophils Absolute: 140 cells/uL (ref 15–500)
Eosinophils Relative: 2.8 %
HCT: 38.5 % (ref 38.5–50.0)
Hemoglobin: 12.2 g/dL — ABNORMAL LOW (ref 13.2–17.1)
Lymphs Abs: 1655 cells/uL (ref 850–3900)
MCH: 27.7 pg (ref 27.0–33.0)
MCHC: 31.7 g/dL — ABNORMAL LOW (ref 32.0–36.0)
MCV: 87.3 fL (ref 80.0–100.0)
MPV: 10.6 fL (ref 7.5–12.5)
Monocytes Relative: 8.7 %
Neutro Abs: 2690 cells/uL (ref 1500–7800)
Neutrophils Relative %: 53.8 %
Platelets: 271 10*3/uL (ref 140–400)
RBC: 4.41 10*6/uL (ref 4.20–5.80)
RDW: 14.5 % (ref 11.0–15.0)
Total Lymphocyte: 33.1 %
WBC: 5 10*3/uL (ref 3.8–10.8)

## 2019-10-25 LAB — HEMOGLOBIN A1C
Hgb A1c MFr Bld: 6.1 % of total Hgb — ABNORMAL HIGH (ref <5.7)
Mean Plasma Glucose: 128 (calc)
eAG (mmol/L): 7.1 (calc)

## 2019-10-25 LAB — LIPID PANEL
Cholesterol: 143 mg/dL (ref <200)
HDL: 53 mg/dL (ref >=40)
LDL Cholesterol (Calc): 71 mg/dL (calc)
Non-HDL Cholesterol (Calc): 90 mg/dL (calc) (ref <130)
Total CHOL/HDL Ratio: 2.7 (calc) (ref <5.0)
Triglycerides: 100 mg/dL (ref <150)

## 2019-10-26 LAB — QUANTIFERON-TB GOLD PLUS
Mitogen-NIL: >10 IU/mL
NIL: 0.1 IU/mL
QuantiFERON-TB Gold Plus: NEGATIVE
TB1-NIL: 0 IU/mL
TB2-NIL: <0 IU/mL

## 2019-11-08 ENCOUNTER — Ambulatory Visit: Payer: Medicare Other | Admitting: Podiatry

## 2019-11-09 ENCOUNTER — Other Ambulatory Visit: Payer: Self-pay | Admitting: Family Medicine

## 2019-11-23 ENCOUNTER — Encounter: Payer: Self-pay | Admitting: Orthopaedic Surgery

## 2019-11-23 ENCOUNTER — Ambulatory Visit: Payer: Medicare Other | Admitting: Orthopaedic Surgery

## 2019-11-23 VITALS — Ht 73.0 in | Wt 264.6 lb

## 2019-11-23 DIAGNOSIS — M1611 Unilateral primary osteoarthritis, right hip: Secondary | ICD-10-CM

## 2019-11-23 NOTE — Progress Notes (Signed)
Office Visit Note   Patient: Curtis Hunt           Date of Birth: 1950-08-10           MRN: [id number] Visit Date: 11/23/2019              Requested by: Susy Frizzle, MD [address] [place],  [place] [address] PCP: Susy Frizzle, MD   Assessment & Plan: Visit Diagnoses:  1. Primary osteoarthritis of right hip     Plan: Impression is end-stage right hip DJD.  Minimal relief from cortisone injection.  Again we had a long discussion on other treatment options at this point he is ready to undergo a total hip replacement.  Risk benefits rehab recovery including infection, DVT, leg length discrepancy, dislocation reviewed in detail.  Denies history of DVT.  We look forward to treating him in the near future.  Follow-Up Instructions: Return if symptoms worsen or fail to improve.   Orders:  No orders of the defined types were placed in this encounter.  No orders of the defined types were placed in this encounter.     Procedures: No procedures performed   Clinical Data: No additional findings.   Subjective: Chief Complaint  Patient presents with  . Right Hip - Follow-up    Curtis Hunt is a 69 year old gentleman who is following up for end-stage right hip DJD.  He had a cortisone injection in the right hip on 09/08/2019.  He states that it took the edge off but he is certainly not happy with how it feels overall.  He feels that he has no quality of life at this point and he is severely limited by this.   Review of Systems   Objective: Vital Signs: Ht 6\' 1"  (1.854 m)   Wt 264 lb 9.6 oz (120 kg)   BMI 34.91 kg/m   Physical Exam  Ortho Exam Right hip exam is unchanged. Specialty Comments:  No specialty comments available.  Imaging: No results found.   PMFS History: Patient Active Problem List   Diagnosis Date Noted  . Primary osteoarthritis of right hip 09/08/2019  . Malignant neoplasm of prostate ([place]) 04/29/2015  . Rhabdomyolysis 06/09/2013    . Dehydration 06/09/2013  . Newly diagnosed diabetes ([place]) 06/09/2013  . Dyslipidemia 06/09/2013  . Metabolic encephalopathy 93/81/8299  . Hypernatremia 06/09/2013  . DKA (diabetic ketoacidoses) ([place]) 06/08/2013  . Weight loss 06/08/2013  . Oral thrush 06/08/2013  . GERD (gastroesophageal reflux disease) 06/08/2013  . Jerking movements of extremities 06/08/2013  . Acute renal failure ([place]) 06/08/2013  . Gout    Past Medical History:  Diagnosis Date  . Cancer [facility]) 2004   Prostate CA with surgery  . Diabetes mellitus without complication ([place])   . Gout   . Hyperlipidemia   . Prostate cancer [facility])     Family History  Problem Relation Age of Onset  . Cancer Brother        prostate  . Cancer Mother        breast ca    Past Surgical History:  Procedure Laterality Date  . PROSTATE SURGERY     Social History   Occupational History  . Not on file  Tobacco Use  . Smoking status: Never Smoker  . Smokeless tobacco: Never Used  Substance and Sexual Activity  . Alcohol use: Yes    Comment: Beer prior to diagnosis of diabetes, about once a week during sports on tv  .  Drug use: No  . Sexual activity: Not Currently

## 2019-11-24 ENCOUNTER — Other Ambulatory Visit: Payer: Self-pay

## 2019-11-30 ENCOUNTER — Other Ambulatory Visit: Payer: Self-pay | Admitting: *Deleted

## 2019-11-30 DIAGNOSIS — C61 Malignant neoplasm of prostate: Secondary | ICD-10-CM

## 2019-11-30 DIAGNOSIS — E119 Type 2 diabetes mellitus without complications: Secondary | ICD-10-CM

## 2019-11-30 DIAGNOSIS — R634 Abnormal weight loss: Secondary | ICD-10-CM

## 2019-12-03 ENCOUNTER — Other Ambulatory Visit: Payer: Self-pay | Admitting: Family Medicine

## 2019-12-04 NOTE — Chronic Care Management (AMB) (Addendum)
Chronic Care Management Pharmacy  Name: Curtis Hunt  MRN: [id number] DOB: [birth year]  Chief Complaint/ HPI  Curtis Hunt,  69 y.o. , male presents for their Initial CCM visit with the clinical pharmacist via telephone.  PCP : Curtis Frizzle, MD  Their chronic conditions include: GERD, diabetes, gout, dyslipidemia.  Office Visits:  08/28/2019 Curtis Hunt ) - severe hip pain, X-ray taken and referred to ortho for further evaluation and cortisone injection  Consult Visit:  11/23/2019 Curtis Hunt, ortho) - right hip DJD follow up, cortisone injection previously provided minimal relief at best, patient ready for total hip replacement to improve quality of life  09/08/2019 Curtis Hunt, ortho) - given injection that provided short term relief, also started on meloxicam  Medications: Outpatient Encounter Medications as of 12/05/2019  Medication Sig   allopurinol (ZYLOPRIM) 100 MG tablet Take 3 tablets (300 mg total) by mouth daily. (Patient not taking: Reported on 12/04/2019)   aspirin 81 MG tablet Take 81 mg by mouth daily.   B-D UF III MINI PEN NEEDLES 31G X 5 MM MISC USE AS DIRECTED TWICE A DAY   Blood Glucose Monitoring Suppl (ONE TOUCH ULTRA 2) w/Device KIT AS DRIECTED   glucose blood (ONE TOUCH ULTRA TEST) test strip CHECK SUGAR 4-5 TIMES A DAY.EVERY MORNING AFTER MEALS AND AT BEDTIME   insulin detemir (LEVEMIR FLEXTOUCH) 100 UNIT/ML FlexPen Inject 25 Units into the skin daily at 10 pm. (Patient taking differently: Inject 20 Units into the skin daily at 10 pm. )   Insulin Pen Needle 31G X 6 MM MISC Use with Levemir Flextouch Pen   meloxicam (MOBIC) 7.5 MG tablet Take 1 tablet (7.5 mg total) by mouth 2 (two) times daily as needed for pain. (Patient not taking: Reported on 12/04/2019)   ONETOUCH ULTRA test strip CHECK SUGAR 4 TO 5 TIMES DAILY EVERY MORNING, AFTER MEALS, AND AT BEDTIME E11.65   rosuvastatin (CRESTOR) 40 MG tablet TAKE 1 TABLET BY MOUTH EVERY DAY (Patient taking differently: Take  40 mg by mouth daily. )   TRULICITY 1.5 QP/5.9FM SOPN INJECT 1.5 MG INTO THE SKIN ONCE A WEEK (Patient taking differently: Inject 1.5 mg as directed every Tuesday. )   VIAGRA 100 MG tablet TAKE 0.5-1 TABLETS (50-100 MG TOTAL) BY MOUTH DAILY AS NEEDED FOR ERECTILE DYSFUNCTION. (Patient not taking: Reported on 12/04/2019)   [DISCONTINUED] GAVILYTE-G 236 g solution See admin instructions.   [DISCONTINUED] glucose blood (ONE TOUCH ULTRA TEST) test strip CHECK SUGAR 4-5 TIMES A DAY. EVERY MORNING AFTER MEALS & AT BEDTIME. Dx: E11.65.   No facility-administered encounter medications on file as of 12/05/2019.     Current Diagnosis/Assessment:  Goals Addressed             This Visit's Progress    Pharmacy Care Plan:       CARE PLAN ENTRY (see longitudinal plan of care for additional care plan information)  Current Barriers:  Chronic Disease Management support, education, and care coordination needs related to Diabetes, GERD, Gout, and dyslipidemia  Dyslipidemia Lab Results  Component Value Date/Time   LDLCALC 71 10/24/2019 08:50 AM   Pharmacist Clinical Goal(s): Over the next 180 days, patient will work with PharmD and providers to maintain LDL goal < 70 Current regimen:  Rosuvastatin 84m Interventions: Reviewed most recent lipid panel Counseled on importance of statin medications for overall heart benefit Patient self care activities - Over the next 180 days, patient will: Continue to take medication as directed Focus on medication adherence by  pill count  Diabetes Lab Results  Component Value Date/Time   HGBA1C 6.1 (H) 10/24/2019 08:50 AM   HGBA1C 6.4 (H) 01/17/2019 08:10 AM   Pharmacist Clinical Goal(s): Over the next 180 days, patient will work with PharmD and providers to maintain A1c goal <7% Current regimen:  Trulicity 6.7HA weekly Levemir 25 units daily Interventions: Reviewed home blood sugar readings Reviewed most recent A1c  Discussed current diet Evaluated  for financial hardships for brand name medications Patient self care activities - Over the next 180 days, patient will: Check blood sugar once daily, document, and provide at future appointments Contact provider with any episodes of hypoglycemia Contact provider should you not be able to afford either medication for blood sugar  GERD Pharmacist Clinical Goal(s) Over the next 180 days, patient will work with PharmD and providers to optimize medication and minimize symptoms related to GERD. Current regimen:  None Interventions: Discussed frequency of symptoms. Discussed trigger foods. Patient self care activities - Over the next 180 days, patient will: Contact provider with any increase in symptom frequency. May take OTC Tums periodically for symptom relief. Gout Pharmacist Clinical Goal(s) Over the next 180 days, patient will work with PharmD and providers to optimize medication and minimize symptoms related to gout. Current regimen:  Allopurinol 145m three tablets daily Interventions: Discussed frequency of gout symptoms Patient self care activities - Over the next 180 days, patient will: Continue to take medication as directed Contact providers with increase in symptoms   Initial goal documentation         Diabetes   Recent Relevant Labs: Lab Results  Component Value Date/Time   HGBA1C 6.1 (H) 10/24/2019 08:50 AM   HGBA1C 6.4 (H) 01/17/2019 08:10 AM   MICROALBUR 6.0 01/17/2019 08:19 AM   MICROALBUR 1.7 02/25/2018 12:37 PM     Checking BG: Daily  Recent FBG Readings: 90-105  Recent 2hr PP BG readings:  < 180  Patient is currently controlled on the following medications:  Trulicity 11.9FX/9.0WILevemir 100u/ml 25 units daily  Last diabetic Foot exam:  Lab Results  Component Value Date/Time   HMDIABEYEEXA No Retinopathy 10/19/2017 12:00 AM    Last diabetic Eye exam: No results found for: HMDIABFOOTEX   Patient reports compliance with all medications.   Loves the convenience of Trulicity.  Denies any episodes of hypoglycemia.  Does admit to high cost on Trulicity (~~097/DZ), however has a total income of around 70k per year which rules him out of patient assistance.  Patient is not at risk of hardship due to cost of medication.  Reports great diet and verbalizes knowledge of what to limit servings of.  Plan  Continue current medications, contact providers should copay on Trulicity become a burden.  Hyperlipidemia   LDL goal < 70  Lipid Panel     Component Value Date/Time   CHOL 143 10/24/2019 0850   TRIG 100 10/24/2019 0850   HDL 53 10/24/2019 0850   LDLCALC 71 10/24/2019 0850    Hepatic Function Latest Ref Rng & Units 10/24/2019 01/17/2019 02/25/2018  Total Protein 6.1 - 8.1 g/dL 7.2 7.5 7.1  Albumin 3.6 - 5.1 g/dL - - -  AST 10 - 35 U/L _0 ALT 9 - 46 U/L _1 Alk Phosphatase 40 - 115 U/L - - -  Total Bilirubin 0.2 - 1.2 mg/dL 0.2 0.3 0.2     The 10-year ASCVD risk score (Mikey BussingDC Jr., et al., 2013) is: 23.9%   Values used  to calculate the score:     Age: 27 years     Sex: Male     Is Non-Hispanic African American: Yes     Diabetic: Yes     Tobacco smoker: No     Systolic Blood Pressure: 299 mmHg     Is BP treated: No     HDL Cholesterol: 53 mg/dL     Total Cholesterol: 143 mg/dL   Patient has failed these meds in past: none noted Patient is currently controlled on the following medications:  Rosuvastatin 40 mg daily  Patient reports compliance and proper timing of medication.  Denies myalgias.  LDL WNL during most recent lipid panel.  Patient reports good overall diet.  Plan  Continue current medications GERD   Patient has failed these meds in past: PPIs Patient is currently controlled on the following medications:  None currently  Patient does mention slight symptoms of acid reflux after certain trigger foods, especially spicy.  Currently just deals with the symptoms with no medication.  Discussed Tums  as option for short term relief.  Plan Recommend Tums OTC as option for short term relief of acid reflux.    Gout    Patient has failed these meds in past: uloric Patient is currently controlled on the following medications:  Allopurinol 111m three tablets daily  Patient has not had gout attack in quite a while.  No current complaints  Plan  Continue current medications  Vaccines   Reviewed and discussed patient's vaccination history.    Immunization History  Administered Date(s) Administered   Fluad Quad(high Dose 65+) 02/14/2019   Influenza Split 02/22/2013, 02/22/2014   Influenza-Unspecified 02/19/2015, 02/13/2016, 02/22/2017   PFIZER SARS-COV-2 Vaccination 06/19/2019, 07/10/2019   PPD Test 09/13/2012, 08/02/2013, 08/20/2014, 09/03/2015, 09/21/2016   Pneumococcal Conjugate-13 09/24/2017   Pneumococcal Polysaccharide-23 05/11/2014   Tdap 09/10/2010   Zoster 09/13/2012    Plan  Recommended patient receive Shingrix vaccine in office.  Medication Management   Miscellaneous medications:  Viagra 1036mmeloxicam 7.87m63maily OTC's:  ASA 23m53mtient currently uses [facility] pharmacy.  Phone #  [phone] reports using no specific method to organize medications and promote adherence. Patient denies missed doses of medication.   ChriBeverly MilcharmD Clinical Pharmacist [phone] collaborated with the care management provider regarding care management and care coordination activities outlined in this encounter and have reviewed this encounter including documentation in the note and care plan. I am certifying that I agree with the content of this note and encounter as supervising physician.

## 2019-12-05 ENCOUNTER — Ambulatory Visit: Payer: Medicare Other

## 2019-12-05 ENCOUNTER — Other Ambulatory Visit: Payer: Self-pay

## 2019-12-05 DIAGNOSIS — E119 Type 2 diabetes mellitus without complications: Secondary | ICD-10-CM

## 2019-12-05 DIAGNOSIS — M109 Gout, unspecified: Secondary | ICD-10-CM

## 2019-12-05 DIAGNOSIS — K219 Gastro-esophageal reflux disease without esophagitis: Secondary | ICD-10-CM

## 2019-12-05 DIAGNOSIS — E785 Hyperlipidemia, unspecified: Secondary | ICD-10-CM

## 2019-12-05 NOTE — Patient Instructions (Addendum)
Visit Information Thank you for meeting with me today!  I look forward to working with you to help you meet all of your healthcare goals and answer any questions you may have.  Feel free to contact me anytime!  Goals Addressed            This Visit's Progress   . Pharmacy Care Plan:       CARE PLAN ENTRY (see longitudinal plan of care for additional care plan information)  Current Barriers:  . Chronic Disease Management support, education, and care coordination needs related to Diabetes, GERD, Gout, and dyslipidemia  Dyslipidemia Lab Results  Component Value Date/Time   LDLCALC 71 10/24/2019 08:50 AM   . Pharmacist Clinical Goal(s): o Over the next 180 days, patient will work with PharmD and providers to maintain LDL goal < 70 . Current regimen:  o Rosuvastatin 40mg  . Interventions: o Reviewed most recent lipid panel o Counseled on importance of statin medications for overall heart benefit . Patient self care activities - Over the next 180 days, patient will: o Continue to take medication as directed o Focus on medication adherence by pill count  Diabetes Lab Results  Component Value Date/Time   HGBA1C 6.1 (H) 10/24/2019 08:50 AM   HGBA1C 6.4 (H) 01/17/2019 08:10 AM   . Pharmacist Clinical Goal(s): o Over the next 180 days, patient will work with PharmD and providers to maintain A1c goal <7% . Current regimen:  o Trulicity 1.5mg  weekly o Levemir 25 units daily . Interventions: o Reviewed home blood sugar readings o Reviewed most recent A1c  o Discussed current diet o Evaluated for financial hardships for brand name medications . Patient self care activities - Over the next 180 days, patient will: o Check blood sugar once daily, document, and provide at future appointments o Contact provider with any episodes of hypoglycemia o Contact provider should you not be able to afford either medication for blood sugar  GERD . Pharmacist Clinical Goal(s) o Over the next  180 days, patient will work with PharmD and providers to optimize medication and minimize symptoms related to GERD. . Current regimen:  o None . Interventions: o Discussed frequency of symptoms. o Discussed trigger foods. . Patient self care activities - Over the next 180 days, patient will: o Contact provider with any increase in symptom frequency. o May take OTC Tums periodically for symptom relief. Gout . Pharmacist Clinical Goal(s) o Over the next 180 days, patient will work with PharmD and providers to optimize medication and minimize symptoms related to gout. . Current regimen:  o Allopurinol 100mg  three tablets daily . Interventions: o Discussed frequency of gout symptoms . Patient self care activities - Over the next 180 days, patient will: o Continue to take medication as directed o Contact providers with increase in symptoms   Initial goal documentation        Curtis Hunt was given information about Chronic Care Management services today including:  1. CCM service includes personalized support from designated clinical staff supervised by his physician, including individualized plan of care and coordination with other care providers 2. 24/7 contact phone numbers for assistance for urgent and routine care needs. 3. Standard insurance, coinsurance, copays and deductibles apply for chronic care management only during months in which we provide at least 20 minutes of these services. Most insurances cover these services at 100%, however patients may be responsible for any copay, coinsurance and/or deductible if applicable. This service may help you avoid the need for  more expensive face-to-face services. 4. Only one practitioner may furnish and bill the service in a calendar month. 5. The patient may stop CCM services at any time (effective at the end of the month) by phone call to the office staff.  Patient agreed to services and verbal consent obtained.   The patient  verbalized understanding of instructions provided today and agreed to receive a mailed copy of patient instruction and/or educational materials. Telephone follow up appointment with pharmacy team member scheduled for: 6 months  Beverly Milch, PharmD Clinical Pharmacist [facility] [phone]   Diabetes Mellitus and Exercise Exercising regularly is important for your overall health, especially when you have diabetes (diabetes mellitus). Exercising is not only about losing weight. It has many other health benefits, such as increasing muscle strength and bone density and reducing body fat and stress. This leads to improved fitness, flexibility, and endurance, all of which result in better overall health. Exercise has additional benefits for people with diabetes, including:  Reducing appetite.  Helping to lower and control blood glucose.  Lowering blood pressure.  Helping to control amounts of fatty substances (lipids) in the blood, such as cholesterol and triglycerides.  Helping the body to respond better to insulin (improving insulin sensitivity).  Reducing how much insulin the body needs.  Decreasing the risk for heart disease by: ? Lowering cholesterol and triglyceride levels. ? Increasing the levels of good cholesterol. ? Lowering blood glucose levels. What is my activity plan? Your health care provider or certified diabetes educator can help you make a plan for the type and frequency of exercise (activity plan) that works for you. Make sure that you:  Do at least 150 minutes of moderate-intensity or vigorous-intensity exercise each week. This could be brisk walking, biking, or water aerobics. ? Do stretching and strength exercises, such as yoga or weightlifting, at least 2 times a week. ? Spread out your activity over at least 3 days of the week.  Get some form of physical activity every day. ? Do not go more than 2 days in a row without some kind of  physical activity. ? Avoid being inactive for more than 30 minutes at a time. Take frequent breaks to walk or stretch.  Choose a type of exercise or activity that you enjoy, and set realistic goals.  Start slowly, and gradually increase the intensity of your exercise over time. What do I need to know about managing my diabetes?   Check your blood glucose before and after exercising. ? If your blood glucose is 240 mg/dL (13.3 mmol/L) or higher before you exercise, check your urine for ketones. If you have ketones in your urine, do not exercise until your blood glucose returns to normal. ? If your blood glucose is 100 mg/dL (5.6 mmol/L) or lower, eat a snack containing 15-20 grams of carbohydrate. Check your blood glucose 15 minutes after the snack to make sure that your level is above 100 mg/dL (5.6 mmol/L) before you start your exercise.  Know the symptoms of low blood glucose (hypoglycemia) and how to treat it. Your risk for hypoglycemia increases during and after exercise. Common symptoms of hypoglycemia can include: ? Hunger. ? Anxiety. ? Sweating and feeling clammy. ? Confusion. ? Dizziness or feeling light-headed. ? Increased heart rate or palpitations. ? Blurry vision. ? Tingling or numbness around the mouth, lips, or tongue. ? Tremors or shakes. ? Irritability.  Keep a rapid-acting carbohydrate snack available before, during, and after exercise to help prevent  or treat hypoglycemia.  Avoid injecting insulin into areas of the body that are going to be exercised. For example, avoid injecting insulin into: ? The arms, when playing tennis. ? The legs, when jogging.  Keep records of your exercise habits. Doing this can help you and your health care provider adjust your diabetes management plan as needed. Write down: ? Food that you eat before and after you exercise. ? Blood glucose levels before and after you exercise. ? The type and amount of exercise you have done. ? When your  insulin is expected to peak, if you use insulin. Avoid exercising at times when your insulin is peaking.  When you start a new exercise or activity, work with your health care provider to make sure the activity is safe for you, and to adjust your insulin, medicines, or food intake as needed.  Drink plenty of water while you exercise to prevent dehydration or heat stroke. Drink enough fluid to keep your urine clear or pale yellow. Summary  Exercising regularly is important for your overall health, especially when you have diabetes (diabetes mellitus).  Exercising has many health benefits, such as increasing muscle strength and bone density and reducing body fat and stress.  Your health care provider or certified diabetes educator can help you make a plan for the type and frequency of exercise (activity plan) that works for you.  When you start a new exercise or activity, work with your health care provider to make sure the activity is safe for you, and to adjust your insulin, medicines, or food intake as needed. This information is not intended to replace advice given to you by your health care provider. Make sure you discuss any questions you have with your health care provider. Document Revised: 12/03/2016 Document Reviewed: 10/21/2015 Elsevier Patient Education  [place].

## 2019-12-13 NOTE — Progress Notes (Signed)
Your procedure is scheduled on Monday July 26.  Report to [facility] Main Entrance "A" at 10:15 A.M., and check in at the Admitting office.  Call this number if you have problems the morning of surgery: [phone]  Call [phone] if you have any questions prior to your surgery date Monday-Friday 8am-4pm   Remember: Do not eat after midnight the night before your surgery  You may drink clear liquids until 09:15 A.M. the morning of your surgery.   Clear liquids allowed are: Water, Non-Citrus Juices (without pulp), Carbonated Beverages, Clear Tea, Black Coffee Only, and Gatorade  Please complete your PRE-SURGERY G2 that was provided to you by 09:15 A.M. the morning of surgery.  ** Please, if able, drink it in one setting. DO NOT SIP.    Take these medicines the morning of surgery with A SIP OF WATER: rosuvastatin (CRESTOR)   Follow your surgeon's instructions on when to stop Aspirin.  If no instructions were given by your surgeon then you will need to call the office to get those instructions.    As of today, STOP taking any Aspirin containing products, Aleve, Naproxen, Ibuprofen, Motrin, Advil, Goody's, BC's, all herbal medications, fish oil, and all vitamins.   WHAT DO I DO ABOUT MY DIABETES MEDICATION?   . THE DAY/NIGHT BEFORE SURGERY  o insulin detemir (LEVEMIR FLEXTOUCH)  - 10 PM dose - 10 units   . THE MORNING OF SURGERY o  insulin detemir (LEVEMIR FLEXTOUCH) - none   Do not take oral diabetes medicines (pills) the morning of surgery.  . The day of surgery, DO NOT take other diabetes injectables, including Byetta (exenatide), Bydureon (exenatide ER), Victoza (liraglutide), or Trulicity (dulaglutide).   . If your CBG is greater than 220 mg/dL, you may take  of your sliding scale (correction) dose of insulin.   HOW TO MANAGE YOUR DIABETES BEFORE AND AFTER SURGERY  Why is it important to control my blood sugar before and after surgery? . Improving blood sugar  levels before and after surgery helps healing and can limit problems. . A way of improving blood sugar control is eating a healthy diet by: o  Eating less sugar and carbohydrates o  Increasing activity/exercise o  Talking with your doctor about reaching your blood sugar goals . High blood sugars (greater than 180 mg/dL) can raise your risk of infections and slow your recovery, so you will need to focus on controlling your diabetes during the weeks before surgery. . Make sure that the doctor who takes care of your diabetes knows about your planned surgery including the date and location.  How do I manage my blood sugar before surgery? . Check your blood sugar at least 4 times a day, starting 2 days before surgery, to make sure that the level is not too high or low. . Check your blood sugar the morning of your surgery when you wake up and every 2 hours until you get to the Short Stay unit. o If your blood sugar is less than 70 mg/dL, you will need to treat for low blood sugar: - Do not take insulin. - Treat a low blood sugar (less than 70 mg/dL) with  cup of clear juice (cranberry or apple), 4 glucose tablets, OR glucose gel. - Recheck blood sugar in 15 minutes after treatment (to make sure it is greater than 70 mg/dL). If your blood sugar is not greater than 70 mg/dL on recheck, call [phone] for further instructions. . Report your blood sugar  to the short stay nurse when you get to Short Stay.  . If you are admitted to the hospital after surgery: o Your blood sugar will be checked by the staff and you will probably be given insulin after surgery (instead of oral diabetes medicines) to make sure you have good blood sugar levels. o The goal for blood sugar control after surgery is 80-180 mg/dL.   The Morning of Surgery  Do not wear jewelry.  Do not wear lotions, powders, colognes, or deodorant Men may shave face and neck.  Do not bring valuables to the hospital.  [facility] is not  responsible for any belongings or valuables.  If you are a smoker, DO NOT Smoke 24 hours prior to surgery  If you wear a CPAP at night please bring your mask the morning of surgery   Remember that you must have someone to transport you home after your surgery, and remain with you for 24 hours if you are discharged the same day.   Please bring cases for contacts, glasses, hearing aids, dentures or bridgework because it cannot be worn into surgery.    Leave your suitcase in the car.  After surgery it may be brought to your room.  For patients admitted to the hospital, discharge time will be determined by your treatment team.  Patients discharged the day of surgery will not be allowed to drive home.    Special instructions:   [place]- Preparing For Surgery  Before surgery, you can play an important role. Because skin is not sterile, your skin needs to be as free of germs as possible. You can reduce the number of germs on your skin by washing with CHG (chlorahexidine gluconate) Soap before surgery.  CHG is an antiseptic cleaner which kills germs and bonds with the skin to continue killing germs even after washing.    Oral Hygiene is also important to reduce your risk of infection.  Remember - BRUSH YOUR TEETH THE MORNING OF SURGERY WITH YOUR REGULAR TOOTHPASTE  Please do not use if you have an allergy to CHG or antibacterial soaps. If your skin becomes reddened/irritated stop using the CHG.  Do not shave (including legs and underarms) for at least 48 hours prior to first CHG shower. It is OK to shave your face.  Please follow these instructions carefully.   1. Shower the NIGHT BEFORE SURGERY and the MORNING OF SURGERY with CHG Soap.   2. If you chose to wash your hair and body, wash as usual with your normal shampoo and body-wash/soap.  3. Rinse your hair and body thoroughly to remove the shampoo and soap.  4. Apply CHG directly to the skin (ONLY FROM THE NECK DOWN) and wash  gently with a scrungie or a clean washcloth.   5. Do not use on open wounds or open sores. Avoid contact with your eyes, ears, mouth and genitals (private parts). Wash Face and genitals (private parts)  with your normal soap.   6. Wash thoroughly, paying special attention to the area where your surgery will be performed.  7. Thoroughly rinse your body with warm water from the neck down.  8. DO NOT shower/wash with your normal soap after using and rinsing off the CHG Soap.  9. Pat yourself dry with a CLEAN TOWEL.  10. Wear CLEAN PAJAMAS to bed the night before surgery  11. Place CLEAN SHEETS on your bed the night of your first shower and DO NOT SLEEP WITH PETS.  12. Wear  comfortable clothes the morning of surgery.     Day of Surgery:  Please shower the morning of surgery with the CHG soap Do not apply any deodorants/lotions. Please wear clean clothes to the hospital/surgery center.   Remember to brush your teeth WITH YOUR REGULAR TOOTHPASTE.   Please read over the following fact sheets that you were given.

## 2019-12-14 ENCOUNTER — Other Ambulatory Visit (HOSPITAL_COMMUNITY)
Admission: RE | Admit: 2019-12-14 | Discharge: 2019-12-14 | Disposition: A | Discharge status: Home / Self Care | Payer: Medicare Other | Source: Ambulatory Visit | Attending: Orthopaedic Surgery | Admitting: Orthopaedic Surgery

## 2019-12-14 ENCOUNTER — Other Ambulatory Visit: Payer: Self-pay

## 2019-12-14 ENCOUNTER — Encounter (HOSPITAL_COMMUNITY): Payer: Self-pay

## 2019-12-14 ENCOUNTER — Encounter (HOSPITAL_COMMUNITY)
Admission: RE | Admit: 2019-12-14 | Discharge: 2019-12-14 | Disposition: A | Discharge status: Home / Self Care | Payer: Medicare Other | Source: Ambulatory Visit | Attending: Physician Assistant | Admitting: Physician Assistant

## 2019-12-14 ENCOUNTER — Encounter (HOSPITAL_COMMUNITY)
Admission: RE | Admit: 2019-12-14 | Discharge: 2019-12-14 | Disposition: A | Discharge status: Home / Self Care | Payer: Medicare Other | Source: Ambulatory Visit | Attending: Orthopaedic Surgery | Admitting: Orthopaedic Surgery

## 2019-12-14 DIAGNOSIS — Z20822 Contact with and (suspected) exposure to covid-19: Secondary | ICD-10-CM | POA: Insufficient documentation

## 2019-12-14 DIAGNOSIS — Z01818 Encounter for other preprocedural examination: Secondary | ICD-10-CM | POA: Diagnosis not present

## 2019-12-14 DIAGNOSIS — M47814 Spondylosis without myelopathy or radiculopathy, thoracic region: Secondary | ICD-10-CM | POA: Diagnosis not present

## 2019-12-14 DIAGNOSIS — Z96641 Presence of right artificial hip joint: Secondary | ICD-10-CM | POA: Diagnosis not present

## 2019-12-14 DIAGNOSIS — M1611 Unilateral primary osteoarthritis, right hip: Secondary | ICD-10-CM | POA: Diagnosis not present

## 2019-12-14 HISTORY — DX: Unspecified osteoarthritis, unspecified site: M19.90

## 2019-12-14 LAB — URINALYSIS, ROUTINE W REFLEX MICROSCOPIC
Bilirubin Urine: NEGATIVE
Glucose, UA: NEGATIVE mg/dL
Ketones, ur: NEGATIVE mg/dL
Nitrite: POSITIVE — AB
Protein, ur: NEGATIVE mg/dL
Specific Gravity, Urine: 1.016 (ref 1.005–1.030)
pH: 5 (ref 5.0–8.0)

## 2019-12-14 LAB — COMPREHENSIVE METABOLIC PANEL
ALT: 15 U/L (ref 0–44)
AST: 21 U/L (ref 15–41)
Albumin: 3.8 g/dL (ref 3.5–5.0)
Alkaline Phosphatase: 70 U/L (ref 38–126)
Anion gap: 11 (ref 5–15)
BUN: 20 mg/dL (ref 8–23)
CO2: 25 mmol/L (ref 22–32)
Calcium: 9.5 mg/dL (ref 8.9–10.3)
Chloride: 103 mmol/L (ref 98–111)
Creatinine, Ser: 1.53 mg/dL — ABNORMAL HIGH (ref 0.61–1.24)
GFR calc Af Amer: 53 mL/min — ABNORMAL LOW (ref >60)
GFR calc non Af Amer: 46 mL/min — ABNORMAL LOW (ref >60)
Glucose, Bld: 100 mg/dL — ABNORMAL HIGH (ref 70–99)
Potassium: 4 mmol/L (ref 3.5–5.1)
Sodium: 139 mmol/L (ref 135–145)
Total Bilirubin: 0.5 mg/dL (ref 0.3–1.2)
Total Protein: 8.2 g/dL — ABNORMAL HIGH (ref 6.5–8.1)

## 2019-12-14 LAB — CBC WITH DIFFERENTIAL/PLATELET
Abs Immature Granulocytes: 0.01 10*3/uL (ref 0.00–0.07)
Basophils Absolute: 0.1 10*3/uL (ref 0.0–0.1)
Basophils Relative: 2 %
Eosinophils Absolute: 0.1 10*3/uL (ref 0.0–0.5)
Eosinophils Relative: 2 %
HCT: 42.4 % (ref 39.0–52.0)
Hemoglobin: 13 g/dL (ref 13.0–17.0)
Immature Granulocytes: 0 %
Lymphocytes Relative: 35 %
Lymphs Abs: 2.2 10*3/uL (ref 0.7–4.0)
MCH: 28 pg (ref 26.0–34.0)
MCHC: 30.7 g/dL (ref 30.0–36.0)
MCV: 91.2 fL (ref 80.0–100.0)
Monocytes Absolute: 0.5 10*3/uL (ref 0.1–1.0)
Monocytes Relative: 8 %
Neutro Abs: 3.3 10*3/uL (ref 1.7–7.7)
Neutrophils Relative %: 53 %
Platelets: 268 10*3/uL (ref 150–400)
RBC: 4.65 MIL/uL (ref 4.22–5.81)
RDW: 14.5 % (ref 11.5–15.5)
WBC: 6.3 10*3/uL (ref 4.0–10.5)
nRBC: 0 % (ref 0.0–0.2)

## 2019-12-14 LAB — PROTIME-INR
INR: 0.9 (ref 0.8–1.2)
Prothrombin Time: 12.2 seconds (ref 11.4–15.2)

## 2019-12-14 LAB — SARS CORONAVIRUS 2 (TAT 6-24 HRS): SARS Coronavirus 2: NEGATIVE

## 2019-12-14 LAB — TYPE AND SCREEN
ABO/RH(D): O POS
Antibody Screen: NEGATIVE

## 2019-12-14 LAB — GLUCOSE, CAPILLARY: Glucose-Capillary: 91 mg/dL (ref 70–99)

## 2019-12-14 LAB — SURGICAL PCR SCREEN
MRSA, PCR: NEGATIVE
Staphylococcus aureus: NEGATIVE

## 2019-12-14 LAB — APTT: aPTT: 32 seconds (ref 24–36)

## 2019-12-14 NOTE — Progress Notes (Signed)
PCP - DR Dennard Schaumann [place] Cardiologist - NA  -st x-ray - FOR TODAY EKG - FOR TODAY Stress Test - NA ECHO - NA Cardiac Cath - NA      Fasting Blood Sugar - 91 Checks Blood Sugar _2____ times a day   Aspirin Instructions:STOP  ERAS Protcol -YES PRE-SURGERY Ensure or G2-  G2 GIVEN WITH INSTRUCTIONS  COVID TEST- FOR TODAY   Anesthesia review: REVIEW EKG  Patient denies shortness of breath, fever, cough and chest pain at PAT appointment   All instructions explained to the patient, with a verbal understanding of the material. Patient agrees to go over the instructions while at home for a better understanding. Patient also instructed to self quarantine after being tested for COVID-19. The opportunity to ask questions was provided.

## 2019-12-15 ENCOUNTER — Telehealth: Payer: Self-pay | Admitting: *Deleted

## 2019-12-15 MED ORDER — DEXTROSE 5 % IV SOLN
3.0000 g | INTRAVENOUS | Status: AC
  Administered 2019-12-18: 3 g via INTRAVENOUS
  Filled 2019-12-15: qty 3

## 2019-12-15 MED ORDER — TRANEXAMIC ACID 1000 MG/10ML IV SOLN
2000.0000 mg | INTRAVENOUS | Status: AC
  Administered 2019-12-18: 2000 mg via TOPICAL
  Filled 2019-12-15: qty 20

## 2019-12-15 MED ORDER — BUPIVACAINE LIPOSOME 1.3 % IJ SUSP
10.0000 mL | Freq: Once | INTRAMUSCULAR | Status: DC
  Filled 2019-12-15: qty 10

## 2019-12-15 NOTE — Care Plan (Signed)
RNCM spoke with patient prior to surgery to discuss his upcoming Right total hip arthroplasty with Dr. Erlinda Hong on Monday, 12/18/19. Patient is an Ortho bundle patient through [facility] and is agreeable to case management. Reviewed all pre- and post-op instructions related to care. Patient has family and friends that will be able to assist after surgery. He will need a FWW and 3in/BSC. Referral made to [place] for delivery prior to discharge. Anticipate HHPT will be needed after a short hospital stay. Choice provided and referral to [facility] made. Will continue to follow for CM needs.

## 2019-12-15 NOTE — Telephone Encounter (Signed)
Ortho bundle pre-op call completed. 

## 2019-12-18 ENCOUNTER — Other Ambulatory Visit: Payer: Self-pay

## 2019-12-18 ENCOUNTER — Ambulatory Visit (HOSPITAL_COMMUNITY): Payer: Medicare Other | Admitting: Anesthesiology

## 2019-12-18 ENCOUNTER — Encounter (HOSPITAL_COMMUNITY): Payer: Self-pay | Admitting: Orthopaedic Surgery

## 2019-12-18 ENCOUNTER — Encounter (HOSPITAL_COMMUNITY)
Admission: RE | Disposition: A | Discharge status: Home / Self Care | Payer: Self-pay | Source: Home / Self Care | Attending: Orthopaedic Surgery

## 2019-12-18 ENCOUNTER — Observation Stay (HOSPITAL_COMMUNITY): Payer: Medicare Other

## 2019-12-18 ENCOUNTER — Ambulatory Visit (HOSPITAL_COMMUNITY): Payer: Medicare Other

## 2019-12-18 ENCOUNTER — Observation Stay (HOSPITAL_COMMUNITY)
Admission: RE | Admit: 2019-12-18 | Discharge: 2019-12-19 | Disposition: A | Discharge status: Home / Self Care | Payer: Medicare Other | Attending: Orthopaedic Surgery | Admitting: Orthopaedic Surgery

## 2019-12-18 DIAGNOSIS — Z8546 Personal history of malignant neoplasm of prostate: Secondary | ICD-10-CM | POA: Diagnosis not present

## 2019-12-18 DIAGNOSIS — Z794 Long term (current) use of insulin: Secondary | ICD-10-CM | POA: Insufficient documentation

## 2019-12-18 DIAGNOSIS — E119 Type 2 diabetes mellitus without complications: Secondary | ICD-10-CM | POA: Insufficient documentation

## 2019-12-18 DIAGNOSIS — Z96641 Presence of right artificial hip joint: Secondary | ICD-10-CM | POA: Insufficient documentation

## 2019-12-18 DIAGNOSIS — M1611 Unilateral primary osteoarthritis, right hip: Secondary | ICD-10-CM | POA: Diagnosis not present

## 2019-12-18 DIAGNOSIS — Z471 Aftercare following joint replacement surgery: Secondary | ICD-10-CM | POA: Diagnosis not present

## 2019-12-18 DIAGNOSIS — Z7982 Long term (current) use of aspirin: Secondary | ICD-10-CM | POA: Insufficient documentation

## 2019-12-18 DIAGNOSIS — E1110 Type 2 diabetes mellitus with ketoacidosis without coma: Secondary | ICD-10-CM | POA: Diagnosis not present

## 2019-12-18 DIAGNOSIS — Z96649 Presence of unspecified artificial hip joint: Secondary | ICD-10-CM

## 2019-12-18 DIAGNOSIS — Z419 Encounter for procedure for purposes other than remedying health state, unspecified: Secondary | ICD-10-CM

## 2019-12-18 HISTORY — PX: JOINT REPLACEMENT: SHX530

## 2019-12-18 HISTORY — PX: TOTAL HIP ARTHROPLASTY: SHX124

## 2019-12-18 LAB — GLUCOSE, CAPILLARY
Glucose-Capillary: 101 mg/dL — ABNORMAL HIGH (ref 70–99)
Glucose-Capillary: 81 mg/dL (ref 70–99)
Glucose-Capillary: 108 mg/dL — ABNORMAL HIGH (ref 70–99)
Glucose-Capillary: 192 mg/dL — ABNORMAL HIGH (ref 70–99)

## 2019-12-18 LAB — PREALBUMIN: Prealbumin: 27.3 mg/dL (ref 18–38)

## 2019-12-18 LAB — ABO/RH: ABO/RH(D): O POS

## 2019-12-18 SURGERY — ARTHROPLASTY, HIP, TOTAL, ANTERIOR APPROACH
Anesthesia: Spinal | Site: Hip | Laterality: Right

## 2019-12-18 MED ORDER — PROPOFOL 10 MG/ML IV BOLUS
INTRAVENOUS | Status: AC
  Filled 2019-12-18: qty 20

## 2019-12-18 MED ORDER — FENTANYL CITRATE (PF) 100 MCG/2ML IJ SOLN: 25.0000 ug | INTRAMUSCULAR | Status: DC | PRN

## 2019-12-18 MED ORDER — ASPIRIN EC 81 MG PO TBEC
81.0000 mg | DELAYED_RELEASE_TABLET | Freq: Two times a day (BID) | ORAL | 0 refills | Status: DC
Start: 2019-12-18 — End: 2020-01-22

## 2019-12-18 MED ORDER — TRANEXAMIC ACID-NACL 1000-0.7 MG/100ML-% IV SOLN
1000.0000 mg | INTRAVENOUS | Status: AC
  Administered 2019-12-18: 1000 mg via INTRAVENOUS

## 2019-12-18 MED ORDER — ACETAMINOPHEN 500 MG PO TABS: 1000.0000 mg | ORAL_TABLET | Freq: Once | ORAL | Status: AC

## 2019-12-18 MED ORDER — METHOCARBAMOL 750 MG PO TABS
750.0000 mg | ORAL_TABLET | Freq: Two times a day (BID) | ORAL | 3 refills | Status: DC | PRN
Start: 2019-12-18 — End: 2020-09-23

## 2019-12-18 MED ORDER — DIPHENHYDRAMINE HCL 12.5 MG/5ML PO ELIX
25.0000 mg | ORAL_SOLUTION | ORAL | Status: DC | PRN
  Filled 2019-12-18: qty 10

## 2019-12-18 MED ORDER — ROSUVASTATIN CALCIUM 20 MG PO TABS
40.0000 mg | ORAL_TABLET | Freq: Every day | ORAL | Status: DC
  Administered 2019-12-18 – 2019-12-19 (×2): 40 mg via ORAL
  Filled 2019-12-18 (×2): qty 2

## 2019-12-18 MED ORDER — ONDANSETRON HCL 4 MG PO TABS: 4.0000 mg | ORAL_TABLET | Freq: Three times a day (TID) | ORAL | 0 refills | Status: DC | PRN

## 2019-12-18 MED ORDER — METOCLOPRAMIDE HCL 5 MG PO TABS: 5.0000 mg | ORAL_TABLET | Freq: Three times a day (TID) | ORAL | Status: DC | PRN

## 2019-12-18 MED ORDER — KETOROLAC TROMETHAMINE 15 MG/ML IJ SOLN
15.0000 mg | Freq: Four times a day (QID) | INTRAMUSCULAR | Status: DC
  Administered 2019-12-18 – 2019-12-19 (×3): 15 mg via INTRAVENOUS
  Filled 2019-12-18 (×3): qty 1

## 2019-12-18 MED ORDER — SODIUM CHLORIDE 0.9 % IR SOLN
Status: DC | PRN
  Administered 2019-12-18: 1000 mL

## 2019-12-18 MED ORDER — VANCOMYCIN HCL 1 G IV SOLR
INTRAVENOUS | Status: DC | PRN
  Administered 2019-12-18: 1000 mg via TOPICAL

## 2019-12-18 MED ORDER — INSULIN ASPART 100 UNIT/ML ~~LOC~~ SOLN: 0.0000 [IU] | Freq: Every day | SUBCUTANEOUS | Status: DC

## 2019-12-18 MED ORDER — OXYCODONE HCL 5 MG PO TABS
5.0000 mg | ORAL_TABLET | ORAL | Status: DC | PRN
  Administered 2019-12-18: 10 mg via ORAL

## 2019-12-18 MED ORDER — BUPIVACAINE HCL 0.25 % IJ SOLN
INTRAMUSCULAR | Status: DC | PRN
  Administered 2019-12-18: 20 mL

## 2019-12-18 MED ORDER — ASPIRIN 81 MG PO CHEW
81.0000 mg | CHEWABLE_TABLET | Freq: Two times a day (BID) | ORAL | Status: DC
  Administered 2019-12-18 – 2019-12-19 (×2): 81 mg via ORAL
  Filled 2019-12-18 (×2): qty 1

## 2019-12-18 MED ORDER — MENTHOL 3 MG MT LOZG: 1.0000 | LOZENGE | OROMUCOSAL | Status: DC | PRN

## 2019-12-18 MED ORDER — PHENYLEPHRINE HCL-NACL 10-0.9 MG/250ML-% IV SOLN
INTRAVENOUS | Status: DC | PRN
  Administered 2019-12-18: 30 ug/min via INTRAVENOUS

## 2019-12-18 MED ORDER — ONDANSETRON HCL 4 MG/2ML IJ SOLN: 4.0000 mg | Freq: Four times a day (QID) | INTRAMUSCULAR | Status: DC | PRN

## 2019-12-18 MED ORDER — BUPIVACAINE IN DEXTROSE 0.75-8.25 % IT SOLN
INTRATHECAL | Status: DC | PRN
  Administered 2019-12-18: 1.6 mL via INTRATHECAL

## 2019-12-18 MED ORDER — LACTATED RINGERS IV SOLN: INTRAVENOUS | Status: DC

## 2019-12-18 MED ORDER — ONDANSETRON HCL 4 MG PO TABS: 4.0000 mg | ORAL_TABLET | Freq: Four times a day (QID) | ORAL | Status: DC | PRN

## 2019-12-18 MED ORDER — OXYCODONE HCL 5 MG PO TABS: 10.0000 mg | ORAL_TABLET | ORAL | Status: DC | PRN

## 2019-12-18 MED ORDER — FENTANYL CITRATE (PF) 250 MCG/5ML IJ SOLN
INTRAMUSCULAR | Status: AC
  Filled 2019-12-18: qty 5

## 2019-12-18 MED ORDER — ONDANSETRON HCL 4 MG/2ML IJ SOLN
INTRAMUSCULAR | Status: DC | PRN
  Administered 2019-12-18: 4 mg via INTRAVENOUS

## 2019-12-18 MED ORDER — CHLORHEXIDINE GLUCONATE 0.12 % MT SOLN
OROMUCOSAL | Status: AC
  Administered 2019-12-18: 15 mL via OROMUCOSAL
  Filled 2019-12-18: qty 15

## 2019-12-18 MED ORDER — CHLORHEXIDINE GLUCONATE 0.12 % MT SOLN: 15.0000 mL | Freq: Once | OROMUCOSAL | Status: AC

## 2019-12-18 MED ORDER — DEXAMETHASONE SODIUM PHOSPHATE 10 MG/ML IJ SOLN
10.0000 mg | Freq: Once | INTRAMUSCULAR | Status: DC
  Filled 2019-12-18: qty 1

## 2019-12-18 MED ORDER — SODIUM CHLORIDE 0.9 % IV SOLN
INTRAVENOUS | Status: DC | PRN
  Administered 2019-12-18: 40 mL

## 2019-12-18 MED ORDER — METOCLOPRAMIDE HCL 5 MG/ML IJ SOLN: 5.0000 mg | Freq: Three times a day (TID) | INTRAMUSCULAR | Status: DC | PRN

## 2019-12-18 MED ORDER — HYDROMORPHONE HCL 1 MG/ML IJ SOLN
0.5000 mg | INTRAMUSCULAR | Status: DC | PRN
  Administered 2019-12-18: 1 mg via INTRAVENOUS
  Filled 2019-12-18: qty 1

## 2019-12-18 MED ORDER — MAGNESIUM CITRATE PO SOLN: 1.0000 | Freq: Once | ORAL | Status: DC | PRN

## 2019-12-18 MED ORDER — POLYETHYLENE GLYCOL 3350 17 G PO PACK: 17.0000 g | PACK | Freq: Every day | ORAL | Status: DC | PRN

## 2019-12-18 MED ORDER — CEFAZOLIN SODIUM-DEXTROSE 2-4 GM/100ML-% IV SOLN
2.0000 g | Freq: Four times a day (QID) | INTRAVENOUS | Status: AC
  Administered 2019-12-18 – 2019-12-19 (×3): 2 g via INTRAVENOUS
  Filled 2019-12-18 (×3): qty 100

## 2019-12-18 MED ORDER — POVIDONE-IODINE 10 % EX SWAB: 2.0000 "application " | Freq: Once | CUTANEOUS | Status: DC

## 2019-12-18 MED ORDER — OXYCODONE-ACETAMINOPHEN 5-325 MG PO TABS: 1.0000 | ORAL_TABLET | Freq: Three times a day (TID) | ORAL | 0 refills | Status: DC | PRN

## 2019-12-18 MED ORDER — OXYCODONE HCL ER 10 MG PO T12A
10.0000 mg | EXTENDED_RELEASE_TABLET | Freq: Two times a day (BID) | ORAL | Status: DC
  Administered 2019-12-18 – 2019-12-19 (×2): 10 mg via ORAL
  Filled 2019-12-18 (×2): qty 1

## 2019-12-18 MED ORDER — DOCUSATE SODIUM 100 MG PO CAPS
100.0000 mg | ORAL_CAPSULE | Freq: Two times a day (BID) | ORAL | Status: DC
  Administered 2019-12-18 – 2019-12-19 (×2): 100 mg via ORAL
  Filled 2019-12-18 (×2): qty 1

## 2019-12-18 MED ORDER — PROPOFOL 500 MG/50ML IV EMUL
INTRAVENOUS | Status: DC | PRN
  Administered 2019-12-18: 100 ug/kg/min via INTRAVENOUS

## 2019-12-18 MED ORDER — SULFAMETHOXAZOLE-TRIMETHOPRIM 800-160 MG PO TABS
1.0000 | ORAL_TABLET | Freq: Two times a day (BID) | ORAL | 0 refills | Status: DC
Start: 2019-12-18 — End: 2020-09-23

## 2019-12-18 MED ORDER — DULAGLUTIDE 1.5 MG/0.5ML ~~LOC~~ SOAJ: 1.5000 mg | SUBCUTANEOUS | Status: DC

## 2019-12-18 MED ORDER — IRRISEPT - 450ML BOTTLE WITH 0.05% CHG IN STERILE WATER, USP 99.95% OPTIME
TOPICAL | Status: DC | PRN
  Administered 2019-12-18: 450 mL via TOPICAL

## 2019-12-18 MED ORDER — SORBITOL 70 % SOLN
30.0000 mL | Freq: Every day | Status: DC | PRN
  Filled 2019-12-18: qty 30

## 2019-12-18 MED ORDER — ACETAMINOPHEN 325 MG PO TABS: 325.0000 mg | ORAL_TABLET | Freq: Four times a day (QID) | ORAL | Status: DC | PRN

## 2019-12-18 MED ORDER — INSULIN ASPART 100 UNIT/ML ~~LOC~~ SOLN
0.0000 [IU] | Freq: Three times a day (TID) | SUBCUTANEOUS | Status: DC
  Administered 2019-12-19: 2 [IU] via SUBCUTANEOUS

## 2019-12-18 MED ORDER — INSULIN DETEMIR 100 UNIT/ML ~~LOC~~ SOLN
20.0000 [IU] | Freq: Every day | SUBCUTANEOUS | Status: DC
  Administered 2019-12-18: 20 [IU] via SUBCUTANEOUS
  Filled 2019-12-18 (×3): qty 0.2

## 2019-12-18 MED ORDER — SODIUM CHLORIDE 0.9 % IV SOLN: INTRAVENOUS | Status: DC

## 2019-12-18 MED ORDER — ALUM & MAG HYDROXIDE-SIMETH 200-200-20 MG/5ML PO SUSP: 30.0000 mL | ORAL | Status: DC | PRN

## 2019-12-18 MED ORDER — OXYCODONE HCL 5 MG PO TABS
ORAL_TABLET | ORAL | Status: AC
  Filled 2019-12-18: qty 2

## 2019-12-18 MED ORDER — TRANEXAMIC ACID-NACL 1000-0.7 MG/100ML-% IV SOLN
INTRAVENOUS | Status: AC
  Filled 2019-12-18: qty 100

## 2019-12-18 MED ORDER — ACETAMINOPHEN 500 MG PO TABS
ORAL_TABLET | ORAL | Status: AC
  Administered 2019-12-18: 1000 mg via ORAL
  Filled 2019-12-18: qty 2

## 2019-12-18 MED ORDER — 0.9 % SODIUM CHLORIDE (POUR BTL) OPTIME
TOPICAL | Status: DC | PRN
  Administered 2019-12-18: 1000 mL

## 2019-12-18 MED ORDER — ACETAMINOPHEN 500 MG PO TABS
1000.0000 mg | ORAL_TABLET | Freq: Four times a day (QID) | ORAL | Status: AC
  Administered 2019-12-18 – 2019-12-19 (×4): 1000 mg via ORAL
  Filled 2019-12-18 (×4): qty 2

## 2019-12-18 MED ORDER — GABAPENTIN 300 MG PO CAPS
300.0000 mg | ORAL_CAPSULE | Freq: Three times a day (TID) | ORAL | Status: DC
  Administered 2019-12-18 – 2019-12-19 (×3): 300 mg via ORAL
  Filled 2019-12-18 (×3): qty 1

## 2019-12-18 MED ORDER — FENTANYL CITRATE (PF) 250 MCG/5ML IJ SOLN
INTRAMUSCULAR | Status: DC | PRN
  Administered 2019-12-18: 50 ug via INTRAVENOUS

## 2019-12-18 MED ORDER — PHENOL 1.4 % MT LIQD: 1.0000 | OROMUCOSAL | Status: DC | PRN

## 2019-12-18 SURGICAL SUPPLY — 64 items
BAG DECANTER FOR FLEXI CONT (MISCELLANEOUS) ×3 IMPLANT
CELLS DAT CNTRL 66122 CELL SVR (MISCELLANEOUS) ×1 IMPLANT
COVER PERINEAL POST (MISCELLANEOUS) ×3 IMPLANT
COVER SURGICAL LIGHT HANDLE (MISCELLANEOUS) ×3 IMPLANT
COVER WAND RF STERILE (DRAPES) ×3 IMPLANT
CUP ACET PINNACLE SECTR 60MM (Hips) ×1 IMPLANT
DERMABOND ADVANCED (GAUZE/BANDAGES/DRESSINGS) ×2
DERMABOND ADVANCED .7 DNX12 (GAUZE/BANDAGES/DRESSINGS) ×1 IMPLANT
DRAPE C-ARM 42X72 X-RAY (DRAPES) ×3 IMPLANT
DRAPE POUCH INSTRU U-SHP 10X18 (DRAPES) ×3 IMPLANT
DRAPE STERI IOBAN 125X83 (DRAPES) ×3 IMPLANT
DRAPE U-SHAPE 47X51 STRL (DRAPES) ×6 IMPLANT
DRSG AQUACEL AG ADV 3.5X10 (GAUZE/BANDAGES/DRESSINGS) ×3 IMPLANT
DURAPREP 26ML APPLICATOR (WOUND CARE) ×6 IMPLANT
ELECT BLADE 4.0 EZ CLEAN MEGAD (MISCELLANEOUS) ×3
ELECT REM PT RETURN 9FT ADLT (ELECTROSURGICAL) ×3
ELECTRODE BLDE 4.0 EZ CLN MEGD (MISCELLANEOUS) ×1 IMPLANT
ELECTRODE REM PT RTRN 9FT ADLT (ELECTROSURGICAL) ×1 IMPLANT
GLOVE BIOGEL PI IND STRL 7.0 (GLOVE) ×1 IMPLANT
GLOVE BIOGEL PI INDICATOR 7.0 (GLOVE) ×2
GLOVE ECLIPSE 7.0 STRL STRAW (GLOVE) ×6 IMPLANT
GLOVE SKINSENSE NS SZ7.5 (GLOVE) ×2
GLOVE SKINSENSE STRL SZ7.5 (GLOVE) ×1 IMPLANT
GLOVE SURG SYN 7.5  E (GLOVE) ×8
GLOVE SURG SYN 7.5 E (GLOVE) ×4 IMPLANT
GOWN STRL REIN XL XLG (GOWN DISPOSABLE) ×3 IMPLANT
GOWN STRL REUS W/ TWL LRG LVL3 (GOWN DISPOSABLE) IMPLANT
GOWN STRL REUS W/ TWL XL LVL3 (GOWN DISPOSABLE) ×1 IMPLANT
GOWN STRL REUS W/TWL LRG LVL3 (GOWN DISPOSABLE)
GOWN STRL REUS W/TWL XL LVL3 (GOWN DISPOSABLE) ×3
HANDPIECE INTERPULSE COAX TIP (DISPOSABLE) ×2
HEAD CERAMIC 36 PLUS 8.5 12 14 (Hips) ×3 IMPLANT
HOOD PEEL AWAY FLYTE STAYCOOL (MISCELLANEOUS) ×6 IMPLANT
IV NS IRRIG 3000ML ARTHROMATIC (IV SOLUTION) ×3 IMPLANT
JET LAVAGE IRRISEPT WOUND (IRRIGATION / IRRIGATOR) ×3
KIT BASIN OR (CUSTOM PROCEDURE TRAY) ×3 IMPLANT
LAVAGE JET IRRISEPT WOUND (IRRIGATION / IRRIGATOR) ×1 IMPLANT
LINER NEUTRAL 58X36MM PLUS4 ×3 IMPLANT
MARKER SKIN DUAL TIP RULER LAB (MISCELLANEOUS) ×3 IMPLANT
NEEDLE SPNL 18GX3.5 QUINCKE PK (NEEDLE) ×3 IMPLANT
PACK TOTAL JOINT (CUSTOM PROCEDURE TRAY) ×3 IMPLANT
PACK UNIVERSAL I (CUSTOM PROCEDURE TRAY) ×3 IMPLANT
PINNSECTOR W/GRIP ACE CUP 60MM (Hips) ×3 IMPLANT
RTRCTR WOUND ALEXIS 18CM MED (MISCELLANEOUS) ×3
SAW OSC TIP CART 19.5X105X1.3 (SAW) ×6 IMPLANT
SCREW 6.5MMX25MM (Screw) ×3 IMPLANT
SET HNDPC FAN SPRY TIP SCT (DISPOSABLE) ×1 IMPLANT
STAPLER VISISTAT 35W (STAPLE) IMPLANT
STEM FEM ACTIS HIGH SZ3 (Stem) ×3 IMPLANT
SUT ETHIBOND 2 V 37 (SUTURE) ×3 IMPLANT
SUT ETHILON 2 0 PSLX (SUTURE) ×9 IMPLANT
SUT VIC AB 0 CT1 27 (SUTURE) ×3
SUT VIC AB 0 CT1 27XBRD ANBCTR (SUTURE) ×1 IMPLANT
SUT VIC AB 1 CTX 36 (SUTURE) ×2
SUT VIC AB 1 CTX36XBRD ANBCTR (SUTURE) ×1 IMPLANT
SUT VIC AB 2-0 CT1 27 (SUTURE) ×4
SUT VIC AB 2-0 CT1 TAPERPNT 27 (SUTURE) ×2 IMPLANT
SYR 50ML LL SCALE MARK (SYRINGE) ×3 IMPLANT
TIP HIGH FLOW IRRIGATION COAX (MISCELLANEOUS) ×3 IMPLANT
TOWEL GREEN STERILE (TOWEL DISPOSABLE) ×3 IMPLANT
TRAY CATH 16FR W/PLASTIC CATH (SET/KITS/TRAYS/PACK) IMPLANT
TRAY FOLEY W/BAG SLVR 16FR (SET/KITS/TRAYS/PACK) ×2
TRAY FOLEY W/BAG SLVR 16FR ST (SET/KITS/TRAYS/PACK) ×1 IMPLANT
YANKAUER SUCT BULB TIP NO VENT (SUCTIONS) ×3 IMPLANT

## 2019-12-18 NOTE — Anesthesia Preprocedure Evaluation (Addendum)
Anesthesia Evaluation  Patient identified by MRN, date of birth, ID band Patient awake    Reviewed: Allergy & Precautions, H&P , NPO status , Patient's Chart, lab work & pertinent test results  Airway Mallampati: II  TM Distance: >3 FB Neck ROM: Full    Dental no notable dental hx. (+) Teeth Intact, Dental Advisory Given   Pulmonary neg pulmonary ROS,    Pulmonary exam normal breath sounds clear to auscultation       Cardiovascular negative cardio ROS   Rhythm:Regular Rate:Normal     Neuro/Psych negative neurological ROS  negative psych ROS   GI/Hepatic Neg liver ROS, GERD  ,  Endo/Other  diabetes, Insulin Dependent  Renal/GU Renal InsufficiencyRenal disease  negative genitourinary   Musculoskeletal  (+) Arthritis , Osteoarthritis,    Abdominal   Peds  Hematology negative hematology ROS (+)   Anesthesia Other Findings   Reproductive/Obstetrics negative OB ROS                            Anesthesia Physical Anesthesia Plan  ASA: III  Anesthesia Plan: Spinal   Post-op Pain Management:    Induction: Intravenous  PONV Risk Score and Plan: 2 and Propofol infusion and Ondansetron  Airway Management Planned: Simple Face Mask  Additional Equipment:   Intra-op Plan:   Post-operative Plan:   Informed Consent: I have reviewed the patients History and Physical, chart, labs and discussed the procedure including the risks, benefits and alternatives for the proposed anesthesia with the patient or authorized representative who has indicated his/her understanding and acceptance.     Dental advisory given  Plan Discussed with: CRNA  Anesthesia Plan Comments:         Anesthesia Quick Evaluation

## 2019-12-18 NOTE — Op Note (Signed)
RIGHT TOTAL HIP ARTHROPLASTY ANTERIOR APPROACH  Procedure Note Curtis Hunt   [id number]  Pre-op Diagnosis: Right Hip Degenerative Joint Disease     Post-op Diagnosis: same   Operative Procedures  1. Total hip replacement; Right hip; uncemented [id number]   Personnel  Surgeon(s): Leandrew Koyanagi, MD  Assist: None   Anesthesia: spinal, local  Prosthesis: Depuy Acetabulum: Pinnacle 60 mm Femur: Actis 3 HO Head: 36 mm size: +8.5 Liner: lateralized Bearing Type: ceramic on poly  Total Hip Arthroplasty (Anterior Approach) Op Note:  After informed consent was obtained and the operative extremity marked in the holding area, the patient was brought back to the operating room and placed supine on the HANA table. Next, the operative extremity was prepped and draped in normal sterile fashion. Surgical timeout occurred verifying patient identification, surgical site, surgical procedure and administration of antibiotics.  A modified anterior Smith-Peterson approach to the hip was performed, using the interval between tensor fascia lata and sartorius.  Dissection was carried bluntly down onto the anterior hip capsule. The lateral femoral circumflex vessels were identified and coagulated. A capsulotomy was performed and the capsular flaps tagged for later repair.  The neck osteotomy was performed. The femoral head was removed, the acetabular rim was cleared of soft tissue and attention was turned to reaming the acetabulum.  Sequential reaming was performed under fluoroscopic guidance. We reamed to a size 59 mm, and then impacted the acetabular shell. A 25 mm cancellous screw was placed through the shell for added fixation.  The liner was then placed after irrigation and attention turned to the femur.  After placing the femoral hook, the leg was taken to externally rotated, extended and adducted position taking care to perform soft tissue releases to allow for adequate mobilization of the femur. Soft  tissue was cleared from the shoulder of the greater trochanter and the hook elevator used to improve exposure of the proximal femur. Sequential broaching performed up to a size 3.  He had dense sclerotic cancellous bone.  The trail broach had excellent fit and fill of the canal. Trial neck and head were placed. The leg was brought back up to neutral and the construct reduced.  Antibiotic irrigation was placed in the surgical wound and kept for at least 1 minute.  The position and sizing of components, offset and leg lengths were checked using fluoroscopy. Stability of the construct was checked in extension and external rotation without any subluxation or impingement of prosthesis. We dislocated the prosthesis, dropped the leg back into position, removed trial components, and irrigated copiously. The final stem and head was then placed, the leg brought back up, the system reduced and fluoroscopy used to verify positioning.  We irrigated, obtained hemostasis and closed the capsule using #2 ethibond suture.  One gram of vancomycin powder was placed in the surgical bed. A dilute solution of 20 cc of normal saline, 1.3% exparel, 0.25% bupivacaine was injected in the soft tissues.  One gram of topical tranexamic acid was injected into the joint.  The fascia was closed with #1 vicryl plus, the deep fat layer was closed with 0 vicryl, the subcutaneous layers closed with 2.0 Vicryl Plus and the skin closed with 2.0 nylon and dermabond. A sterile dressing was applied. The patient was awakened in the operating room and taken to recovery in stable condition.  All sponge, needle, and instrument counts were correct at the end of the case.   Position: supine  Complications: see description of procedure.  Time Out: performed   Drains/Packing: none  Estimated blood loss: see anesthesia record  Returned to Recovery Room: in good condition.   Antibiotics: yes   Mechanical VTE (DVT) Prophylaxis: sequential compression  devices, TED thigh-high  Chemical VTE (DVT) Prophylaxis: aspirin   Fluid Replacement: see anesthesia record  Specimens Removed: 1 to pathology   Sponge and Instrument Count Correct? yes   PACU: portable radiograph - low AP   Plan/RTC: Return in 2 weeks for staple removal. Weight Bearing/Load Lower Extremity: full  Hip precautions: none Suture Removal: 2 weeks   N. Eduard Roux, MD Marga Hoots 2:53 PM   Implant Name Type Inv. Item Serial No. Manufacturer Lot No. LRB No. Used Action  LINER NEUTRAL [id number] PLUS4 - [id number]  LINER NEUTRAL 58X36MM PLUS4  DEPUY SYNTHES [id number] Right 1 Implanted  PINNSECTOR W/GRIP ACE CUP 60MM - [id number] Hips PINNSECTOR W/GRIP ACE CUP 60MM  DEPUY SYNTHES [id number] Right 1 Implanted  SCREW 6.5MMX25MM - [id number] Screw SCREW 6.5MMX25MM  DEPUY SYNTHES [id number] Right 1 Implanted  STEM FEM ACTIS HIGH SZ3 - [id number] Stem STEM FEM ACTIS HIGH SZ3  DEPUY SYNTHES [id number] Right 1 Implanted  HEAD CERAMIC 36 PLUS 8.5 12 14  - [id number] Hips HEAD CERAMIC 36 PLUS 8.5 12 14   DEPUY SYNTHES [id number] Right 1 Implanted

## 2019-12-18 NOTE — Care Plan (Signed)
Ortho Bundle Case Management Note  Patient Details  Name: Curtis Hunt MRN: [id number] Date of Birth: 1950/10/01  [place] spoke with patient prior to surgery to discuss his upcoming Right total hip arthroplasty with Dr. Erlinda Hong on Monday, 12/18/19. Patient is an Ortho bundle patient through [facility] and is agreeable to case management. Reviewed all pre- and post-op instructions related to care. Patient has family and friends that will be able to assist after surgery. He will need a FWW and 3in/BSC. Referral made to [place] for delivery prior to discharge. Anticipate HHPT will be needed after a short hospital stay. Choice provided and referral to [facility] made. Will continue to follow for CM needs.                 DME Arranged:  3-N-1, [place] rolling DME Agency:  [employer]  HH Arranged:  PT [place] Agency:  [facility] (now [facility])  Additional Comments: Please contact me with any questions of if this plan should need to change.  Jamse Arn, RN, BSN, [employer]  [phone] 12/18/2019, 2:20 PM

## 2019-12-18 NOTE — H&P (Signed)
PREOPERATIVE H&P  Chief Complaint: Right Hip Degenerative Joint Disease  HPI: Curtis Hunt is a 69 y.o. male who presents for surgical treatment of Right Hip Degenerative Joint Disease.  He denies any changes in medical history.  Past Medical History:  Diagnosis Date  . Arthritis   . Cancer [facility]) 2004   Prostate CA with surgery  . Diabetes mellitus without complication ([place])   . Gout   . Hyperlipidemia   . Prostate cancer [facility])    Past Surgical History:  Procedure Laterality Date  . PROSTATE SURGERY     Social History   Socioeconomic History  . Marital status: Widowed    Spouse name: Not on file  . Number of children: Not on file  . Years of education: Not on file  . Highest education level: Not on file  Occupational History  . Not on file  Tobacco Use  . Smoking status: Never Smoker  . Smokeless tobacco: Never Used  Substance and Sexual Activity  . Alcohol use: Yes    Comment: Beer prior to diagnosis of diabetes, about once a week during sports on tv  . Drug use: No  . Sexual activity: Not Currently  Other Topics Concern  . Not on file  Social History Narrative  . Not on file   Social Determinants of Health   Financial Resource Strain: Low Risk   . Difficulty of Paying Living Expenses: Not hard at all  Food Insecurity:   . Worried About Charity fundraiser in the Last Year:   . Arboriculturist in the Last Year:   Transportation Needs:   . Film/video editor (Medical):   Marland Kitchen Lack of Transportation (Non-Medical):   Physical Activity:   . Days of Exercise per Week:   . Minutes of Exercise per Session:   Stress:   . Feeling of Stress :   Social Connections:   . Frequency of Communication with Friends and Family:   . Frequency of Social Gatherings with Friends and Family:   . Attends Religious Services:   . Active Member of Clubs or Organizations:   . Attends Archivist Meetings:   Marland Kitchen Marital Status:    Family History  Problem  Relation Age of Onset  . Cancer Brother        prostate  . Cancer Mother        breast ca   No Known Allergies Prior to Admission medications   Medication Sig Start Date End Date Taking? Authorizing Provider  aspirin 81 MG tablet Take 81 mg by mouth daily.   Yes [provider]  insulin detemir (LEVEMIR FLEXTOUCH) 100 UNIT/ML FlexPen Inject 25 Units into the skin daily at 10 pm. Patient taking differently: Inject 20 Units into the skin daily at 10 pm.  10/02/19  Yes Susy Frizzle, MD  rosuvastatin (CRESTOR) 40 MG tablet TAKE 1 TABLET BY MOUTH EVERY DAY Patient taking differently: Take 40 mg by mouth daily.  10/09/19  Yes Susy Frizzle, MD  TRULICITY 1.5 TM/5.4YT SOPN INJECT 1.5 MG INTO THE SKIN ONCE A WEEK Patient taking differently: Inject 1.5 mg as directed every Tuesday.  11/09/19  Yes Susy Frizzle, MD  allopurinol (ZYLOPRIM) 100 MG tablet Take 3 tablets (300 mg total) by mouth daily. Patient not taking: Reported on 12/04/2019 08/05/18   Susy Frizzle, MD  B-D UF III MINI PEN NEEDLES 31G X 5 MM MISC USE AS DIRECTED TWICE A DAY 09/28/19  Susy Frizzle, MD  Blood Glucose Monitoring Suppl (ONE TOUCH ULTRA 2) w/Device KIT AS DRIECTED 02/20/19   Susy Frizzle, MD  glucose blood (ONE TOUCH ULTRA TEST) test strip CHECK SUGAR 4-5 TIMES A DAY.EVERY MORNING AFTER MEALS AND AT BEDTIME 11/07/14   Susy Frizzle, MD  Insulin Pen Needle 31G X 6 MM MISC Use with Levemir Flextouch Pen 06/15/13   Samella Parr, NP  meloxicam (MOBIC) 7.5 MG tablet Take 1 tablet (7.5 mg total) by mouth 2 (two) times daily as needed for pain. Patient not taking: Reported on 12/04/2019 09/08/19   Leandrew Koyanagi, MD  [facility] ULTRA test strip CHECK SUGAR 4 TO 5 TIMES DAILY EVERY MORNING, AFTER MEALS, AND AT BEDTIME E11.65 12/04/19   Susy Frizzle, MD  VIAGRA 100 MG tablet TAKE 0.5-1 TABLETS (50-100 MG TOTAL) BY MOUTH DAILY AS NEEDED FOR ERECTILE DYSFUNCTION. Patient not taking: Reported on  12/04/2019 08/14/16   Susy Frizzle, MD     Positive ROS: All other systems have been reviewed and were otherwise negative with the exception of those mentioned in the HPI and as above.  Physical Exam: General: Alert, no acute distress Cardiovascular: No pedal edema Respiratory: No cyanosis, no use of accessory musculature GI: abdomen soft Skin: No lesions in the area of chief complaint Neurologic: Sensation intact distally Psychiatric: Patient is competent for consent with normal mood and affect Lymphatic: no lymphedema  MUSCULOSKELETAL: exam stable  Assessment: Right Hip Degenerative Joint Disease  Plan: Plan for Procedure(s): RIGHT TOTAL HIP ARTHROPLASTY ANTERIOR APPROACH  The risks benefits and alternatives were discussed with the patient including but not limited to the risks of nonoperative treatment, versus surgical intervention including infection, bleeding, nerve injury,  blood clots, cardiopulmonary complications, morbidity, mortality, among others, and they were willing to proceed.   Preoperative templating of the joint replacement has been completed, documented, and submitted to the Operating Room personnel in order to optimize intra-operative equipment management.   Eduard Roux, MD 12/18/2019 6:19 AM

## 2019-12-18 NOTE — Progress Notes (Signed)
Called patient tonight.  Scheduled to have PT tonight.  Pain is well controlled.  Anticipate going home tomorrow afternoon after PT.    Curtis Cecil, MD [facility] [phone] 8:26 PM

## 2019-12-18 NOTE — Transfer of Care (Signed)
Immediate Anesthesia Transfer of Care Note  Patient: Curtis Hunt  Procedure(s) Performed: RIGHT TOTAL HIP ARTHROPLASTY ANTERIOR APPROACH (Right Hip)  Patient Location: PACU  Anesthesia Type:MAC and Spinal  Level of Consciousness: awake, alert  and oriented  Airway & Oxygen Therapy: Patient Spontanous Breathing  Post-op Assessment: Report given to RN and Post -op Vital signs reviewed and stable  Post vital signs: Reviewed and stable  Last Vitals:  Vitals Value Taken Time  BP 120/78 12/18/19 1519  Temp    Pulse 77 12/18/19 1522  Resp 13 12/18/19 1522  SpO2 94 % 12/18/19 1522  Vitals shown include unvalidated device data.  Last Pain:  Vitals:   12/18/19 1046  TempSrc:   PainSc: 0-No pain      Patients Stated Pain Goal: 3 (81/44/81 8563)  Complications: No complications documented.

## 2019-12-18 NOTE — Anesthesia Postprocedure Evaluation (Signed)
Anesthesia Post Note  Patient: LEONARDO MAKRIS  Procedure(s) Performed: RIGHT TOTAL HIP ARTHROPLASTY ANTERIOR APPROACH (Right Hip)     Patient location during evaluation: PACU Anesthesia Type: Spinal Level of consciousness: oriented and awake and alert Pain management: pain level controlled Vital Signs Assessment: post-procedure vital signs reviewed and stable Respiratory status: spontaneous breathing and respiratory function stable Cardiovascular status: blood pressure returned to baseline and stable Postop Assessment: no headache, no backache, no apparent nausea or vomiting, spinal receding and patient able to bend at knees Anesthetic complications: no   No complications documented.  Last Vitals:  Vitals:   12/18/19 1549 12/18/19 1604  BP: (!) 105/62 (!) 102/63  Pulse: 62   Resp: 15 18  Temp:    SpO2: 93% 97%    Last Pain:  Vitals:   12/18/19 1604  TempSrc:   PainSc: (P) 0-No pain                 Kyah Buesing,W. EDMOND

## 2019-12-18 NOTE — Anesthesia Procedure Notes (Signed)
Spinal  Patient location during procedure: OR Start time: 12/18/2019 12:48 PM End time: 12/18/2019 12:54 PM Staffing Performed: anesthesiologist  Anesthesiologist: Roderic Palau, MD Preanesthetic Checklist Completed: patient identified, IV checked, risks and benefits discussed, surgical consent, monitors and equipment checked, pre-op evaluation and timeout performed Spinal Block Patient position: sitting Prep: DuraPrep Patient monitoring: cardiac monitor, continuous pulse ox and blood pressure Approach: right paramedian (Attempted midline. Unable to locate space.) Location: L3-4 Injection technique: single-shot Needle Needle type: Quincke (Attempted 24G Pencan x 2 levels.)  Needle gauge: 22 G Needle length: 9 cm Assessment Sensory level: T8 Additional Notes Functioning IV was confirmed and monitors were applied. Sterile prep and drape, including hand hygiene and sterile gloves were used. The patient was positioned and the spine was prepped. The skin was anesthetized with lidocaine.  Free flow of clear CSF was obtained prior to injecting local anesthetic into the CSF.  The spinal needle aspirated freely following injection.  The needle was carefully withdrawn.  The patient tolerated the procedure well.

## 2019-12-19 ENCOUNTER — Encounter (HOSPITAL_COMMUNITY): Payer: Self-pay | Admitting: Orthopaedic Surgery

## 2019-12-19 DIAGNOSIS — Z96641 Presence of right artificial hip joint: Secondary | ICD-10-CM | POA: Diagnosis not present

## 2019-12-19 DIAGNOSIS — E119 Type 2 diabetes mellitus without complications: Secondary | ICD-10-CM | POA: Diagnosis not present

## 2019-12-19 DIAGNOSIS — Z794 Long term (current) use of insulin: Secondary | ICD-10-CM | POA: Diagnosis not present

## 2019-12-19 DIAGNOSIS — Z7982 Long term (current) use of aspirin: Secondary | ICD-10-CM | POA: Diagnosis not present

## 2019-12-19 DIAGNOSIS — M1611 Unilateral primary osteoarthritis, right hip: Secondary | ICD-10-CM | POA: Diagnosis not present

## 2019-12-19 LAB — CBC
HCT: 34.6 % — ABNORMAL LOW (ref 39.0–52.0)
Hemoglobin: 10.7 g/dL — ABNORMAL LOW (ref 13.0–17.0)
MCH: 27.9 pg (ref 26.0–34.0)
MCHC: 30.9 g/dL (ref 30.0–36.0)
MCV: 90.3 fL (ref 80.0–100.0)
Platelets: 213 10*3/uL (ref 150–400)
RBC: 3.83 MIL/uL — ABNORMAL LOW (ref 4.22–5.81)
RDW: 14.2 % (ref 11.5–15.5)
WBC: 6.8 10*3/uL (ref 4.0–10.5)
nRBC: 0 % (ref 0.0–0.2)

## 2019-12-19 LAB — BASIC METABOLIC PANEL
Anion gap: 6 (ref 5–15)
BUN: 18 mg/dL (ref 8–23)
CO2: 28 mmol/L (ref 22–32)
Calcium: 8.4 mg/dL — ABNORMAL LOW (ref 8.9–10.3)
Chloride: 102 mmol/L (ref 98–111)
Creatinine, Ser: 1.36 mg/dL — ABNORMAL HIGH (ref 0.61–1.24)
GFR calc Af Amer: >60 mL/min (ref >60)
GFR calc non Af Amer: 53 mL/min — ABNORMAL LOW (ref >60)
Glucose, Bld: 115 mg/dL — ABNORMAL HIGH (ref 70–99)
Potassium: 4 mmol/L (ref 3.5–5.1)
Sodium: 136 mmol/L (ref 135–145)

## 2019-12-19 LAB — GLUCOSE, CAPILLARY
Glucose-Capillary: 144 mg/dL — ABNORMAL HIGH (ref 70–99)
Glucose-Capillary: 90 mg/dL (ref 70–99)

## 2019-12-19 NOTE — Plan of Care (Signed)
Pt doing well. Pt and wife given D/C instructions with verbal understanding. Rx's were sent to the pharmacy by MD. Pt's incision is clean and dry with no sign of infection. Pt's IV was removed prior to D/C. Pt's Home Health was arranged and RW and 3-n-1 were given to the Pt prior to D/C. Pt D/C'd home via wheelchair per MD order. Pt is stable @ D/C and has no other needs at this time. Holli Humbles, RN

## 2019-12-19 NOTE — Care Management (Addendum)
Ortho bundle patient Yes   DME Equipment 3-in-1 Bedside Commode   DME Equipment Walker   DME Status Ordered/company contacted   Intended DME Delivery Date 12/19/2019   [place]) (Primary Contact: Fritzi Mandes [phone])    Patient is listed as an ortho bundle. DME has been ordered by office, [place] pre arranged through [facility]  Notified [place]

## 2019-12-19 NOTE — Progress Notes (Signed)
   Subjective:  Patient reports pain as mild.  No events. Ready to go home.  Objective:   VITALS:   Vitals:   12/18/19 1921 12/18/19 2323 12/19/19 0435 12/19/19 0754  BP: (!) 131/73 116/69 101/76 (!) 104/64  Pulse: 95 84 74 79  Resp: 20 18 20 18   Temp: 98.8 F (37.1 C) 98.5 F (36.9 C) 97.7 F (36.5 C) 98.1 F (36.7 C)  TempSrc: Oral Oral Oral Oral  SpO2: 95% 97% 99% 98%  Weight:      Height:        Neurovascular intact Sensation intact distally Intact pulses distally Dorsiflexion/Plantar flexion intact Incision: dressing C/D/I and no drainage   Lab Results  Component Value Date   WBC 6.8 12/19/2019   HGB 10.7 (L) 12/19/2019   HCT 34.6 (L) 12/19/2019   MCV 90.3 12/19/2019   PLT 213 12/19/2019     Assessment/Plan:  1 Day Post-Op   - Expected postop acute blood loss anemia - will monitor for symptoms - Up with PT/OT - DVT ppx - SCDs, ambulation, aspirin - WBAT operative extremity - Pain control - Discharge planning - home today  Eduard Roux 12/19/2019, 12:28 PM [phone]

## 2019-12-19 NOTE — Evaluation (Signed)
Physical Therapy Evaluation Patient Details Name: Curtis Hunt MRN: [id number] DOB: January 26, 1951 Today's Date: 12/19/2019   History of Present Illness  Patient is a 69 y/o male who presents s/p right THA, dorect anterior approach, 12/18/19. PMH includes prostate ca, HLD, DM.  Clinical Impression  Patient presents with pain and post surgical deficits s/p above surgery. Pt lives alone and independent with ADLs (except difficulty with shoes/socks) and ambulation PTA. Today, pt tolerated bed mobility, transfers and gait training with use of RW for support. Tolerated stair training with supervision for safety and cues for technique. Pt plans to have g/f assist him at d/c. Provided handout and reviewed/instructed pt in exercises. Education re: precautions, Moving really well and eager to return home. Pt does not require PM session as pt functioning at supervision level and will have support at home. All education completed. Discharge from therapy.    Follow Up Recommendations Follow surgeon's recommendation for DC plan and follow-up therapies;Supervision - Intermittent    Equipment Recommendations  Other (comment) (already ordered)    Recommendations for Other Services       Precautions / Restrictions Precautions Precaution Comments: direct anterior approach Restrictions Weight Bearing Restrictions: Yes RLE Weight Bearing: Weight bearing as tolerated      Mobility  Bed Mobility Overal bed mobility: Needs Assistance Bed Mobility: Supine to Sit;Sit to Supine     Supine to sit: Supervision;HOB elevated Sit to supine: Modified independent (Device/Increase time);HOB elevated   General bed mobility comments: Increased time and effort to bring RLE to EOB, HOB slightly elevated. Able to lift BLEs into bed to return to supine.  Transfers Overall transfer level: Modified independent Equipment used: Rolling walker (2 wheeled)             General transfer comment: Stood from EOB without  difficulty x3, reaching for RW for support.  Ambulation/Gait Ambulation/Gait assistance: Supervision Gait Distance (Feet): 350 Feet (+ 50') Assistive device: Rolling walker (2 wheeled) Gait Pattern/deviations: Step-through pattern;Decreased stride length;Antalgic;Trunk flexed   Gait velocity interpretation: 1.31 - 2.62 ft/sec, indicative of limited community ambulator General Gait Details: Steady antalgic like gait with good speed. Cues for upright posture.  Stairs Stairs: Yes Stairs assistance: Supervision Stair Management: Two rails;Step to pattern Number of Stairs: 3 General stair comments: Cues for technique/safety.  Wheelchair Mobility    Modified Rankin (Stroke Patients Only)       Balance Overall balance assessment: Needs assistance Sitting-balance support: Feet supported;No upper extremity supported Sitting balance-Leahy Scale: Normal     Standing balance support: During functional activity Standing balance-Leahy Scale: Fair Standing balance comment: Able to stand statically without UE support, does better with UE support for walking/dynamic tasks.                             Pertinent Vitals/Pain Pain Assessment: Faces Faces Pain Scale: Hurts a little bit Pain Location: right hip Pain Descriptors / Indicators: Operative site guarding;Sore Pain Intervention(s): Monitored during session;Repositioned    Home Living Family/patient expects to be discharged to:: Private residence Living Arrangements: Alone;Non-relatives/Friends (g/f will be assisting at [employer]) Available Help at Discharge: Friend(s);Available PRN/intermittently Type of Home: House Home Access: Stairs to enter Entrance Stairs-Rails: Right Entrance Stairs-Number of Steps: 2 Home Layout: One level Home Equipment: None Additional Comments: DME already ordered per ortho bundle    Prior Function Level of Independence: Independent         Comments: Does own ADLs/IADLs except difficulty  with  shoes/socks. 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 Drives. Reports difficulty walking.     Hand Dominance        Extremity/Trunk Assessment   Upper Extremity Assessment Upper Extremity Assessment: Defer to OT evaluation    Lower Extremity Assessment Lower Extremity Assessment: RLE deficits/detail RLE Deficits / Details: post surgical deficits in AROM especially at hip RLE Sensation: WNL RLE Coordination: WNL       Communication   Communication: No difficulties  Cognition Arousal/Alertness: Awake/alert Behavior During Therapy: WFL for tasks assessed/performed Overall Cognitive Status: Within Functional Limits for tasks assessed                                        General Comments General comments (skin integrity, edema, etc.): Provided HEP handout and reviewed all exercises for home.    Exercises Total Joint Exercises Quad Sets: AROM;Right;Supine;10 reps Heel Slides: AROM;Right;5 reps;Supine Hip ABduction/ADduction: AROM;Right;5 reps;Supine Long Arc Quad: AROM;Right;10 reps;Seated   Assessment/Plan    PT Assessment Patent does not need any further PT services  PT Problem List         PT Treatment Interventions      PT Goals (Current goals can be found in the Care Plan section)  Acute Rehab PT Goals Patient Stated Goal: to go home today PT Goal Formulation: All assessment and education complete, DC therapy    Frequency     Barriers to discharge        Co-evaluation               AM-PAC PT "6 Clicks" Mobility  Outcome Measure Help needed turning from your back to your side while in a flat bed without using bedrails?: None Help needed moving from lying on your back to sitting on the side of a flat bed without using bedrails?: A Little Help needed moving to and from a bed to a chair (including a wheelchair)?: None Help needed standing up from a chair using your arms (e.g., wheelchair or bedside  chair)?: None Help needed to walk in hospital room?: A Little Help needed climbing 3-5 steps with a railing? : A Little 6 Click Score: 21    End of Session Equipment Utilized During Treatment: Gait belt Activity Tolerance: Patient tolerated treatment well Patient left: in bed;with call bell/phone within reach Nurse Communication: Mobility status PT Visit Diagnosis: Pain;Difficulty in walking, not elsewhere classified (R26.2) Pain - Right/Left: Right Pain - part of body: Hip    Time: 5329-9242 PT Time Calculation (min) (ACUTE ONLY): 27 min   Charges:   PT Evaluation $PT Eval Moderate Complexity: 1 Mod PT Treatments $Gait Training: 8-22 mins        Marisa Severin, PT, DPT Acute Rehabilitation Services Pager [phone] Office [phone]      Marguarite Arbour A Sabra Heck 12/19/2019, 9:33 AM

## 2019-12-19 NOTE — Discharge Summary (Signed)
Patient ID: Curtis Hunt MRN: [id number] DOB/AGE: [birth year] 69 y.o.  Admit date: 12/18/2019 Discharge date: 12/19/2019  Admission Diagnoses:  Primary osteoarthritis of right hip  Discharge Diagnoses:  Principal Problem:   Primary osteoarthritis of right hip Active Problems:   Status post total replacement of right hip   Past Medical History:  Diagnosis Date   Arthritis    Cancer ([place]) 2004   Prostate CA with surgery   Diabetes mellitus without complication ([place])    Gout    Hyperlipidemia    Prostate cancer ([place])     Surgeries: Procedure(s): RIGHT TOTAL HIP ARTHROPLASTY ANTERIOR APPROACH on 12/18/2019   Consultants (if any):   Discharged Condition: Improved  Hospital Course: Curtis Hunt is an 69 y.o. male who was admitted 12/18/2019 with a diagnosis of Primary osteoarthritis of right hip and went to the operating room on 12/18/2019 and underwent the above named procedures.    He was given perioperative antibiotics:  Anti-infectives (From admission, onward)   Start     Dose/Rate Route Frequency Ordered Stop   12/18/19 1800  ceFAZolin (ANCEF) IVPB 2g/100 mL premix        2 g 200 mL/hr over 30 Minutes Intravenous Every 6 hours 12/18/19 1745 12/19/19 0538   12/18/19 1232  vancomycin (VANCOCIN) powder  Status:  Discontinued          As needed 12/18/19 1232 12/18/19 1511   12/18/19 0600  ceFAZolin (ANCEF) 3 g in dextrose 5 % 50 mL IVPB        3 g 100 mL/hr over 30 Minutes Intravenous On call to O.R. 12/15/19 0657 12/18/19 1820   12/18/19 0000  sulfamethoxazole-trimethoprim (BACTRIM DS) 800-160 MG tablet     Discontinue     1 tablet Oral 2 times daily 12/18/19 1148      .  He was given sequential compression devices, early ambulation, and appropriate chemoprophylaxis for DVT prophylaxis.  He benefited maximally from the hospital stay and there were no complications.    Recent vital signs:  Vitals:   12/19/19 0435 12/19/19 0754  BP: 101/76 (!) 104/64    Pulse: 74 79  Resp: 20 18  Temp: 97.7 F (36.5 C) 98.1 F (36.7 C)  SpO2: 99% 98%    Recent laboratory studies:  Lab Results  Component Value Date   HGB 10.7 (L) 12/19/2019   HGB 13.0 12/14/2019   HGB 12.2 (L) 10/24/2019   Lab Results  Component Value Date   WBC 6.8 12/19/2019   PLT 213 12/19/2019   Lab Results  Component Value Date   INR 0.9 12/14/2019   Lab Results  Component Value Date   NA 136 12/19/2019   K 4.0 12/19/2019   CL 102 12/19/2019   CO2 28 12/19/2019   BUN 18 12/19/2019   CREATININE 1.36 (H) 12/19/2019   GLUCOSE 115 (H) 12/19/2019    Discharge Medications:   Allergies as of 12/19/2019   No Known Allergies     Medication List    STOP taking these medications   aspirin 81 MG tablet Replaced by: aspirin EC 81 MG tablet     TAKE these medications   allopurinol 100 MG tablet Commonly known as: ZYLOPRIM Take 3 tablets (300 mg total) by mouth daily.   aspirin EC 81 MG tablet Take 1 tablet (81 mg total) by mouth 2 (two) times daily. Replaces: aspirin 81 MG tablet   glucose blood test strip Commonly known as: ONE TOUCH ULTRA TEST CHECK  SUGAR 4-5 TIMES A DAY.EVERY MORNING AFTER MEALS AND AT BEDTIME   OneTouch Ultra test strip Generic drug: glucose blood CHECK SUGAR 4 TO 5 TIMES DAILY EVERY MORNING, AFTER MEALS, AND AT BEDTIME E11.65   Insulin Pen Needle 31G X 6 MM Misc Use with Levemir Flextouch Pen   B-D UF III MINI PEN NEEDLES 31G X 5 MM Misc Generic drug: Insulin Pen Needle USE AS DIRECTED TWICE A DAY   Levemir FlexTouch 100 UNIT/ML FlexPen Generic drug: insulin detemir Inject 25 Units into the skin daily at 10 pm. What changed: how much to take   meloxicam 7.5 MG tablet Commonly known as: Mobic Take 1 tablet (7.5 mg total) by mouth 2 (two) times daily as needed for pain.   methocarbamol 750 MG tablet Commonly known as: ROBAXIN Take 1 tablet (750 mg total) by mouth 2 (two) times daily as needed for muscle spasms.    ondansetron 4 MG tablet Commonly known as: ZOFRAN Take 1-2 tablets (4-8 mg total) by mouth every 8 (eight) hours as needed for nausea or vomiting.   ONE TOUCH ULTRA 2 w/Device Kit AS DRIECTED   oxyCODONE-acetaminophen 5-325 MG tablet Commonly known as: Percocet Take 1-2 tablets by mouth every 8 (eight) hours as needed for severe pain.   rosuvastatin 40 MG tablet Commonly known as: CRESTOR TAKE 1 TABLET BY MOUTH EVERY DAY   sulfamethoxazole-trimethoprim 800-160 MG tablet Commonly known as: BACTRIM DS Take 1 tablet by mouth 2 (two) times daily.   Trulicity 1.5 NO/6.7EH Sopn Generic drug: Dulaglutide INJECT 1.5 MG INTO THE SKIN ONCE A WEEK What changed: See the new instructions.   Viagra 100 MG tablet Generic drug: sildenafil TAKE 0.5-1 TABLETS (50-100 MG TOTAL) BY MOUTH DAILY AS NEEDED FOR ERECTILE DYSFUNCTION.            Durable Medical Equipment  (From admission, onward)         Start     Ordered   12/18/19 1746  DME Walker rolling  Once       Question:  Patient needs a walker to treat with the following condition  Answer:  History of hip replacement   12/18/19 1745   12/18/19 1746  DME 3 n 1  Once        12/18/19 1745   12/18/19 1746  DME Bedside commode  Once       Question:  Patient needs a bedside commode to treat with the following condition  Answer:  History of hip replacement   12/18/19 1745          Diagnostic Studies: DG Chest 2 View  Result Date: 12/15/2019 CLINICAL DATA:  Pre right hip replacement evaluation. EXAM: CHEST - 2 VIEW COMPARISON:  06/22/2003 FINDINGS: Normal sized heart. Tortuous aorta. Clear lungs. Thoracic spine degenerative changes. IMPRESSION: No acute abnormality. Electronically Signed   By: Claudie Revering M.D.   On: 12/15/2019 15:58   DG Pelvis Portable  Addendum Date: 12/19/2019   ADDENDUM REPORT: 12/19/2019 09:37 ADDENDUM: The report for the intraoperative radiograph was recorded for the postoperative radiograph. The postop  radiograph demonstrates right total hip arthroplasty. No fractures are present. Femoral and acetabular components appear well seated on this one view. Advanced degenerative changes are present in the left hip. Impressions status post right total hip arthroplasty without radiographic evidence for complication. Electronically Signed   By: San Morelle M.D.   On: 12/19/2019 09:37   Result Date: 12/19/2019 CLINICAL DATA:  Right total hip arthroplasty.  Anterior approach.  EXAM: OPERATIVE right HIP (WITH PELVIS IF PERFORMED) 2 VIEWS TECHNIQUE: Fluoroscopic spot image(s) were submitted for interpretation post-operatively. COMPARISON:  Right hip radiographs 09/08/2019 FINDINGS: Intraoperative views demonstrate right total hip arthroplasty. Prosthesis appears located on this single view. Visualized femur and pelvis are within normal limits. IMPRESSION: Right total hip arthroplasty without radiographic evidence for complication. Electronically Signed: By: San Morelle M.D. On: 12/18/2019 16:25   DG C-Arm 1-60 Min  Addendum Date: 12/19/2019   ADDENDUM REPORT: 12/19/2019 09:37 ADDENDUM: The report for the intraoperative radiograph was recorded for the postoperative radiograph. The postop radiograph demonstrates right total hip arthroplasty. No fractures are present. Femoral and acetabular components appear well seated on this one view. Advanced degenerative changes are present in the left hip. Impressions status post right total hip arthroplasty without radiographic evidence for complication. Electronically Signed   By: San Morelle M.D.   On: 12/19/2019 09:37   Result Date: 12/19/2019 CLINICAL DATA:  Right total hip arthroplasty.  Anterior approach. EXAM: OPERATIVE right HIP (WITH PELVIS IF PERFORMED) 2 VIEWS TECHNIQUE: Fluoroscopic spot image(s) were submitted for interpretation post-operatively. COMPARISON:  Right hip radiographs 09/08/2019 FINDINGS: Intraoperative views demonstrate right  total hip arthroplasty. Prosthesis appears located on this single view. Visualized femur and pelvis are within normal limits. IMPRESSION: Right total hip arthroplasty without radiographic evidence for complication. Electronically Signed: By: San Morelle M.D. On: 12/18/2019 16:25   DG HIP OPERATIVE UNILAT W OR W/O PELVIS RIGHT  Addendum Date: 12/19/2019   ADDENDUM REPORT: 12/19/2019 09:37 ADDENDUM: The report for the intraoperative radiograph was recorded for the postoperative radiograph. The postop radiograph demonstrates right total hip arthroplasty. No fractures are present. Femoral and acetabular components appear well seated on this one view. Advanced degenerative changes are present in the left hip. Impressions status post right total hip arthroplasty without radiographic evidence for complication. Electronically Signed   By: San Morelle M.D.   On: 12/19/2019 09:37   Result Date: 12/19/2019 CLINICAL DATA:  Right total hip arthroplasty.  Anterior approach. EXAM: OPERATIVE right HIP (WITH PELVIS IF PERFORMED) 2 VIEWS TECHNIQUE: Fluoroscopic spot image(s) were submitted for interpretation post-operatively. COMPARISON:  Right hip radiographs 09/08/2019 FINDINGS: Intraoperative views demonstrate right total hip arthroplasty. Prosthesis appears located on this single view. Visualized femur and pelvis are within normal limits. IMPRESSION: Right total hip arthroplasty without radiographic evidence for complication. Electronically Signed: By: San Morelle M.D. On: 12/18/2019 16:25    Disposition: Discharge disposition: 01-Home or Self Care       Discharge Instructions    Call MD / Call 911   Complete by: As directed    If you experience chest pain or shortness of breath, CALL 911 and be transported to the hospital emergency room.  If you develope a fever above 101.5 F, pus (white drainage) or increased drainage or redness at the wound, or calf pain, call your surgeon's  office.   Constipation Prevention   Complete by: As directed    Drink plenty of fluids.  Prune juice may be helpful.  You may use a stool softener, such as Colace (over the counter) 100 mg twice a day.  Use MiraLax (over the counter) for constipation as needed.   Driving restrictions   Complete by: As directed    No driving while taking narcotic pain meds.   Increase activity slowly as tolerated   Complete by: As directed        Follow-up Information    Leandrew Koyanagi, MD. Go on 01/02/2020.  Specialty: Orthopedic Surgery Why: at 9:00 am for a 2 week follow up with Dr. Sherilyn Cooter information: [place] [phone]        Home, [facility] Follow up.   Specialty: Home Health Services Why: Someone from the home health agency will be in contact with you after discharge home to coordinate your first in home physical therapy appointment at your home. Contact information: [address] [place] [address] [phone]                Signed: Eduard Roux 12/19/2019, 12:29 PM

## 2019-12-20 ENCOUNTER — Telehealth: Payer: Self-pay | Admitting: *Deleted

## 2019-12-20 DIAGNOSIS — Z471 Aftercare following joint replacement surgery: Secondary | ICD-10-CM | POA: Diagnosis not present

## 2019-12-20 DIAGNOSIS — K219 Gastro-esophageal reflux disease without esophagitis: Secondary | ICD-10-CM | POA: Diagnosis not present

## 2019-12-20 DIAGNOSIS — Z794 Long term (current) use of insulin: Secondary | ICD-10-CM | POA: Diagnosis not present

## 2019-12-20 DIAGNOSIS — E785 Hyperlipidemia, unspecified: Secondary | ICD-10-CM | POA: Diagnosis not present

## 2019-12-20 DIAGNOSIS — E119 Type 2 diabetes mellitus without complications: Secondary | ICD-10-CM | POA: Diagnosis not present

## 2019-12-20 DIAGNOSIS — M109 Gout, unspecified: Secondary | ICD-10-CM | POA: Diagnosis not present

## 2019-12-20 DIAGNOSIS — Z96641 Presence of right artificial hip joint: Secondary | ICD-10-CM | POA: Diagnosis not present

## 2019-12-20 NOTE — Care Plan (Signed)
Ortho bundle D/C call to patient today. Patient is S/P Right THA on 12/18/19 with Dr. Erlinda Hong. Discharged on 12/19/19. He verbalized he is "doing great today". HHPT is in his home while RNCM called and performing first in home evaluation. Reminded to call with questions or needs.

## 2019-12-20 NOTE — Telephone Encounter (Signed)
Ortho bundle D/C call completed. 

## 2019-12-23 DIAGNOSIS — Z794 Long term (current) use of insulin: Secondary | ICD-10-CM | POA: Diagnosis not present

## 2019-12-23 DIAGNOSIS — E119 Type 2 diabetes mellitus without complications: Secondary | ICD-10-CM | POA: Diagnosis not present

## 2019-12-23 DIAGNOSIS — M109 Gout, unspecified: Secondary | ICD-10-CM | POA: Diagnosis not present

## 2019-12-23 DIAGNOSIS — Z96641 Presence of right artificial hip joint: Secondary | ICD-10-CM | POA: Diagnosis not present

## 2019-12-23 DIAGNOSIS — K219 Gastro-esophageal reflux disease without esophagitis: Secondary | ICD-10-CM | POA: Diagnosis not present

## 2019-12-23 DIAGNOSIS — E785 Hyperlipidemia, unspecified: Secondary | ICD-10-CM | POA: Diagnosis not present

## 2019-12-23 DIAGNOSIS — Z471 Aftercare following joint replacement surgery: Secondary | ICD-10-CM | POA: Diagnosis not present

## 2019-12-25 ENCOUNTER — Telehealth: Payer: Self-pay | Admitting: *Deleted

## 2019-12-25 ENCOUNTER — Other Ambulatory Visit: Payer: Self-pay | Admitting: Physician Assistant

## 2019-12-25 ENCOUNTER — Telehealth: Payer: Self-pay

## 2019-12-25 MED ORDER — CIPROFLOXACIN HCL 500 MG PO TABS
500.0000 mg | ORAL_TABLET | Freq: Two times a day (BID) | ORAL | 0 refills | Status: AC
Start: 2019-12-25 — End: 2020-01-01

## 2019-12-25 NOTE — Telephone Encounter (Signed)
Ortho bundle 7 day post-op call completed. See care plan note.

## 2019-12-25 NOTE — Progress Notes (Signed)
Can you let him know he has a UTI and that I have called in antibiotics that he needs to start

## 2019-12-25 NOTE — Telephone Encounter (Signed)
Per Tawanna Cooler, Trinidad Curet, PA-C  Precious Bard, RMA Can you let him know he has a UTI and that I have called in antibiotics that he needs to start "    I called x2 times, patient no answer. Could not LVM. Will try again later. Just need to advise message above.

## 2019-12-25 NOTE — Telephone Encounter (Signed)
Verified with Dr. Erlinda Hong that patient was started on Bactrim on 12/18/19 and should complete this for 10 days. Disregarded Cipro order done today. Patient aware.

## 2019-12-25 NOTE — Care Plan (Signed)
Call to patient for 7 day post-op status check. He states he is doing well and having no issues. Asked about Abx prescribed today for UTI from office. He noted that he was prescribed Bactrim on day of surgery that he started and is still taking currently. Asked if he should pick up Cipro and stop Bactrim or disregard. CM sent message to Tawanna Cooler, PA-C for Dr. Erlinda Hong. Will call patient back regarding how to proceed once confirmed from PA.

## 2019-12-27 DIAGNOSIS — K219 Gastro-esophageal reflux disease without esophagitis: Secondary | ICD-10-CM | POA: Diagnosis not present

## 2019-12-27 DIAGNOSIS — E785 Hyperlipidemia, unspecified: Secondary | ICD-10-CM | POA: Diagnosis not present

## 2019-12-27 DIAGNOSIS — Z471 Aftercare following joint replacement surgery: Secondary | ICD-10-CM | POA: Diagnosis not present

## 2019-12-27 DIAGNOSIS — M109 Gout, unspecified: Secondary | ICD-10-CM | POA: Diagnosis not present

## 2019-12-27 DIAGNOSIS — E119 Type 2 diabetes mellitus without complications: Secondary | ICD-10-CM | POA: Diagnosis not present

## 2019-12-27 DIAGNOSIS — Z794 Long term (current) use of insulin: Secondary | ICD-10-CM | POA: Diagnosis not present

## 2019-12-27 DIAGNOSIS — Z96641 Presence of right artificial hip joint: Secondary | ICD-10-CM | POA: Diagnosis not present

## 2019-12-29 DIAGNOSIS — Z471 Aftercare following joint replacement surgery: Secondary | ICD-10-CM | POA: Diagnosis not present

## 2019-12-29 DIAGNOSIS — E785 Hyperlipidemia, unspecified: Secondary | ICD-10-CM | POA: Diagnosis not present

## 2019-12-29 DIAGNOSIS — Z794 Long term (current) use of insulin: Secondary | ICD-10-CM | POA: Diagnosis not present

## 2019-12-29 DIAGNOSIS — K219 Gastro-esophageal reflux disease without esophagitis: Secondary | ICD-10-CM | POA: Diagnosis not present

## 2019-12-29 DIAGNOSIS — M109 Gout, unspecified: Secondary | ICD-10-CM | POA: Diagnosis not present

## 2019-12-29 DIAGNOSIS — E119 Type 2 diabetes mellitus without complications: Secondary | ICD-10-CM | POA: Diagnosis not present

## 2019-12-29 DIAGNOSIS — Z96641 Presence of right artificial hip joint: Secondary | ICD-10-CM | POA: Diagnosis not present

## 2020-01-01 DIAGNOSIS — Z471 Aftercare following joint replacement surgery: Secondary | ICD-10-CM | POA: Diagnosis not present

## 2020-01-01 DIAGNOSIS — K219 Gastro-esophageal reflux disease without esophagitis: Secondary | ICD-10-CM | POA: Diagnosis not present

## 2020-01-01 DIAGNOSIS — E785 Hyperlipidemia, unspecified: Secondary | ICD-10-CM | POA: Diagnosis not present

## 2020-01-01 DIAGNOSIS — Z96641 Presence of right artificial hip joint: Secondary | ICD-10-CM | POA: Diagnosis not present

## 2020-01-01 DIAGNOSIS — Z794 Long term (current) use of insulin: Secondary | ICD-10-CM | POA: Diagnosis not present

## 2020-01-01 DIAGNOSIS — M109 Gout, unspecified: Secondary | ICD-10-CM | POA: Diagnosis not present

## 2020-01-01 DIAGNOSIS — E119 Type 2 diabetes mellitus without complications: Secondary | ICD-10-CM | POA: Diagnosis not present

## 2020-01-02 ENCOUNTER — Telehealth: Payer: Self-pay | Admitting: *Deleted

## 2020-01-02 ENCOUNTER — Encounter: Payer: Self-pay | Admitting: Orthopaedic Surgery

## 2020-01-02 ENCOUNTER — Ambulatory Visit (INDEPENDENT_AMBULATORY_CARE_PROVIDER_SITE_OTHER): Payer: Medicare Other | Admitting: Physician Assistant

## 2020-01-02 DIAGNOSIS — Z96641 Presence of right artificial hip joint: Secondary | ICD-10-CM

## 2020-01-02 MED ORDER — HYDROCODONE-ACETAMINOPHEN 5-325 MG PO TABS: 1.0000 | ORAL_TABLET | Freq: Three times a day (TID) | ORAL | 0 refills | Status: DC | PRN

## 2020-01-02 NOTE — Telephone Encounter (Signed)
Ortho bundle 14 day call completed. 

## 2020-01-02 NOTE — Progress Notes (Signed)
   Post-Op Visit Note   Patient: Curtis Hunt           Date of Birth: [birth year]           MRN: [id number] Visit Date: 01/02/2020 PCP: Susy Frizzle, MD   Assessment & Plan:  Chief Complaint:  Chief Complaint  Patient presents with  . Right Hip - Pain   Visit Diagnoses:  1. History of total hip replacement, right     Plan: Patient is a pleasant 69 year old gentleman who comes in today 2 weeks out right anterior total hip replacement 12/18/2019.  He has been doing very well.  He has finished home health physical therapy and is ambulating with a cane.  Minimal pain which is relieved with narcotic pain medication.  No fevers or chills.  Examination of his right hip reveals well-healing surgical incision with nylon sutures in place.  No evidence of infection or cellulitis.  Calves are soft and nontender.  He is neurovascular intact distally.  At this point, he will continue working on a home exercise program.  We have reinforced dental prophylaxis.  Follow-up with [place] in 4 weeks time for repeat evaluation and AP pelvis lateral right hip x-rays.  Call with concerns or questions anytime.  Follow-Up Instructions: Return in about 4 weeks (around 01/30/2020).   Orders:  No orders of the defined types were placed in this encounter.  Meds ordered this encounter  Medications  . HYDROcodone-acetaminophen (NORCO) 5-325 MG tablet    Sig: Take 1-2 tablets by mouth 3 (three) times daily as needed.    Dispense:  30 tablet    Refill:  0    Imaging: No new imaging  PMFS History: Patient Active Problem List   Diagnosis Date Noted  . Status post total replacement of right hip 12/18/2019  . Primary osteoarthritis of right hip 09/08/2019  . Malignant neoplasm of prostate ([place]) 04/29/2015  . Rhabdomyolysis 06/09/2013  . Dehydration 06/09/2013  . Newly diagnosed diabetes ([place]) 06/09/2013  . Dyslipidemia 06/09/2013  . Metabolic encephalopathy 46/56/8127  . Hypernatremia 06/09/2013  . DKA  (diabetic ketoacidoses) ([place]) 06/08/2013  . Weight loss 06/08/2013  . Oral thrush 06/08/2013  . GERD (gastroesophageal reflux disease) 06/08/2013  . Jerking movements of extremities 06/08/2013  . Acute renal failure ([place]) 06/08/2013  . Gout    Past Medical History:  Diagnosis Date  . Arthritis   . Cancer [facility]) 2004   Prostate CA with surgery  . Diabetes mellitus without complication ([place])   . Gout   . Hyperlipidemia   . Prostate cancer [facility])     Family History  Problem Relation Age of Onset  . Cancer Brother        prostate  . Cancer Mother        breast ca    Past Surgical History:  Procedure Laterality Date  . PROSTATE SURGERY    . TOTAL HIP ARTHROPLASTY Right 12/18/2019   Procedure: RIGHT TOTAL HIP ARTHROPLASTY ANTERIOR APPROACH;  Surgeon: Leandrew Koyanagi, MD;  Location: [facility];  Service: Orthopedics;  Laterality: Right;   Social History   Occupational History  . Not on file  Tobacco Use  . Smoking status: Never Smoker  . Smokeless tobacco: Never Used  Substance and Sexual Activity  . Alcohol use: Yes    Comment: Beer prior to diagnosis of diabetes, about once a week during sports on tv  . Drug use: No  . Sexual activity: Not Currently

## 2020-01-19 ENCOUNTER — Telehealth: Payer: Self-pay | Admitting: *Deleted

## 2020-01-19 NOTE — Telephone Encounter (Signed)
30 day Ortho bundle call completed.

## 2020-01-20 ENCOUNTER — Other Ambulatory Visit: Payer: Self-pay | Admitting: Orthopaedic Surgery

## 2020-01-22 NOTE — Telephone Encounter (Signed)
Only needs 6 weeks post-op

## 2020-01-30 ENCOUNTER — Encounter: Payer: Self-pay | Admitting: Orthopaedic Surgery

## 2020-01-30 ENCOUNTER — Ambulatory Visit (INDEPENDENT_AMBULATORY_CARE_PROVIDER_SITE_OTHER): Payer: Medicare Other

## 2020-01-30 ENCOUNTER — Ambulatory Visit (INDEPENDENT_AMBULATORY_CARE_PROVIDER_SITE_OTHER): Payer: Medicare Other | Admitting: Orthopaedic Surgery

## 2020-01-30 VITALS — Ht 73.0 in | Wt 260.0 lb

## 2020-01-30 DIAGNOSIS — Z96641 Presence of right artificial hip joint: Secondary | ICD-10-CM

## 2020-01-30 NOTE — Progress Notes (Signed)
   Post-Op Visit Note   Patient: Curtis Hunt           Date of Birth: [birth year]           MRN: [id number] Visit Date: 01/30/2020 PCP: Susy Frizzle, MD   Assessment & Plan:  Chief Complaint:  Chief Complaint  Patient presents with  . Right Hip - Follow-up    Right total hip arthroplasty 12/18/2019   Visit Diagnoses:  1. History of total hip replacement, right     Plan: Patient is a very pleasant 69 year old gentleman who comes in today 6 weeks out right total hip replacement 12/18/2019.  He has been doing great.  Feels better than he has felt in the past 10 years.  He continues work on home exercises.  He has regained near full strength.  Examination of his right hip reveals a fully healed surgical scar without complication.  He has near full range of motion of his hip.  Calf is soft nontender.  He is neurovascular intact distally.  At this point, he will continue to advance with activity as tolerated.  Dental prophylaxis reinforced.  Follow-up with [place] in 6 weeks time for recheck.  Follow-Up Instructions: Return in about 6 weeks (around 03/12/2020).   Orders:  Orders Placed This Encounter  Procedures  . XR HIP UNILAT W OR W/O PELVIS 2-3 VIEWS RIGHT   No orders of the defined types were placed in this encounter.   Imaging: XR HIP UNILAT W OR W/O PELVIS 2-3 VIEWS RIGHT  Result Date: 01/30/2020 Well-seated prosthesis without complication   PMFS History: Patient Active Problem List   Diagnosis Date Noted  . Status post total replacement of right hip 12/18/2019  . Primary osteoarthritis of right hip 09/08/2019  . Malignant neoplasm of prostate ([place]) 04/29/2015  . Rhabdomyolysis 06/09/2013  . Dehydration 06/09/2013  . Newly diagnosed diabetes ([place]) 06/09/2013  . Dyslipidemia 06/09/2013  . Metabolic encephalopathy 02/54/2706  . Hypernatremia 06/09/2013  . DKA (diabetic ketoacidoses) ([place]) 06/08/2013  . Weight loss 06/08/2013  . Oral thrush 06/08/2013  . GERD  (gastroesophageal reflux disease) 06/08/2013  . Jerking movements of extremities 06/08/2013  . Acute renal failure ([place]) 06/08/2013  . Gout    Past Medical History:  Diagnosis Date  . Arthritis   . Cancer [facility]) 2004   Prostate CA with surgery  . Diabetes mellitus without complication ([place])   . Gout   . Hyperlipidemia   . Prostate cancer [facility])     Family History  Problem Relation Age of Onset  . Cancer Brother        prostate  . Cancer Mother        breast ca    Past Surgical History:  Procedure Laterality Date  . PROSTATE SURGERY    . TOTAL HIP ARTHROPLASTY Right 12/18/2019   Procedure: RIGHT TOTAL HIP ARTHROPLASTY ANTERIOR APPROACH;  Surgeon: Leandrew Koyanagi, MD;  Location: [facility];  Service: Orthopedics;  Laterality: Right;   Social History   Occupational History  . Not on file  Tobacco Use  . Smoking status: Never Smoker  . Smokeless tobacco: Never Used  Substance and Sexual Activity  . Alcohol use: Yes    Comment: Beer prior to diagnosis of diabetes, about once a week during sports on tv  . Drug use: No  . Sexual activity: Not Currently

## 2020-02-09 ENCOUNTER — Other Ambulatory Visit: Payer: Self-pay | Admitting: Physician Assistant

## 2020-02-22 ENCOUNTER — Other Ambulatory Visit: Payer: Medicare Other

## 2020-02-22 ENCOUNTER — Ambulatory Visit (INDEPENDENT_AMBULATORY_CARE_PROVIDER_SITE_OTHER): Payer: Medicare Other | Admitting: *Deleted

## 2020-02-22 ENCOUNTER — Other Ambulatory Visit: Payer: Self-pay

## 2020-02-22 DIAGNOSIS — E785 Hyperlipidemia, unspecified: Secondary | ICD-10-CM | POA: Diagnosis not present

## 2020-02-22 DIAGNOSIS — Z794 Long term (current) use of insulin: Secondary | ICD-10-CM | POA: Diagnosis not present

## 2020-02-22 DIAGNOSIS — Z23 Encounter for immunization: Secondary | ICD-10-CM

## 2020-02-22 DIAGNOSIS — N1831 Chronic kidney disease, stage 3a: Secondary | ICD-10-CM

## 2020-02-22 DIAGNOSIS — E119 Type 2 diabetes mellitus without complications: Secondary | ICD-10-CM | POA: Diagnosis not present

## 2020-02-23 LAB — MICROALBUMIN / CREATININE URINE RATIO
Creatinine, Urine: 152 mg/dL (ref 20–320)
Microalb Creat Ratio: 17 mcg/mg creat (ref <30)
Microalb, Ur: 2.6 mg/dL

## 2020-02-23 LAB — CBC WITH DIFFERENTIAL/PLATELET
Absolute Monocytes: 465 cells/uL (ref 200–950)
Basophils Absolute: 101 cells/uL (ref 0–200)
Basophils Relative: 1.8 %
Eosinophils Absolute: 218 cells/uL (ref 15–500)
Eosinophils Relative: 3.9 %
HCT: 37.1 % — ABNORMAL LOW (ref 38.5–50.0)
Hemoglobin: 12 g/dL — ABNORMAL LOW (ref 13.2–17.1)
Lymphs Abs: 2033 cells/uL (ref 850–3900)
MCH: 28.4 pg (ref 27.0–33.0)
MCHC: 32.3 g/dL (ref 32.0–36.0)
MCV: 87.9 fL (ref 80.0–100.0)
MPV: 10.3 fL (ref 7.5–12.5)
Monocytes Relative: 8.3 %
Neutro Abs: 2783 cells/uL (ref 1500–7800)
Neutrophils Relative %: 49.7 %
Platelets: 291 10*3/uL (ref 140–400)
RBC: 4.22 10*6/uL (ref 4.20–5.80)
RDW: 14.4 % (ref 11.0–15.0)
Total Lymphocyte: 36.3 %
WBC: 5.6 10*3/uL (ref 3.8–10.8)

## 2020-02-23 LAB — HEMOGLOBIN A1C
Hgb A1c MFr Bld: 6.3 % of total Hgb — ABNORMAL HIGH (ref <5.7)
Mean Plasma Glucose: 134 (calc)
eAG (mmol/L): 7.4 (calc)

## 2020-02-23 LAB — LIPID PANEL
Cholesterol: 140 mg/dL (ref <200)
HDL: 54 mg/dL (ref >=40)
LDL Cholesterol (Calc): 65 mg/dL (calc)
Non-HDL Cholesterol (Calc): 86 mg/dL (calc) (ref <130)
Total CHOL/HDL Ratio: 2.6 (calc) (ref <5.0)
Triglycerides: 128 mg/dL (ref <150)

## 2020-02-23 LAB — COMPLETE METABOLIC PANEL WITH GFR
AG Ratio: 1.2 (calc) (ref 1.0–2.5)
ALT: 11 U/L (ref 9–46)
AST: 16 U/L (ref 10–35)
Albumin: 4.2 g/dL (ref 3.6–5.1)
Alkaline phosphatase (APISO): 68 U/L (ref 35–144)
BUN/Creatinine Ratio: 15 (calc) (ref 6–22)
BUN: 19 mg/dL (ref 7–25)
CO2: 25 mmol/L (ref 20–32)
Calcium: 9.8 mg/dL (ref 8.6–10.3)
Chloride: 104 mmol/L (ref 98–110)
Creat: 1.28 mg/dL — ABNORMAL HIGH (ref 0.70–1.18)
GFR, Est African American: 65 mL/min/{1.73_m2} (ref >=60)
GFR, Est Non African American: 56 mL/min/{1.73_m2} — ABNORMAL LOW (ref >=60)
Globulin: 3.5 g/dL (calc) (ref 1.9–3.7)
Glucose, Bld: 87 mg/dL (ref 65–99)
Potassium: 4.4 mmol/L (ref 3.5–5.3)
Sodium: 140 mmol/L (ref 135–146)
Total Bilirubin: 0.3 mg/dL (ref 0.2–1.2)
Total Protein: 7.7 g/dL (ref 6.1–8.1)

## 2020-03-01 DIAGNOSIS — H2513 Age-related nuclear cataract, bilateral: Secondary | ICD-10-CM | POA: Diagnosis not present

## 2020-03-01 DIAGNOSIS — H43811 Vitreous degeneration, right eye: Secondary | ICD-10-CM | POA: Diagnosis not present

## 2020-03-01 DIAGNOSIS — E119 Type 2 diabetes mellitus without complications: Secondary | ICD-10-CM | POA: Diagnosis not present

## 2020-03-12 ENCOUNTER — Ambulatory Visit: Payer: Medicare Other | Admitting: Podiatry

## 2020-03-15 ENCOUNTER — Other Ambulatory Visit: Payer: Self-pay | Admitting: Family Medicine

## 2020-03-19 ENCOUNTER — Encounter: Payer: Self-pay | Admitting: Orthopaedic Surgery

## 2020-03-19 ENCOUNTER — Ambulatory Visit (INDEPENDENT_AMBULATORY_CARE_PROVIDER_SITE_OTHER): Payer: Medicare Other | Admitting: Orthopaedic Surgery

## 2020-03-19 VITALS — Ht 73.0 in | Wt 255.0 lb

## 2020-03-19 DIAGNOSIS — Z96641 Presence of right artificial hip joint: Secondary | ICD-10-CM

## 2020-03-19 MED ORDER — AMOXICILLIN 500 MG PO CAPS: ORAL_CAPSULE | ORAL | 0 refills | Status: DC

## 2020-03-19 NOTE — Progress Notes (Signed)
° °  Post-Op Visit Note   Patient: Curtis Hunt           Date of Birth: May 12, 1951           MRN: [id number] Visit Date: 03/19/2020 PCP: Susy Frizzle, MD   Assessment & Plan:  Chief Complaint:  Chief Complaint  Patient presents with   Right Hip - Follow-up    Right total hip arthroplasty 12/18/2019   Visit Diagnoses:  1. Status post total hip replacement, right     Plan: Patient is a pleasant 69 year old gentleman who comes in today 3 months out right total hip replacement 12/18/2019.  He is doing very well in regards to the right hip.  He has noticed increased pain to the left hip but this is tolerable.  Examination of his right hip reveals full range of motion without pain.  He is neurovascular intact distally.  Calves are soft nontender.  At this point, he will continue to advance with activity as tolerated.  He will follow up with [place] in 9 months time when he is 1 year out from surgery.  We will get AP pelvis x-rays at this point.  Call with concerns or questions in the meantime.  Dental prophylaxis reinforced.  Follow-Up Instructions: Return in about 9 months (around 12/17/2020).   Orders:  No orders of the defined types were placed in this encounter.  Meds ordered this encounter  Medications   amoxicillin (AMOXIL) 500 MG capsule    Sig: Take 4 pills one hour prior to dental work    Dispense:  12 capsule    Refill:  0    Imaging: No new imaging  PMFS History: Patient Active Problem List   Diagnosis Date Noted   Status post total replacement of right hip 12/18/2019   Primary osteoarthritis of right hip 09/08/2019   Malignant neoplasm of prostate ([place]) 04/29/2015   Rhabdomyolysis 06/09/2013   Dehydration 06/09/2013   Newly diagnosed diabetes ([place]) 06/09/2013   Dyslipidemia 31/54/0086   Metabolic encephalopathy 76/19/5093   Hypernatremia 06/09/2013   DKA (diabetic ketoacidoses) 06/08/2013   Weight loss 06/08/2013   Oral thrush 06/08/2013   GERD  (gastroesophageal reflux disease) 06/08/2013   Jerking movements of extremities 06/08/2013   Acute renal failure ([place]) 06/08/2013   Gout    Past Medical History:  Diagnosis Date   Arthritis    Cancer ([place]) 2004   Prostate CA with surgery   Diabetes mellitus without complication ([place])    Gout    Hyperlipidemia    Prostate cancer ([place])     Family History  Problem Relation Age of Onset   Cancer Brother        prostate   Cancer Mother        breast ca    Past Surgical History:  Procedure Laterality Date   PROSTATE SURGERY     TOTAL HIP ARTHROPLASTY Right 12/18/2019   Procedure: RIGHT TOTAL HIP ARTHROPLASTY ANTERIOR APPROACH;  Surgeon: Leandrew Koyanagi, MD;  Location: [place];  Service: Orthopedics;  Laterality: Right;   Social History   Occupational History   Not on file  Tobacco Use   Smoking status: Never Smoker   Smokeless tobacco: Never Used  Substance and Sexual Activity   Alcohol use: Yes    Comment: Beer prior to diagnosis of diabetes, about once a week during sports on tv   Drug use: No   Sexual activity: Not Currently

## 2020-03-20 ENCOUNTER — Telehealth: Payer: Self-pay | Admitting: *Deleted

## 2020-03-20 NOTE — Telephone Encounter (Signed)
Ortho bundle 90 day call attempted. No answer and left VM.

## 2020-03-22 ENCOUNTER — Telehealth: Payer: Self-pay | Admitting: *Deleted

## 2020-03-22 NOTE — Telephone Encounter (Signed)
Ortho bundle 90 day call completed. 

## 2020-03-27 ENCOUNTER — Ambulatory Visit: Payer: Medicare Other | Admitting: Podiatry

## 2020-03-27 ENCOUNTER — Other Ambulatory Visit: Payer: Self-pay

## 2020-03-27 ENCOUNTER — Encounter: Payer: Self-pay | Admitting: Podiatry

## 2020-03-27 DIAGNOSIS — M79675 Pain in left toe(s): Secondary | ICD-10-CM

## 2020-03-27 DIAGNOSIS — M79674 Pain in right toe(s): Secondary | ICD-10-CM

## 2020-03-27 DIAGNOSIS — E119 Type 2 diabetes mellitus without complications: Secondary | ICD-10-CM | POA: Diagnosis not present

## 2020-03-27 DIAGNOSIS — B351 Tinea unguium: Secondary | ICD-10-CM | POA: Diagnosis not present

## 2020-03-27 NOTE — Progress Notes (Addendum)
This patient returns to my office for at risk foot care.  This patient requires this care by a professional since this patient will be at risk due to having  Diabetes and chronic kidney disease.  This patient is unable to cut nails himself  since the patient cannot reach his  nails.These nails are painful walking and wearing shoes.  This patient presents for at risk foot care today.  General Appearance  Alert, conversant and in no acute stress.  Vascular  Dorsalis pedis and posterior tibial  pulses are palpable  bilaterally.  Capillary return is within normal limits  bilaterally. Temperature is within normal limits  bilaterally.  Neurologic  Senn-Weinstein monofilament wire test within normal limits  bilaterally. Muscle power within normal limits bilaterally.  Nails Thick disfigured discolored nails with subungual debris  from hallux to fifth toes bilaterally. No evidence of bacterial infection or drainage bilaterally. Hallux nail plate weakly attached.  Orthopedic  No limitations of motion  feet .  No crepitus or effusions noted.  No bony pathology or digital deformities noted. HAV  B/L.  DJD 1st MPJ right foot. Skin  normotropic skin with no porokeratosis noted bilaterally.  No signs of infections or ulcers noted.     Onychomycosis  Pain in right toes  Pain in left toes  Consent was obtained for treatment procedures.   Mechanical debridement of nails 1-5  bilaterally performed with a nail nipper.  Filed with dremel without incident. No infection or ulcer.     Return office visit  3 months         Told patient to return for periodic foot care and evaluation due to potential at risk complications.   Tyrick Dunagan DPM  

## 2020-03-29 ENCOUNTER — Other Ambulatory Visit: Payer: Self-pay | Admitting: Physician Assistant

## 2020-04-08 ENCOUNTER — Telehealth: Payer: Self-pay | Admitting: Pharmacist

## 2020-04-08 NOTE — Progress Notes (Signed)
Reviewed the chart to comply with patient's Adherence Report Data. Per Adherence data, the patient is 57% adherent for the month of October with Rosuvastatin 40 mg. The notes for medication mentioned state the patient is taking differently.  Fanny Skates, [place] Pharmacist Assistant [phone]

## 2020-06-04 NOTE — Chronic Care Management (AMB) (Signed)
Chronic Care Management Pharmacy  Name: Curtis Hunt  MRN: [id number] DOB: 1950-09-07  Chief Complaint/ HPI  Curtis Hunt,  70 y.o. , male presents for their Follow-Up CCM visit with the clinical pharmacist via telephone.  PCP : Susy Frizzle, MD  Their chronic conditions include: GERD, diabetes, gout, dyslipidemia.  Office Visits:  08/28/2019 Dennard Schaumann ) - severe hip pain, X-ray taken and referred to ortho for further evaluation and cortisone injection  Consult Visit: 03/19/2020 Erlinda Hong) - 3 months out from hip replacement, patient has full ROM  11/23/2019 Erlinda Hong, ortho) - right hip DJD follow up, cortisone injection previously provided minimal relief at best, patient ready for total hip replacement to improve quality of life  09/08/2019 Erlinda Hong, ortho) - given injection that provided short term relief, also started on meloxicam  Medications: Outpatient Encounter Medications as of 06/12/2020  Medication Sig  . allopurinol (ZYLOPRIM) 100 MG tablet Take 3 tablets (300 mg total) by mouth daily.  . ASPIRIN LOW DOSE 81 MG EC tablet TAKE 1 TABLET BY MOUTH TWICE A DAY  . B-D UF III MINI PEN NEEDLES 31G X 5 MM MISC USE AS DIRECTED TWICE A DAY  . Blood Glucose Monitoring Suppl (ONE TOUCH ULTRA 2) w/Device KIT AS DRIECTED  . Dulaglutide (TRULICITY) 1.5 GY/6.5LD SOPN Inject 1.5 mg as directed every Tuesday.  Marland Kitchen glucose blood (ONE TOUCH ULTRA TEST) test strip CHECK SUGAR 4-5 TIMES A DAY.EVERY MORNING AFTER MEALS AND AT BEDTIME  . insulin detemir (LEVEMIR FLEXTOUCH) 100 UNIT/ML FlexPen Inject 25 Units into the skin daily at 10 pm. (Patient taking differently: Inject 20 Units into the skin daily at 10 pm.)  . Insulin Pen Needle 31G X 6 MM MISC Use with Levemir Flextouch Pen  . meloxicam (MOBIC) 7.5 MG tablet Take 1 tablet (7.5 mg total) by mouth 2 (two) times daily as needed for pain.  . methocarbamol (ROBAXIN) 750 MG tablet Take 1 tablet (750 mg total) by mouth 2 (two) times daily as needed for  muscle spasms.  . ondansetron (ZOFRAN) 4 MG tablet Take 1-2 tablets (4-8 mg total) by mouth every 8 (eight) hours as needed for nausea or vomiting.  Glory Rosebush ULTRA test strip CHECK SUGAR 4 TO 5 TIMES DAILY EVERY MORNING, AFTER MEALS, AND AT BEDTIME E11.65  . rosuvastatin (CRESTOR) 40 MG tablet TAKE 1 TABLET BY MOUTH EVERY DAY (Patient taking differently: Take 40 mg by mouth daily.)  . sulfamethoxazole-trimethoprim (BACTRIM DS) 800-160 MG tablet Take 1 tablet by mouth 2 (two) times daily.  Marland Kitchen VIAGRA 100 MG tablet TAKE 0.5-1 TABLETS (50-100 MG TOTAL) BY MOUTH DAILY AS NEEDED FOR ERECTILE DYSFUNCTION.  . [DISCONTINUED] amoxicillin (AMOXIL) 500 MG capsule Take 4 pills one hour prior to dental work  . [DISCONTINUED] HYDROcodone-acetaminophen (NORCO) 5-325 MG tablet Take 1-2 tablets by mouth 3 (three) times daily as needed.  . [DISCONTINUED] oxyCODONE-acetaminophen (PERCOCET) 5-325 MG tablet Take 1-2 tablets by mouth every 8 (eight) hours as needed for severe pain.   No facility-administered encounter medications on file as of 06/12/2020.     Current Diagnosis/Assessment:  Goals Addressed            This Visit's Progress   . Pharmacy Care Plan:       CARE PLAN ENTRY (see longitudinal plan of care for additional care plan information)  Current Barriers:  . Chronic Disease Management support, education, and care coordination needs related to Diabetes, GERD, Gout, and dyslipidemia  Dyslipidemia Lab Results  Component Value Date/Time  [place] 71 10/24/2019 08:50 AM   . Pharmacist Clinical Goal(s): o Over the next 180 days, patient will work with PharmD and providers to maintain LDL goal < 70 . Current regimen:  o Rosuvastatin 45m . Interventions: o Reviewed most recent lipid panel o Counseled on importance of statin medications for overall heart benefit . Patient self care activities - Over the next 180 days, patient will: o Continue to take medication as directed o Focus on  medication adherence by pill count  Diabetes Lab Results  Component Value Date/Time   HGBA1C 6.1 (H) 10/24/2019 08:50 AM   HGBA1C 6.4 (H) 01/17/2019 08:10 AM   . Pharmacist Clinical Goal(s): o Over the next 180 days, patient will work with PharmD and providers to maintain A1c goal <7% . Current regimen:  o Trulicity 13.2RJweekly o Levemir 25 units daily . Interventions: o Reviewed home blood sugar readings o Reviewed most recent A1c  o Discussed current diet o Requested Rx for meter and test strips . Patient self care activities - Over the next 180 days, patient will: o Check blood sugar once daily, document, and provide at future appointments o Contact provider with any episodes of hypoglycemia o Contact provider should you not be able to afford either medication for blood sugar  GERD . Pharmacist Clinical Goal(s) o Over the next 180 days, patient will work with PharmD and providers to optimize medication and minimize symptoms related to GERD. . Current regimen:  o None . Interventions: o Discussed frequency of symptoms. o Discussed trigger foods. . Patient self care activities - Over the next 180 days, patient will: o Contact provider with any increase in symptom frequency. o May take OTC Tums periodically for symptom relief. Gout . Pharmacist Clinical Goal(s) o Over the next 180 days, patient will work with PharmD and providers to optimize medication and minimize symptoms related to gout. . Current regimen:  o No medications currently . Interventions: o Discussed frequency of gout symptoms . Patient self care activities - Over the next 180 days, patient will: o Continue to take medication as directed o Contact providers with increase in symptoms   Please see past updates related to this goal by clicking on the "Past Updates" button in the selected goal         Diabetes   Recent Relevant Labs: Lab Results  Component Value Date/Time   HGBA1C 6.3 (H)  02/22/2020 09:05 AM   HGBA1C 6.1 (H) 10/24/2019 08:50 AM   MICROALBUR 2.6 02/22/2020 09:05 AM   MICROALBUR 6.0 01/17/2019 08:19 AM     Checking BG: Daily  Recent FBG Readings: 92-105  Recent 2hr PP BG readings:  N/A  Patient is currently controlled on the following medications:   Trulicity 11.8AC/1.6SA Levemir 100u/ml 25 units daily  Last diabetic Foot exam:  Lab Results  Component Value Date/Time   HMDIABEYEEXA No Retinopathy 10/19/2017 12:00 AM    Last diabetic Eye exam: No results found for: HMDIABFOOTEX   Patient reports compliance with all medications.  Loves the convenience of Trulicity.  Denies any episodes of hypoglycemia.  Does admit to high cost on Trulicity (~~630/ZS), however has a total income of around 70k per year which rules him out of patient assistance.  Patient is not at risk of hardship due to cost of medication.  Reports great diet and verbalizes knowledge of what to limit servings of.   UPDATE 06/12/2020 Patient fasting sugars are all within range.  He reports feeling well overall, denies issues  with current copays and is adherent with all medications.  Patient also requests new prescription for meter and test strips to be sent in to his pharmacy Plan  Continue current medications, contact providers should copay on Trulicity become a burden.  Hyperlipidemia   LDL goal < 70  Lipid Panel     Component Value Date/Time   CHOL 140 02/22/2020 0905   TRIG 128 02/22/2020 0905   HDL 54 02/22/2020 0905   LDLCALC 65 02/22/2020 0905    Hepatic Function Latest Ref Rng & Units 02/22/2020 12/14/2019 10/24/2019  Total Protein 6.1 - 8.1 g/dL 7.7 8.2(H) 7.2  Albumin 3.5 - 5.0 g/dL - 3.8 -  AST 10 - 35 U/L _0 ALT 9 - 46 U/L _1 Alk Phosphatase 38 - 126 U/L - 70 -  Total Bilirubin 0.2 - 1.2 mg/dL 0.3 0.5 0.2     The 10-year ASCVD risk score Mikey Bussing DC Jr., et al., 2013) is: 16.8%   Values used to calculate the score:     Age: 48 years     Sex:  Male     Is Non-Hispanic African American: Yes     Diabetic: Yes     Tobacco smoker: No     Systolic Blood Pressure: 277 mmHg     Is BP treated: No     HDL Cholesterol: 54 mg/dL     Total Cholesterol: 140 mg/dL   Patient has failed these meds in past: none noted Patient is currently controlled on the following medications:  . Rosuvastatin 40 mg daily  Patient reports compliance and proper timing of medication.  Denies myalgias.  LDL WNL during most recent lipid panel.  Patient reports good overall diet.   UPDATE 06/12/20 Continues adherence Lipids remain controlled  Plan  Continue current medications GERD   Patient has failed these meds in past: PPIs Patient is currently controlled on the following medications:  . None currently  Patient does mention slight symptoms of acid reflux after certain trigger foods, especially spicy.  Currently just deals with the symptoms with no medication.  Discussed Tums as option for short term relief.  Plan Recommend Tums OTC as option for short term relief of acid reflux.    Gout    Patient has failed these meds in past: uloric Patient is currently controlled on the following medications:   No medications currently  Patient has not had gout attack in quite a while.  No current complaints  UPDATE 06/12/20 Not currently taking his gout medication He has not had episode in a while Counseled on allopurinol as preventative  Plan  Contact providers if symptoms arise.  Vaccines   Reviewed and discussed patient's vaccination history.    Immunization History  Administered Date(s) Administered  . Fluad Quad(high Dose 65+) 02/14/2019, 02/22/2020  . Influenza Split 02/22/2013, 02/22/2014  . Influenza-Unspecified 02/19/2015, 02/13/2016, 02/22/2017  . PFIZER(Purple Top)SARS-COV-2 Vaccination 06/19/2019, 07/10/2019  . PPD Test 09/13/2012, 08/02/2013, 08/20/2014, 09/03/2015, 09/21/2016  . Pneumococcal Conjugate-13 09/24/2017  .  Pneumococcal Polysaccharide-23 05/11/2014, 02/22/2020  . Tdap 09/10/2010  . Zoster 09/13/2012    Plan  Recommended patient receive Shingrix vaccine in office.  Medication Management   . Miscellaneous medications:  o Viagra 147m o meloxicam 7.535mdaily . OTC's:  o ASA 8161m Patient currently uses [facility] pharmacy.  Phone #  [phone] reports using no specific method to organize medications and promote adherence. . Patient denies missed doses of medication.  Beverly Milch, PharmD Clinical Pharmacist [place] [phone]

## 2020-06-12 ENCOUNTER — Other Ambulatory Visit: Payer: Self-pay

## 2020-06-12 ENCOUNTER — Ambulatory Visit: Payer: Medicare Other | Admitting: Pharmacist

## 2020-06-12 DIAGNOSIS — E119 Type 2 diabetes mellitus without complications: Secondary | ICD-10-CM

## 2020-06-12 DIAGNOSIS — E785 Hyperlipidemia, unspecified: Secondary | ICD-10-CM

## 2020-06-12 DIAGNOSIS — Z794 Long term (current) use of insulin: Secondary | ICD-10-CM

## 2020-06-12 MED ORDER — ONETOUCH ULTRA 2 W/DEVICE KIT: PACK | 0 refills | Status: AC

## 2020-06-12 NOTE — Patient Instructions (Addendum)
Visit Information  Goals Addressed            This Visit's Progress   . Pharmacy Care Plan:       CARE PLAN ENTRY (see longitudinal plan of care for additional care plan information)  Current Barriers:  . Chronic Disease Management support, education, and care coordination needs related to Diabetes, GERD, Gout, and dyslipidemia  Dyslipidemia Lab Results  Component Value Date/Time   LDLCALC 71 10/24/2019 08:50 AM   . Pharmacist Clinical Goal(s): o Over the next 180 days, patient will work with PharmD and providers to maintain LDL goal < 70 . Current regimen:  o Rosuvastatin 40mg  . Interventions: o Reviewed most recent lipid panel o Counseled on importance of statin medications for overall heart benefit . Patient self care activities - Over the next 180 days, patient will: o Continue to take medication as directed o Focus on medication adherence by pill count  Diabetes Lab Results  Component Value Date/Time   HGBA1C 6.1 (H) 10/24/2019 08:50 AM   HGBA1C 6.4 (H) 01/17/2019 08:10 AM   . Pharmacist Clinical Goal(s): o Over the next 180 days, patient will work with PharmD and providers to maintain A1c goal <7% . Current regimen:  o Trulicity 1.5mg  weekly o Levemir 25 units daily . Interventions: o Reviewed home blood sugar readings o Reviewed most recent A1c  o Discussed current diet o Requested Rx for meter and test strips . Patient self care activities - Over the next 180 days, patient will: o Check blood sugar once daily, document, and provide at future appointments o Contact provider with any episodes of hypoglycemia o Contact provider should you not be able to afford either medication for blood sugar  GERD . Pharmacist Clinical Goal(s) o Over the next 180 days, patient will work with PharmD and providers to optimize medication and minimize symptoms related to GERD. . Current regimen:  o None . Interventions: o Discussed frequency of symptoms. o Discussed  trigger foods. . Patient self care activities - Over the next 180 days, patient will: o Contact provider with any increase in symptom frequency. o May take OTC Tums periodically for symptom relief. Gout . Pharmacist Clinical Goal(s) o Over the next 180 days, patient will work with PharmD and providers to optimize medication and minimize symptoms related to gout. . Current regimen:  o No medications currently . Interventions: o Discussed frequency of gout symptoms . Patient self care activities - Over the next 180 days, patient will: o Continue to take medication as directed o Contact providers with increase in symptoms   Please see past updates related to this goal by clicking on the "Past Updates" button in the selected goal         The patient verbalized understanding of instructions, educational materials, and care plan provided today and agreed to receive a mailed copy of patient instructions, educational materials, and care plan.   Telephone follow up appointment with pharmacy team member scheduled for: 6 months  Edythe Clarity, [facility]  Blood Glucose Monitoring, Adult Monitoring your blood sugar (glucose) is an important part of managing your diabetes. Blood glucose monitoring involves checking your blood glucose as often as directed and keeping a log or record of your results over time. Checking your blood glucose regularly and keeping a blood glucose log can:  Help you and your health care provider adjust your diabetes management plan as needed, including your medicines or insulin.  Help you understand how food, exercise, illnesses, and medicines  affect your blood glucose.  Let you know what your blood glucose is at any time. You can quickly find out if you have low blood glucose (hypoglycemia) or high blood glucose (hyperglycemia). Your health care provider will set individualized treatment goals for you. Your goals will be based on your age, other medical conditions you  have, and how you respond to diabetes treatment. Generally, the goal of treatment is to maintain the following blood glucose levels:  Before meals (preprandial): 80-130 mg/dL (4.4-7.2 mmol/L).  After meals (postprandial): below 180 mg/dL (10 mmol/L).  A1C level: less than 7%. Supplies needed:  Blood glucose meter.  Test strips for your meter. Each meter has its own strips. You must use the strips that came with your meter.  A needle to prick your finger (lancet). Do not use a lancet more than one time.  A device that holds the lancet (lancing device).  A journal or log book to write down your results. How to check your blood glucose Checking your blood glucose 1. Wash your hands for at least 20 seconds with soap and water. 2. Prick the side of your finger (not the tip) with the lancet. Do not use the same finger consecutively. 3. Gently rub the finger until a small drop of blood appears. 4. Follow instructions that come with your meter for inserting the test strip, applying blood to the strip, and using your blood glucose meter. 5. Write down your result and any notes in your log.   Using alternative sites Some meters allow you to use areas of your body other than your finger (alternative sites) to test your blood. The most common alternative sites are the forearm, the thigh, and the palm of your hand. Alternative sites may not be as accurate as the fingers because blood flow is slower in those areas. This means that the result you get may be delayed, and it may be different from the result that you would get from your finger. Use the finger only, and do not use alternative sites, if:  You think you have hypoglycemia.  You sometimes do not know that your blood glucose is getting low (hypoglycemia unawareness). General tips and recommendations Blood glucose log  Every time you check your blood glucose, write down your result. Also write down any notes about things that may be  affecting your blood glucose, such as your diet and exercise for the day. This information can help you and your health care provider: ? Look for patterns in your blood glucose over time. ? Adjust your diabetes management plan as needed.  Check if your meter allows you to download your records to a computer or if there is an app for the meter. Most glucose meters store a record of glucose readings in the meter.   If you have type 1 diabetes:  Check your blood glucose 4 or more times a day if you are on intensive insulin therapy with multiple daily injections (MDI) or if you are using an insulin pump. Check your blood glucose: ? Before every meal and snack. ? Before bedtime.  Also check your blood glucose: ? If you have symptoms of hypoglycemia. ? After treating low blood glucose. ? Before doing activities that create a risk for injury, like driving or using machinery. ? Before and after exercise. ? Two hours after a meal. ? Occasionally between 2:00 a.m. and 3:00 a.m., as directed.  You may need to check your blood glucose more often, 6-10 times per day,  if: ? You have diabetes that is not well controlled. ? You are ill. ? You have a history of severe hypoglycemia. ? You have hypoglycemia unawareness. If you have type 2 diabetes:  Check your blood glucose 2 or more times a day if you take insulin or other diabetes medicines.  Check your blood glucose 4 or more times a day if you are on intensive insulin therapy. Occasionally, you may also need to check your glucose between 2:00 a.m. and 3:00 a.m., as directed.  Also check your blood glucose: ? Before and after exercise. ? Before doing activities that create a risk for injury, like driving or using machinery.  You may need to check your blood glucose more often if: ? Your medicine is being adjusted. ? Your diabetes is not well controlled. ? You are ill. General tips  Make sure you always have your supplies with you.  After  you use a few boxes of test strips, adjust (calibrate) your blood glucose meter by following instructions that came with your meter.  If you have questions or need help, all blood glucose meters have a 24-hour hotline phone number available that you can call. Also contact your health care provider with questions or concerns you may have. Where to find more information  The American Diabetes Association: www.diabetes.org  The Association of Diabetes Care & Education Specialists: www.diabeteseducator.org Contact a health care provider if:  Your blood glucose is at or above 240 mg/dL (13.3 mmol/L) for 2 days in a row.  You have been sick or have had a fever for 2 days or longer, and you are not getting better.  You have any of the following problems for more than 6 hours: ? You cannot eat or drink. ? You have nausea or vomiting. ? You have diarrhea. Get help right away if:  Your blood glucose is lower than 54 mg/dL (3 mmol/L).  You become confused, or you have trouble thinking clearly.  You have difficulty breathing.  You have moderate or large ketone levels in your urine. These symptoms may represent a serious problem that is an emergency. Do not wait to see if the symptoms will go away. Get medical help right away. Call your local emergency services (911 in the [place] Do not drive yourself to the hospital. Summary  Monitoring your blood glucose is an important part of managing your diabetes.  Blood glucose monitoring involves checking your blood glucose as often as directed and keeping a log or record of your results over time.  Your health care provider will set individualized treatment goals for you. Your goals will be based on your age, other medical conditions you have, and how you respond to diabetes treatment.  Every time you check your blood glucose, write down your result. Also, write down any notes about things that may be affecting your blood glucose, such as your diet  and exercise for the day. This information is not intended to replace advice given to you by your health care provider. Make sure you discuss any questions you have with your health care provider. Document Revised: 02/07/2020 Document Reviewed: 02/07/2020 Elsevier Patient Education  2021 [employer].

## 2020-06-14 ENCOUNTER — Telehealth: Payer: Self-pay | Admitting: Family Medicine

## 2020-06-14 NOTE — Telephone Encounter (Signed)
Duplicate request

## 2020-06-14 NOTE — Telephone Encounter (Signed)
Fax from [facility] requesting one touch ultra 2 glucose sys  CB# [phone]

## 2020-06-17 ENCOUNTER — Telehealth: Payer: Self-pay | Admitting: *Deleted

## 2020-06-17 NOTE — Telephone Encounter (Signed)
-----   Message from [place], LPN sent at 7/59/1638 11:00 AM EST -----  ----- Message ----- From: Edythe Clarity, [facility] Sent: 06/13/2020   1:55 PM EST To: Saundra Shelling, [place]  Patient has requested a new Rx for Onetouch Ultra 2 meter and for test strips to be sent to [facility] on [address].  Thanks!  Beverly Milch, PharmD Clinical Pharmacist [place] [phone]

## 2020-06-17 NOTE — Telephone Encounter (Signed)
Prescriptions were sent on 06/12/2020.

## 2020-06-19 ENCOUNTER — Ambulatory Visit: Payer: Medicare Other | Admitting: Orthopaedic Surgery

## 2020-06-19 ENCOUNTER — Telehealth: Payer: Self-pay | Admitting: *Deleted

## 2020-06-19 ENCOUNTER — Ambulatory Visit: Payer: Self-pay

## 2020-06-19 DIAGNOSIS — M1612 Unilateral primary osteoarthritis, left hip: Secondary | ICD-10-CM

## 2020-06-19 DIAGNOSIS — Z96641 Presence of right artificial hip joint: Secondary | ICD-10-CM | POA: Diagnosis not present

## 2020-06-19 NOTE — Telephone Encounter (Signed)
6 month Ortho bundle contact today while in office for F/U with MD. Right THA doing well per MD and patient pleased with progress.

## 2020-06-19 NOTE — Progress Notes (Signed)
Office Visit Note   Patient: Curtis Hunt           Date of Birth: Dec 06, 1950           MRN: [id number] Visit Date: 06/19/2020              Requested by: Susy Frizzle, MD [address] [place],  [place] [address] PCP: Susy Frizzle, MD   Assessment & Plan: Visit Diagnoses:  1. Status post total hip replacement, right   2. Primary localized osteoarthritis of left hip     Plan: Impression is status post right total hip replacement and advanced degenerative joint disease left hip.  In regards to the right hip, he is doing well.  He will follow up with [place] in 6 months time when he is 1 year out from surgery.  In regards to the left hip, he would like to proceed with total hip replacement this coming March or April.  He will follow up with [place] when he is ready and we will obtain a new hemoglobin AIC.  Follow-Up Instructions: Return in about 6 months (around 12/17/2020).   Orders:  Orders Placed This Encounter  Procedures  . XR Pelvis 1-2 Views   No orders of the defined types were placed in this encounter.     Procedures: No procedures performed   Clinical Data: No additional findings.   Subjective: Chief Complaint  Patient presents with  . Other     Follow up    HPI patient is a pleasant 70 year old gentleman who comes in today 6 months out right total hip replacement.  He is doing very well in regards to his right hip.  He is starting to notice increased pain to the left hip.  The pain he has is primarily to the groin and is worse with activity.  He denies any pain at night.  No previous cortisone injection to the left hip.  Review of Systems as detailed in HPI.  All others reviewed and are negative.   Objective: Vital Signs: There were no vitals taken for this visit.  Physical Exam well-developed well-nourished gentleman in no acute distress.  Alert oriented x3.  Ortho Exam right hip exam reveals full range of motion.  Negative logroll.  Left hip  exam there is a positive logroll with significant limitation in internal rotation.  He is neurovascular intact distally.  Specialty Comments:  No specialty comments available.  Imaging: XR Pelvis 1-2 Views  Result Date: 06/19/2020 X-rays of the left hip show advanced degenerative changes.  Right hip shows a well-seated prosthesis without complication    PMFS History: Patient Active Problem List   Diagnosis Date Noted  . Status post total replacement of right hip 12/18/2019  . Primary osteoarthritis of right hip 09/08/2019  . Malignant neoplasm of prostate ([place]) 04/29/2015  . Rhabdomyolysis 06/09/2013  . Dehydration 06/09/2013  . Newly diagnosed diabetes ([place]) 06/09/2013  . Dyslipidemia 06/09/2013  . Metabolic encephalopathy 54/00/8676  . Hypernatremia 06/09/2013  . DKA (diabetic ketoacidoses) 06/08/2013  . Weight loss 06/08/2013  . Oral thrush 06/08/2013  . GERD (gastroesophageal reflux disease) 06/08/2013  . Jerking movements of extremities 06/08/2013  . Acute renal failure ([place]) 06/08/2013  . Gout    Past Medical History:  Diagnosis Date  . Arthritis   . Cancer [facility]) 2004   Prostate CA with surgery  . Diabetes mellitus without complication ([place])   . Gout   . Hyperlipidemia   . Prostate  cancer [facility])     Family History  Problem Relation Age of Onset  . Cancer Brother        prostate  . Cancer Mother        breast ca    Past Surgical History:  Procedure Laterality Date  . PROSTATE SURGERY    . TOTAL HIP ARTHROPLASTY Right 12/18/2019   Procedure: RIGHT TOTAL HIP ARTHROPLASTY ANTERIOR APPROACH;  Surgeon: Leandrew Koyanagi, MD;  Location: [place];  Service: Orthopedics;  Laterality: Right;   Social History   Occupational History  . Not on file  Tobacco Use  . Smoking status: Never Smoker  . Smokeless tobacco: Never Used  Substance and Sexual Activity  . Alcohol use: Yes    Comment: Beer prior to diagnosis of diabetes, about once a week during sports on tv  . Drug  use: No  . Sexual activity: Not Currently

## 2020-06-24 ENCOUNTER — Other Ambulatory Visit: Payer: Self-pay | Admitting: Family Medicine

## 2020-07-03 ENCOUNTER — Other Ambulatory Visit: Payer: Self-pay

## 2020-07-03 ENCOUNTER — Other Ambulatory Visit: Payer: Self-pay | Admitting: Family Medicine

## 2020-07-03 ENCOUNTER — Ambulatory Visit: Payer: Medicare Other | Admitting: Podiatry

## 2020-07-03 ENCOUNTER — Encounter: Payer: Self-pay | Admitting: Podiatry

## 2020-07-03 DIAGNOSIS — B351 Tinea unguium: Secondary | ICD-10-CM | POA: Diagnosis not present

## 2020-07-03 DIAGNOSIS — R194 Change in bowel habit: Secondary | ICD-10-CM | POA: Insufficient documentation

## 2020-07-03 DIAGNOSIS — M79674 Pain in right toe(s): Secondary | ICD-10-CM | POA: Diagnosis not present

## 2020-07-03 DIAGNOSIS — D509 Iron deficiency anemia, unspecified: Secondary | ICD-10-CM | POA: Insufficient documentation

## 2020-07-03 DIAGNOSIS — Z1211 Encounter for screening for malignant neoplasm of colon: Secondary | ICD-10-CM | POA: Insufficient documentation

## 2020-07-03 DIAGNOSIS — M79675 Pain in left toe(s): Secondary | ICD-10-CM

## 2020-07-03 DIAGNOSIS — Z860100 Personal history of colon polyps, unspecified: Secondary | ICD-10-CM | POA: Insufficient documentation

## 2020-07-03 DIAGNOSIS — E119 Type 2 diabetes mellitus without complications: Secondary | ICD-10-CM | POA: Diagnosis not present

## 2020-07-03 DIAGNOSIS — K921 Melena: Secondary | ICD-10-CM | POA: Insufficient documentation

## 2020-07-03 DIAGNOSIS — N179 Acute kidney failure, unspecified: Secondary | ICD-10-CM

## 2020-07-03 DIAGNOSIS — K5730 Diverticulosis of large intestine without perforation or abscess without bleeding: Secondary | ICD-10-CM | POA: Insufficient documentation

## 2020-07-03 DIAGNOSIS — K449 Diaphragmatic hernia without obstruction or gangrene: Secondary | ICD-10-CM | POA: Insufficient documentation

## 2020-07-03 NOTE — Progress Notes (Signed)
This patient returns to my office for at risk foot care.  This patient requires this care by a professional since this patient will be at risk due to having  Diabetes and chronic kidney disease.  This patient is unable to cut nails himself  since the patient cannot reach his  nails.These nails are painful walking and wearing shoes.  This patient presents for at risk foot care today.  General Appearance  Alert, conversant and in no acute stress.  Vascular  Dorsalis pedis and posterior tibial  pulses are palpable  bilaterally.  Capillary return is within normal limits  bilaterally. Temperature is within normal limits  bilaterally.  Neurologic  Senn-Weinstein monofilament wire test within normal limits  bilaterally. Muscle power within normal limits bilaterally.  Nails Thick disfigured discolored nails with subungual debris  from hallux to fifth toes bilaterally. No evidence of bacterial infection or drainage bilaterally. Hallux nail plate weakly attached.  Orthopedic  No limitations of motion  feet .  No crepitus or effusions noted.  No bony pathology or digital deformities noted. HAV  B/L.  DJD 1st MPJ right foot. Skin  normotropic skin with no porokeratosis noted bilaterally.  No signs of infections or ulcers noted.     Onychomycosis  Pain in right toes  Pain in left toes  Consent was obtained for treatment procedures.   Mechanical debridement of nails 1-5  bilaterally performed with a nail nipper.  Filed with dremel without incident. No infection or ulcer.     Return office visit  3 months         Told patient to return for periodic foot care and evaluation due to potential at risk complications.   Gardiner Barefoot DPM

## 2020-07-10 ENCOUNTER — Other Ambulatory Visit: Payer: Self-pay | Admitting: Family Medicine

## 2020-07-29 ENCOUNTER — Other Ambulatory Visit: Payer: Self-pay

## 2020-07-29 ENCOUNTER — Ambulatory Visit: Payer: Medicare Other

## 2020-07-29 ENCOUNTER — Other Ambulatory Visit: Payer: Medicare Other

## 2020-08-27 ENCOUNTER — Other Ambulatory Visit (INDEPENDENT_AMBULATORY_CARE_PROVIDER_SITE_OTHER): Payer: Medicare Other

## 2020-08-27 ENCOUNTER — Other Ambulatory Visit: Payer: Self-pay

## 2020-08-27 ENCOUNTER — Ambulatory Visit: Payer: Medicare Other

## 2020-08-27 VITALS — BP 148/96

## 2020-08-27 DIAGNOSIS — E7800 Pure hypercholesterolemia, unspecified: Secondary | ICD-10-CM | POA: Diagnosis not present

## 2020-08-27 DIAGNOSIS — Z794 Long term (current) use of insulin: Secondary | ICD-10-CM | POA: Diagnosis not present

## 2020-08-27 DIAGNOSIS — E785 Hyperlipidemia, unspecified: Secondary | ICD-10-CM | POA: Diagnosis not present

## 2020-08-27 DIAGNOSIS — Z111 Encounter for screening for respiratory tuberculosis: Secondary | ICD-10-CM

## 2020-08-27 DIAGNOSIS — E119 Type 2 diabetes mellitus without complications: Secondary | ICD-10-CM | POA: Diagnosis not present

## 2020-08-28 ENCOUNTER — Telehealth: Payer: Self-pay | Admitting: Pharmacist

## 2020-08-28 LAB — CBC WITH DIFFERENTIAL/PLATELET
Absolute Monocytes: 490 cells/uL (ref 200–950)
Basophils Absolute: 112 cells/uL (ref 0–200)
Basophils Relative: 1.9 %
Eosinophils Absolute: 159 cells/uL (ref 15–500)
Eosinophils Relative: 2.7 %
HCT: 38.8 % (ref 38.5–50.0)
Hemoglobin: 12.6 g/dL — ABNORMAL LOW (ref 13.2–17.1)
Lymphs Abs: 2165 cells/uL (ref 850–3900)
MCH: 27.9 pg (ref 27.0–33.0)
MCHC: 32.5 g/dL (ref 32.0–36.0)
MCV: 85.8 fL (ref 80.0–100.0)
MPV: 9.9 fL (ref 7.5–12.5)
Monocytes Relative: 8.3 %
Neutro Abs: 2974 cells/uL (ref 1500–7800)
Neutrophils Relative %: 50.4 %
Platelets: 259 10*3/uL (ref 140–400)
RBC: 4.52 10*6/uL (ref 4.20–5.80)
RDW: 14.4 % (ref 11.0–15.0)
Total Lymphocyte: 36.7 %
WBC: 5.9 10*3/uL (ref 3.8–10.8)

## 2020-08-28 LAB — COMPLETE METABOLIC PANEL WITH GFR
AG Ratio: 1.1 (calc) (ref 1.0–2.5)
ALT: 16 U/L (ref 9–46)
AST: 18 U/L (ref 10–35)
Albumin: 3.9 g/dL (ref 3.6–5.1)
Alkaline phosphatase (APISO): 63 U/L (ref 35–144)
BUN: 19 mg/dL (ref 7–25)
CO2: 24 mmol/L (ref 20–32)
Calcium: 9.6 mg/dL (ref 8.6–10.3)
Chloride: 105 mmol/L (ref 98–110)
Creat: 1.17 mg/dL (ref 0.70–1.18)
GFR, Est African American: 73 mL/min/{1.73_m2} (ref >=60)
GFR, Est Non African American: 63 mL/min/{1.73_m2} (ref >=60)
Globulin: 3.4 g/dL (calc) (ref 1.9–3.7)
Glucose, Bld: 91 mg/dL (ref 65–99)
Potassium: 4.2 mmol/L (ref 3.5–5.3)
Sodium: 140 mmol/L (ref 135–146)
Total Bilirubin: 0.2 mg/dL (ref 0.2–1.2)
Total Protein: 7.3 g/dL (ref 6.1–8.1)

## 2020-08-28 LAB — HEMOGLOBIN A1C
Hgb A1c MFr Bld: 6.4 % of total Hgb — ABNORMAL HIGH (ref <5.7)
Mean Plasma Glucose: 137 mg/dL
eAG (mmol/L): 7.6 mmol/L

## 2020-08-28 LAB — LIPID PANEL
Cholesterol: 128 mg/dL (ref <200)
HDL: 50 mg/dL (ref >=40)
LDL Cholesterol (Calc): 59 mg/dL (calc)
Non-HDL Cholesterol (Calc): 78 mg/dL (calc) (ref <130)
Total CHOL/HDL Ratio: 2.6 (calc) (ref <5.0)
Triglycerides: 107 mg/dL (ref <150)

## 2020-08-28 NOTE — Progress Notes (Addendum)
Chronic Care Management Pharmacy Assistant   Name: GAYNOR FERRERAS  MRN: [id number] DOB: June 15, 1950   Reason for Encounter: Disease State For DM.   Conditions to be addressed/monitored: GERD, diabetes, gout, dyslipidemia  Recent office visits:  None since 06/12/20  Recent consult visits:  07/03/20 Podiatry Gardiner Barefoot, DPM. For Onychomycosis. No medication changes. 06/19/20 Orthopedics Leandrew Koyanagi, MD. For right hip replacement. Xrays taken. No medication changes.   Hospital visits:  None in previous 6 months  Medications: Outpatient Encounter Medications as of 08/28/2020  Medication Sig   allopurinol (ZYLOPRIM) 100 MG tablet Take 3 tablets (300 mg total) by mouth daily.   ASPIRIN LOW DOSE 81 MG EC tablet TAKE 1 TABLET BY MOUTH TWICE A DAY   B-D UF III MINI PEN NEEDLES 31G X 5 MM MISC USE AS DIRECTED TWICE A DAY   Blood Glucose Monitoring Suppl (ONE TOUCH ULTRA 2) w/Device KIT ONE TOUCH ULTRA 2 GLUCOSE SYSTEM W/ DEVICE KIT   glucose blood (ONE TOUCH ULTRA TEST) test strip CHECK SUGAR 4-5 TIMES A DAY.EVERY MORNING AFTER MEALS AND AT BEDTIME   Insulin Pen Needle 31G X 6 MM MISC Use with Levemir Flextouch Pen   LEVEMIR FLEXTOUCH 100 UNIT/ML FlexPen INJECT 25 UNITS INTO THE SKIN DAILY AT 10 PM.   meloxicam (MOBIC) 7.5 MG tablet Take 1 tablet (7.5 mg total) by mouth 2 (two) times daily as needed for pain.   methocarbamol (ROBAXIN) 750 MG tablet Take 1 tablet (750 mg total) by mouth 2 (two) times daily as needed for muscle spasms.   ondansetron (ZOFRAN) 4 MG tablet Take 1-2 tablets (4-8 mg total) by mouth every 8 (eight) hours as needed for nausea or vomiting.   ONETOUCH ULTRA test strip CHECK SUGAR 4 TO 5 TIMES DAILY EVERY MORNING, AFTER MEALS, AND AT BEDTIME E11.65   rosuvastatin (CRESTOR) 40 MG tablet TAKE 1 TABLET BY MOUTH EVERY DAY   sulfamethoxazole-trimethoprim (BACTRIM DS) 800-160 MG tablet Take 1 tablet by mouth 2 (two) times daily.   TRULICITY 1.5 OE/3.2ZY SOPN INJECT 1.5  MG AS DIRECTED EVERY TUESDAY.   VIAGRA 100 MG tablet TAKE 0.5-1 TABLETS (50-100 MG TOTAL) BY MOUTH DAILY AS NEEDED FOR ERECTILE DYSFUNCTION.   No facility-administered encounter medications on file as of 08/28/2020.    Recent Relevant Labs: Lab Results  Component Value Date/Time   HGBA1C 6.4 (H) 08/27/2020 09:08 AM   HGBA1C 6.3 (H) 02/22/2020 09:05 AM   MICROALBUR 2.6 02/22/2020 09:05 AM   MICROALBUR 6.0 01/17/2019 08:19 AM    Kidney Function Lab Results  Component Value Date/Time   CREATININE 1.17 08/27/2020 09:08 AM   CREATININE 1.28 (H) 02/22/2020 09:05 AM   GFRNONAA 63 08/27/2020 09:08 AM   GFRAA 73 08/27/2020 09:08 AM    Current antihyperglycemic regimen:  Trulicity 1.5 YQ/8.2 ml  Inject 1.5 mg once a week on Saturday LEVEMIR Flextouch 100 unit/ML 22 Units at 10 pm  What recent interventions/DTPs have been made to improve glycemic control:  None  Have there been any recent hospitalizations or ED visits since last visit with CPP? Patient stated no.  Patient denies hypoglycemic symptoms, including None   Patient reports hyperglycemic symptoms, including fatigue   How often are you checking your blood sugar? once daily   What are your blood sugars ranging? Patient stated his AM readings are usually around 90-117.  During the week, how often does your blood glucose drop below 70? Patient stated no.  Are you checking your feet  daily/regularly?  Patient stated he checks his feet daily. He stated a nurse comes yearly to look at this feet as well as he goes to see a podiatrist regularly.   Adherence Review: Is the patient currently on a STATIN medication? Yes, Rosuvastatin 40 mg  Is the patient currently on ACE/ARB medication? No.  Does the patient have >5 day gap between last estimated fill dates? Per misc rpts, no.  Star Rating Drugs:Trulicity 1.5 XL/2.4 ml , Rosuvastatin 40 mg 90 DS 07/10/20  Patient stated he does not have questions or concerns about his  medications at this time.    Follow-Up:Pharmacist Review  Charlann Lange, RMA Clinical Pharmacist Assistant [phone]  10 minutes spent in review, coordination, and documentation.  Reviewed by: Beverly Milch, PharmD Clinical Pharmacist [facility] [phone]

## 2020-08-29 LAB — QUANTIFERON-TB GOLD PLUS
Mitogen-NIL: >10 IU/mL
NIL: 0.12 IU/mL
QuantiFERON-TB Gold Plus: NEGATIVE
TB1-NIL: <0 IU/mL
TB2-NIL: 0.2 IU/mL

## 2020-09-10 ENCOUNTER — Other Ambulatory Visit: Payer: Self-pay

## 2020-09-19 ENCOUNTER — Other Ambulatory Visit (HOSPITAL_COMMUNITY): Payer: Medicare Other

## 2020-09-23 ENCOUNTER — Other Ambulatory Visit: Payer: Self-pay | Admitting: Physician Assistant

## 2020-09-23 MED ORDER — ONDANSETRON HCL 4 MG PO TABS: 4.0000 mg | ORAL_TABLET | Freq: Three times a day (TID) | ORAL | 0 refills | Status: DC | PRN

## 2020-09-23 MED ORDER — ASPIRIN EC 81 MG PO TBEC: 81.0000 mg | DELAYED_RELEASE_TABLET | Freq: Two times a day (BID) | ORAL | 0 refills | Status: AC

## 2020-09-23 MED ORDER — SULFAMETHOXAZOLE-TRIMETHOPRIM 800-160 MG PO TABS: 1.0000 | ORAL_TABLET | Freq: Two times a day (BID) | ORAL | 0 refills | Status: DC

## 2020-09-23 MED ORDER — DOCUSATE SODIUM 100 MG PO CAPS: 100.0000 mg | ORAL_CAPSULE | Freq: Every day | ORAL | 2 refills | Status: DC | PRN

## 2020-09-23 MED ORDER — METHOCARBAMOL 500 MG PO TABS: 500.0000 mg | ORAL_TABLET | Freq: Two times a day (BID) | ORAL | 0 refills | Status: DC | PRN

## 2020-09-23 MED ORDER — OXYCODONE-ACETAMINOPHEN 5-325 MG PO TABS: 1.0000 | ORAL_TABLET | Freq: Four times a day (QID) | ORAL | 0 refills | Status: DC | PRN

## 2020-09-25 ENCOUNTER — Telehealth: Payer: Self-pay | Admitting: *Deleted

## 2020-09-25 NOTE — Progress Notes (Signed)
Surgical Instructions    Your procedure is scheduled on May 9  Report to [facility] Main Entrance "A" at 1100 A.M., then check in with the Admitting office.  Call this number if you have problems the morning of surgery:  [phone]   If you have any questions prior to your surgery date call [phone]: Open Monday-Friday 8am-4pm    Remember:  Do not eat after midnight the night before your surgery  You may drink clear liquids until 1000 am the morning of your surgery.   Clear liquids allowed are: Water, Non-Citrus Juices (without pulp), Carbonated Beverages, Clear Tea, Black Coffee Only, and Gatorade  Please complete your PRE-SURGERY ENSURE that was provided to you by 1000 am the morning of surgery.  Please, if able, drink it in one setting. DO NOT SIP.     Take these medicines the morning of surgery with A SIP OF WATER rosuvastatin (CRESTOR)   Follow your surgeon's instructions on when to stop Aspirin.  If no instructions were given by your surgeon then you will need to call the office to get those instructions.    As of today, STOP taking any Aspirin (unless otherwise instructed by your surgeon) Aleve, Naproxen, Ibuprofen, Motrin, Advil, Goody's, BC's, all herbal medications, fish oil, and all vitamins. meloxicam (MOBIC)   WHAT DO I DO ABOUT MY DIABETES MEDICATION?   Marland Kitchen Do not take oral diabetes medicines (pills) the morning of surgery.  . THE NIGHT BEFORE SURGERY, take __11_________ units of _LEVEMIR FLEXTOUCH__________insulin.      . The day of surgery, do not take other diabetes injectables, including Byetta (exenatide), Bydureon (exenatide ER), Victoza (liraglutide), or Trulicity (dulaglutide).  . If your CBG is greater than 220 mg/dL, you may take  of your sliding scale (correction) dose of insulin.   HOW TO MANAGE YOUR DIABETES BEFORE AND AFTER SURGERY  Why is it important to control my blood sugar before and after surgery? . Improving blood sugar levels before  and after surgery helps healing and can limit problems. . A way of improving blood sugar control is eating a healthy diet by: o  Eating less sugar and carbohydrates o  Increasing activity/exercise o  Talking with your doctor about reaching your blood sugar goals . High blood sugars (greater than 180 mg/dL) can raise your risk of infections and slow your recovery, so you will need to focus on controlling your diabetes during the weeks before surgery. . Make sure that the doctor who takes care of your diabetes knows about your planned surgery including the date and location.  How do I manage my blood sugar before surgery? . Check your blood sugar at least 4 times a day, starting 2 days before surgery, to make sure that the level is not too high or low. . Check your blood sugar the morning of your surgery when you wake up and every 2 hours until you get to the Short Stay unit. o If your blood sugar is less than 70 mg/dL, you will need to treat for low blood sugar: - Do not take insulin. - Treat a low blood sugar (less than 70 mg/dL) with  cup of clear juice (cranberry or apple), 4 glucose tablets, OR glucose gel. - Recheck blood sugar in 15 minutes after treatment (to make sure it is greater than 70 mg/dL). If your blood sugar is not greater than 70 mg/dL on recheck, call [phone] for further instructions. . Report your blood sugar to the short stay nurse when  you get to Short Stay.  . If you are admitted to the hospital after surgery: o Your blood sugar will be checked by the staff and you will probably be given insulin after surgery (instead of oral diabetes medicines) to make sure you have good blood sugar levels. o The goal for blood sugar control after surgery is 80-180 mg/dL.                     Do not wear jewelry, make up, or nail polish            Do not wear lotions, powders, perfumes/colognes, or deodorant.            Do not shave 48 hours prior to surgery.  Men may shave face and  neck.            Do not bring valuables to the hospital.            [facility] is not responsible for any belongings or valuables.  Do NOT Smoke (Tobacco/Vaping) or drink Alcohol 24 hours prior to your procedure If you use a CPAP at night, you may bring all equipment for your overnight stay.   Contacts, glasses, dentures or bridgework may not be worn into surgery, please bring cases for these belongings   For patients admitted to the hospital, discharge time will be determined by your treatment team.   Patients discharged the day of surgery will not be allowed to drive home, and someone needs to stay with them for 24 hours.    Special instructions:   [place]- Preparing For Surgery  Before surgery, you can play an important role. Because skin is not sterile, your skin needs to be as free of germs as possible. You can reduce the number of germs on your skin by washing with CHG (chlorahexidine gluconate) Soap before surgery.  CHG is an antiseptic cleaner which kills germs and bonds with the skin to continue killing germs even after washing.    Oral Hygiene is also important to reduce your risk of infection.  Remember - BRUSH YOUR TEETH THE MORNING OF SURGERY WITH YOUR REGULAR TOOTHPASTE  Please do not use if you have an allergy to CHG or antibacterial soaps. If your skin becomes reddened/irritated stop using the CHG.  Do not shave (including legs and underarms) for at least 48 hours prior to first CHG shower. It is OK to shave your face.  Please follow these instructions carefully.   1. Shower the NIGHT BEFORE SURGERY and the MORNING OF SURGERY  2. If you chose to wash your hair, wash your hair first as usual with your normal shampoo.  3. After you shampoo, rinse your hair and body thoroughly to remove the shampoo.  4. Wash Face and genitals (private parts) with your normal soap.   5.  Shower the NIGHT BEFORE SURGERY and the MORNING OF SURGERY with CHG Soap.   6. Use CHG Soap  as you would any other liquid soap. You can apply CHG directly to the skin and wash gently with a scrungie or a clean washcloth.   7. Apply the CHG Soap to your body ONLY FROM THE NECK DOWN.  Do not use on open wounds or open sores. Avoid contact with your eyes, ears, mouth and genitals (private parts). Wash Face and genitals (private parts)  with your normal soap.   8. Wash thoroughly, paying special attention to the area where your surgery will be performed.  9.  Thoroughly rinse your body with warm water from the neck down.  10. DO NOT shower/wash with your normal soap after using and rinsing off the CHG Soap.  11. Pat yourself dry with a CLEAN TOWEL.  12. Wear CLEAN PAJAMAS to bed the night before surgery  13. Place CLEAN SHEETS on your bed the night before your surgery  14. DO NOT SLEEP WITH PETS.   Day of Surgery: Take a shower with CHG soap. Wear Clean/Comfortable clothing the morning of surgery Do not apply any deodorants/lotions.   Remember to brush your teeth WITH YOUR REGULAR TOOTHPASTE.   Please read over the following fact sheets that you were given.

## 2020-09-25 NOTE — Care Plan (Signed)
RNCM call to patient to discuss his Left total hip arthroplasty with Dr. Erlinda Hong scheduled for 09/27/20. He is an Ortho bundle patient through [facility] and is agreeable to case management. Plan is to discharge to his home and he has a fiance and daughter that will be assisting him at home. He has all DME from previous hip replacement surgery last year (FWW, 3in1/BSC). Discussed post-op care instructions and these will be mailed to him for his review. He states he does not feel he will need HHPT services as he is familiar with the hip exercises and had right hip replacement in July of 2021. Will continue to follow for needs.

## 2020-09-25 NOTE — Telephone Encounter (Signed)
Ortho bundle pre-op call completed. 

## 2020-09-26 ENCOUNTER — Encounter (HOSPITAL_COMMUNITY)
Admission: RE | Admit: 2020-09-26 | Discharge: 2020-09-26 | Disposition: A | Discharge status: Home / Self Care | Payer: Medicare Other | Source: Ambulatory Visit | Attending: Orthopaedic Surgery | Admitting: Orthopaedic Surgery

## 2020-09-26 ENCOUNTER — Ambulatory Visit (HOSPITAL_COMMUNITY)
Admission: RE | Admit: 2020-09-26 | Discharge: 2020-09-26 | Disposition: A | Discharge status: Home / Self Care | Payer: Medicare Other | Source: Ambulatory Visit | Attending: Physician Assistant | Admitting: Physician Assistant

## 2020-09-26 ENCOUNTER — Encounter (HOSPITAL_COMMUNITY): Payer: Self-pay

## 2020-09-26 ENCOUNTER — Other Ambulatory Visit (HOSPITAL_COMMUNITY): Payer: Medicare Other

## 2020-09-26 ENCOUNTER — Other Ambulatory Visit: Payer: Self-pay

## 2020-09-26 DIAGNOSIS — Z20822 Contact with and (suspected) exposure to covid-19: Secondary | ICD-10-CM | POA: Diagnosis not present

## 2020-09-26 DIAGNOSIS — M1612 Unilateral primary osteoarthritis, left hip: Secondary | ICD-10-CM | POA: Diagnosis not present

## 2020-09-26 DIAGNOSIS — Z96642 Presence of left artificial hip joint: Secondary | ICD-10-CM | POA: Diagnosis not present

## 2020-09-26 DIAGNOSIS — Z01818 Encounter for other preprocedural examination: Secondary | ICD-10-CM | POA: Diagnosis not present

## 2020-09-26 LAB — COMPREHENSIVE METABOLIC PANEL
ALT: 23 U/L (ref 0–44)
AST: 18 U/L (ref 15–41)
Albumin: 3.8 g/dL (ref 3.5–5.0)
Alkaline Phosphatase: 62 U/L (ref 38–126)
Anion gap: 9 (ref 5–15)
BUN: 15 mg/dL (ref 8–23)
CO2: 26 mmol/L (ref 22–32)
Calcium: 9.5 mg/dL (ref 8.9–10.3)
Chloride: 104 mmol/L (ref 98–111)
Creatinine, Ser: 1.18 mg/dL (ref 0.61–1.24)
GFR, Estimated: >60 mL/min (ref >60)
Glucose, Bld: 90 mg/dL (ref 70–99)
Potassium: 4.1 mmol/L (ref 3.5–5.1)
Sodium: 139 mmol/L (ref 135–145)
Total Bilirubin: 0.3 mg/dL (ref 0.3–1.2)
Total Protein: 8.1 g/dL (ref 6.5–8.1)

## 2020-09-26 LAB — CBC WITH DIFFERENTIAL/PLATELET
Abs Immature Granulocytes: 0.01 10*3/uL (ref 0.00–0.07)
Basophils Absolute: 0.1 10*3/uL (ref 0.0–0.1)
Basophils Relative: 2 %
Eosinophils Absolute: 0.2 10*3/uL (ref 0.0–0.5)
Eosinophils Relative: 3 %
HCT: 41.6 % (ref 39.0–52.0)
Hemoglobin: 13 g/dL (ref 13.0–17.0)
Immature Granulocytes: 0 %
Lymphocytes Relative: 35 %
Lymphs Abs: 2.2 10*3/uL (ref 0.7–4.0)
MCH: 27.8 pg (ref 26.0–34.0)
MCHC: 31.3 g/dL (ref 30.0–36.0)
MCV: 88.9 fL (ref 80.0–100.0)
Monocytes Absolute: 0.4 10*3/uL (ref 0.1–1.0)
Monocytes Relative: 7 %
Neutro Abs: 3.3 10*3/uL (ref 1.7–7.7)
Neutrophils Relative %: 53 %
Platelets: 280 10*3/uL (ref 150–400)
RBC: 4.68 MIL/uL (ref 4.22–5.81)
RDW: 14.3 % (ref 11.5–15.5)
WBC: 6.2 10*3/uL (ref 4.0–10.5)
nRBC: 0 % (ref 0.0–0.2)

## 2020-09-26 LAB — TYPE AND SCREEN
ABO/RH(D): O POS
Antibody Screen: NEGATIVE

## 2020-09-26 LAB — SARS CORONAVIRUS 2 (TAT 6-24 HRS): SARS Coronavirus 2: NEGATIVE

## 2020-09-26 LAB — URINALYSIS, ROUTINE W REFLEX MICROSCOPIC
Bacteria, UA: NONE SEEN
Bilirubin Urine: NEGATIVE
Glucose, UA: NEGATIVE mg/dL
Ketones, ur: NEGATIVE mg/dL
Leukocytes,Ua: NEGATIVE
Nitrite: NEGATIVE
Protein, ur: NEGATIVE mg/dL
Specific Gravity, Urine: 1.017 (ref 1.005–1.030)
pH: 5 (ref 5.0–8.0)

## 2020-09-26 LAB — PROTIME-INR
INR: 1 (ref 0.8–1.2)
Prothrombin Time: 13.2 seconds (ref 11.4–15.2)

## 2020-09-26 LAB — APTT: aPTT: 32 seconds (ref 24–36)

## 2020-09-26 LAB — SURGICAL PCR SCREEN
MRSA, PCR: NEGATIVE
Staphylococcus aureus: NEGATIVE

## 2020-09-26 LAB — GLUCOSE, CAPILLARY: Glucose-Capillary: 87 mg/dL (ref 70–99)

## 2020-09-26 NOTE — Progress Notes (Signed)
PCP - Cletus Gash T. Dennard Schaumann, MD Cardiologist - Denies  PPM/ICD - Denies  Chest x-ray - 09/26/20 EKG - 09/26/20 Stress Test - Per pt, > 5 years ago; done upon retiring from his job as part of his "exit". Per patient, results were negative. ECHO - Denies Cardiac Cath - Denies  Sleep Study - Denies  Fasting Blood Sugar 92-102 Checks Blood Sugar 2 times a day. CBG at PAT appointment was 22. Las A1C was 6.4 08/27/20  Blood Thinner Instructions: N/A Aspirin Instructions: 09/26/20  ERAS Protcol - Yes PRE-SURGERY Ensure or G2- Water given  COVID TEST- 09/26/53; results pending   Anesthesia review: No  Patient denies shortness of breath, fever, cough and chest pain at PAT appointment   All instructions explained to the patient, with a verbal understanding of the material. Patient agrees to go over the instructions while at home for a better understanding. Patient also instructed to self quarantine after being tested for COVID-19. The opportunity to ask questions was provided.

## 2020-09-26 NOTE — Progress Notes (Signed)
Surgical Instructions    Your procedure is scheduled on May 9  Report to [facility] Main Entrance "A" at 11:00 A.M., then check in with the Admitting office.  Call this number if you have problems the morning of surgery:  [phone]   If you have any questions prior to your surgery date call [phone]: Open Monday-Friday 8am-4pm    Remember:  Do not eat after midnight the night before your surgery  You may drink clear liquids until 10:00 am the morning of your surgery.   Clear liquids allowed are: Water, Non-Citrus Juices (without pulp), Carbonated Beverages, Clear Tea, Black Coffee Only, and Gatorade.  Please complete your PRE-SURGERY 10 oz Water that was provided to you by 10:00 am the morning of surgery.  Please, if able, drink it in one setting. DO NOT SIP.     Take these medicines the morning of surgery with A SIP OF WATER rosuvastatin (CRESTOR)   IF NEEDED: ondansetron (ZOFRAN)   Follow your surgeon's instructions on when to stop Aspirin.  If no instructions were given by your surgeon then you will need to call the office to get those instructions.    As of today, STOP taking any Aspirin (unless otherwise instructed by your surgeon) Aleve, Naproxen, Ibuprofen, Motrin, Advil, Goody's, BC's, all herbal medications, fish oil, and all vitamins. meloxicam (MOBIC).   WHAT DO I DO ABOUT MY DIABETES MEDICATION?  . THE NIGHT BEFORE SURGERY, take 11 units of LEVEMIR FLEXTOUCH insulin.      . The day of surgery, do not take other diabetes injectables, including Trulicity (dulaglutide).    HOW TO MANAGE YOUR DIABETES BEFORE AND AFTER SURGERY  Why is it important to control my blood sugar before and after surgery? . Improving blood sugar levels before and after surgery helps healing and can limit problems. . A way of improving blood sugar control is eating a healthy diet by: o  Eating less sugar and carbohydrates o  Increasing activity/exercise o  Talking with your doctor  about reaching your blood sugar goals . High blood sugars (greater than 180 mg/dL) can raise your risk of infections and slow your recovery, so you will need to focus on controlling your diabetes during the weeks before surgery. . Make sure that the doctor who takes care of your diabetes knows about your planned surgery including the date and location.  How do I manage my blood sugar before surgery? . Check your blood sugar at least 4 times a day, starting 2 days before surgery, to make sure that the level is not too high or low. . Check your blood sugar the morning of your surgery when you wake up and every 2 hours until you get to the Short Stay unit. o If your blood sugar is less than 70 mg/dL, you will need to treat for low blood sugar: - Do not take insulin. - Treat a low blood sugar (less than 70 mg/dL) with  cup of clear juice (cranberry or apple), 4 glucose tablets, OR glucose gel. - Recheck blood sugar in 15 minutes after treatment (to make sure it is greater than 70 mg/dL). If your blood sugar is not greater than 70 mg/dL on recheck, call [phone] for further instructions. . Report your blood sugar to the short stay nurse when you get to Short Stay.  . If you are admitted to the hospital after surgery: o Your blood sugar will be checked by the staff and you will probably be given insulin after  surgery (instead of oral diabetes medicines) to make sure you have good blood sugar levels. o The goal for blood sugar control after surgery is 80-180 mg/dL.            The Morning of Surgery:            Do not wear jewelry.            Do not wear lotions, powders, colognes, or deodorant.            Men may shave face and neck.            Do not bring valuables to the hospital.            [facility] is not responsible for any belongings or valuables.  Do NOT Smoke (Tobacco/Vaping) or drink Alcohol 24 hours prior to your procedure. If you use a CPAP at night, you may bring all  equipment for your overnight stay.   Contacts, glasses, dentures or bridgework may not be worn into surgery, please bring cases for these belongings   For patients admitted to the hospital, discharge time will be determined by your treatment team.   Patients discharged the day of surgery will not be allowed to drive home, and someone needs to stay with them for 24 hours.    Special instructions:   [place]- Preparing For Surgery  Before surgery, you can play an important role. Because skin is not sterile, your skin needs to be as free of germs as possible. You can reduce the number of germs on your skin by washing with CHG (chlorahexidine gluconate) Soap before surgery.  CHG is an antiseptic cleaner which kills germs and bonds with the skin to continue killing germs even after washing.    Oral Hygiene is also important to reduce your risk of infection.  Remember - BRUSH YOUR TEETH THE MORNING OF SURGERY WITH YOUR REGULAR TOOTHPASTE  Please do not use if you have an allergy to CHG or antibacterial soaps. If your skin becomes reddened/irritated stop using the CHG.  Do not shave (including legs and underarms) for at least 48 hours prior to first CHG shower. It is OK to shave your face.  Please follow these instructions carefully.   1. Shower the NIGHT BEFORE SURGERY and the MORNING OF SURGERY  2. If you chose to wash your hair, wash your hair first as usual with your normal shampoo.  3. After you shampoo, rinse your hair and body thoroughly to remove the shampoo.  4. Wash Face and genitals (private parts) with your normal soap.   5.  Shower the NIGHT BEFORE SURGERY and the MORNING OF SURGERY with CHG Soap.   6. Use CHG Soap as you would any other liquid soap. You can apply CHG directly to the skin and wash gently with a scrungie or a clean washcloth.   7. Apply the CHG Soap to your body ONLY FROM THE NECK DOWN.  Do not use on open wounds or open sores. Avoid contact with your eyes,  ears, mouth and genitals (private parts). Wash Face and genitals (private parts)  with your normal soap.   8. Wash thoroughly, paying special attention to the area where your surgery will be performed.  9. Thoroughly rinse your body with warm water from the neck down.  10. DO NOT shower/wash with your normal soap after using and rinsing off the CHG Soap.  11. Pat yourself dry with a CLEAN TOWEL.  12. Wear CLEAN PAJAMAS  to bed the night before surgery  13. Place CLEAN SHEETS on your bed the night before your surgery  14. DO NOT SLEEP WITH PETS.   Day of Surgery: Take a shower with CHG soap. Wear Clean/Comfortable clothing the morning of surgery Do not apply any deodorants/lotions.   Remember to brush your teeth WITH YOUR REGULAR TOOTHPASTE.   Please read over the following fact sheets that you were given.

## 2020-09-27 MED ORDER — TRANEXAMIC ACID 1000 MG/10ML IV SOLN
2000.0000 mg | INTRAVENOUS | Status: AC
  Administered 2020-09-30: 2000 mg via TOPICAL
  Filled 2020-09-27: qty 20

## 2020-09-29 NOTE — Anesthesia Preprocedure Evaluation (Addendum)
Anesthesia Evaluation  Patient identified by MRN, date of birth, ID band Patient awake    Reviewed: Allergy & Precautions, NPO status , Patient's Chart, lab work & pertinent test results  Airway Mallampati: II  TM Distance: >3 FB Neck ROM: Full    Dental no notable dental hx. (+) Teeth Intact, Dental Advisory Given, Partial Upper   Pulmonary neg pulmonary ROS,    Pulmonary exam normal breath sounds clear to auscultation       Cardiovascular negative cardio ROS Normal cardiovascular exam Rhythm:Regular Rate:Normal  09/26/2020 EKg SR R 72   Neuro/Psych negative neurological ROS  negative psych ROS   GI/Hepatic hiatal hernia, GERD  ,  Endo/Other  diabetes, Well Controlled, Type 2, Insulin Dependent  Renal/GU Renal InsufficiencyRenal disease     Musculoskeletal  (+) Arthritis ,   Abdominal (+) + obese,   Peds  Hematology hgb 13.0 plt 280 T&S available   Anesthesia Other Findings   Reproductive/Obstetrics                            Anesthesia Physical Anesthesia Plan  ASA: III  Anesthesia Plan: Spinal   Post-op Pain Management:    Induction:   PONV Risk Score and Plan: 2 and Treatment may vary due to age or medical condition and Ondansetron  Airway Management Planned: Nasal Cannula and Natural Airway  Additional Equipment: None  Intra-op Plan:   Post-operative Plan:   Informed Consent: I have reviewed the patients History and Physical, chart, labs and discussed the procedure including the risks, benefits and alternatives for the proposed anesthesia with the patient or authorized representative who has indicated his/her understanding and acceptance.     Dental advisory given  Plan Discussed with: CRNA  Anesthesia Plan Comments: (Sp)       Anesthesia Quick Evaluation

## 2020-09-30 ENCOUNTER — Other Ambulatory Visit: Payer: Self-pay

## 2020-09-30 ENCOUNTER — Encounter (HOSPITAL_COMMUNITY): Payer: Self-pay | Admitting: Orthopaedic Surgery

## 2020-09-30 ENCOUNTER — Ambulatory Visit (HOSPITAL_COMMUNITY): Payer: Medicare Other

## 2020-09-30 ENCOUNTER — Ambulatory Visit (HOSPITAL_COMMUNITY): Payer: Medicare Other | Admitting: Anesthesiology

## 2020-09-30 ENCOUNTER — Observation Stay (HOSPITAL_COMMUNITY)
Admission: RE | Admit: 2020-09-30 | Discharge: 2020-10-01 | Disposition: A | Discharge status: Home / Self Care | Payer: Medicare Other | Source: Ambulatory Visit | Attending: Orthopaedic Surgery | Admitting: Orthopaedic Surgery

## 2020-09-30 ENCOUNTER — Encounter (HOSPITAL_COMMUNITY)
Admission: RE | Disposition: A | Discharge status: Home / Self Care | Payer: Self-pay | Source: Ambulatory Visit | Attending: Orthopaedic Surgery

## 2020-09-30 DIAGNOSIS — Z96641 Presence of right artificial hip joint: Secondary | ICD-10-CM | POA: Insufficient documentation

## 2020-09-30 DIAGNOSIS — Z96642 Presence of left artificial hip joint: Secondary | ICD-10-CM | POA: Diagnosis not present

## 2020-09-30 DIAGNOSIS — Z7982 Long term (current) use of aspirin: Secondary | ICD-10-CM | POA: Insufficient documentation

## 2020-09-30 DIAGNOSIS — G9341 Metabolic encephalopathy: Secondary | ICD-10-CM | POA: Diagnosis not present

## 2020-09-30 DIAGNOSIS — Z96643 Presence of artificial hip joint, bilateral: Secondary | ICD-10-CM | POA: Diagnosis not present

## 2020-09-30 DIAGNOSIS — Z8546 Personal history of malignant neoplasm of prostate: Secondary | ICD-10-CM | POA: Diagnosis not present

## 2020-09-30 DIAGNOSIS — E119 Type 2 diabetes mellitus without complications: Secondary | ICD-10-CM | POA: Diagnosis not present

## 2020-09-30 DIAGNOSIS — E785 Hyperlipidemia, unspecified: Secondary | ICD-10-CM | POA: Diagnosis not present

## 2020-09-30 DIAGNOSIS — Z96649 Presence of unspecified artificial hip joint: Secondary | ICD-10-CM

## 2020-09-30 DIAGNOSIS — Z471 Aftercare following joint replacement surgery: Secondary | ICD-10-CM | POA: Diagnosis not present

## 2020-09-30 DIAGNOSIS — Z79899 Other long term (current) drug therapy: Secondary | ICD-10-CM | POA: Insufficient documentation

## 2020-09-30 DIAGNOSIS — Z794 Long term (current) use of insulin: Secondary | ICD-10-CM | POA: Diagnosis not present

## 2020-09-30 DIAGNOSIS — Z419 Encounter for procedure for purposes other than remedying health state, unspecified: Secondary | ICD-10-CM

## 2020-09-30 DIAGNOSIS — M1612 Unilateral primary osteoarthritis, left hip: Principal | ICD-10-CM

## 2020-09-30 DIAGNOSIS — E860 Dehydration: Secondary | ICD-10-CM | POA: Diagnosis not present

## 2020-09-30 HISTORY — PX: TOTAL HIP ARTHROPLASTY: SHX124

## 2020-09-30 LAB — GLUCOSE, CAPILLARY
Glucose-Capillary: 100 mg/dL — ABNORMAL HIGH (ref 70–99)
Glucose-Capillary: 74 mg/dL (ref 70–99)
Glucose-Capillary: 101 mg/dL — ABNORMAL HIGH (ref 70–99)
Glucose-Capillary: 167 mg/dL — ABNORMAL HIGH (ref 70–99)

## 2020-09-30 SURGERY — ARTHROPLASTY, HIP, TOTAL, ANTERIOR APPROACH
Anesthesia: Spinal | Site: Hip | Laterality: Left

## 2020-09-30 MED ORDER — SORBITOL 70 % SOLN: 30.0000 mL | Freq: Every day | Status: DC | PRN

## 2020-09-30 MED ORDER — ALUM & MAG HYDROXIDE-SIMETH 200-200-20 MG/5ML PO SUSP: 30.0000 mL | ORAL | Status: DC | PRN

## 2020-09-30 MED ORDER — ACETAMINOPHEN 325 MG PO TABS: 325.0000 mg | ORAL_TABLET | Freq: Four times a day (QID) | ORAL | Status: DC | PRN

## 2020-09-30 MED ORDER — TRANEXAMIC ACID-NACL 1000-0.7 MG/100ML-% IV SOLN
1000.0000 mg | Freq: Once | INTRAVENOUS | Status: AC
  Administered 2020-09-30: 1000 mg via INTRAVENOUS
  Filled 2020-09-30: qty 100

## 2020-09-30 MED ORDER — CHLORHEXIDINE GLUCONATE 0.12 % MT SOLN
OROMUCOSAL | Status: AC
  Administered 2020-09-30: 15 mL via OROMUCOSAL
  Filled 2020-09-30: qty 15

## 2020-09-30 MED ORDER — FENTANYL CITRATE (PF) 100 MCG/2ML IJ SOLN
INTRAMUSCULAR | Status: DC | PRN
  Administered 2020-09-30 (×3): 50 ug via INTRAVENOUS

## 2020-09-30 MED ORDER — FENTANYL CITRATE (PF) 100 MCG/2ML IJ SOLN: 25.0000 ug | INTRAMUSCULAR | Status: DC | PRN

## 2020-09-30 MED ORDER — PROPOFOL 500 MG/50ML IV EMUL
INTRAVENOUS | Status: DC | PRN
  Administered 2020-09-30: 75 ug/kg/min via INTRAVENOUS

## 2020-09-30 MED ORDER — INSULIN ASPART 100 UNIT/ML IJ SOLN: 0.0000 [IU] | Freq: Every day | INTRAMUSCULAR | Status: DC

## 2020-09-30 MED ORDER — METHOCARBAMOL 500 MG PO TABS
500.0000 mg | ORAL_TABLET | Freq: Four times a day (QID) | ORAL | Status: DC | PRN
  Administered 2020-09-30 – 2020-10-01 (×3): 500 mg via ORAL
  Filled 2020-09-30 (×3): qty 1

## 2020-09-30 MED ORDER — VANCOMYCIN HCL 1 G IV SOLR
INTRAVENOUS | Status: DC | PRN
  Administered 2020-09-30: 1000 mg via TOPICAL

## 2020-09-30 MED ORDER — ACETAMINOPHEN 10 MG/ML IV SOLN: 1000.0000 mg | Freq: Once | INTRAVENOUS | Status: DC | PRN

## 2020-09-30 MED ORDER — CEFAZOLIN SODIUM-DEXTROSE 2-4 GM/100ML-% IV SOLN
2.0000 g | Freq: Four times a day (QID) | INTRAVENOUS | Status: AC
  Administered 2020-09-30 (×2): 2 g via INTRAVENOUS
  Filled 2020-09-30 (×2): qty 100

## 2020-09-30 MED ORDER — DOCUSATE SODIUM 100 MG PO CAPS
100.0000 mg | ORAL_CAPSULE | Freq: Two times a day (BID) | ORAL | Status: DC
  Administered 2020-09-30 – 2020-10-01 (×2): 100 mg via ORAL
  Filled 2020-09-30 (×2): qty 1

## 2020-09-30 MED ORDER — ONDANSETRON HCL 4 MG PO TABS: 4.0000 mg | ORAL_TABLET | Freq: Four times a day (QID) | ORAL | Status: DC | PRN

## 2020-09-30 MED ORDER — LACTATED RINGERS IV SOLN: INTRAVENOUS | Status: DC

## 2020-09-30 MED ORDER — METOCLOPRAMIDE HCL 5 MG PO TABS
5.0000 mg | ORAL_TABLET | Freq: Three times a day (TID) | ORAL | Status: DC | PRN
Start: 2020-09-30 — End: 2020-10-01

## 2020-09-30 MED ORDER — CHLORHEXIDINE GLUCONATE 0.12 % MT SOLN: 15.0000 mL | OROMUCOSAL | Status: AC

## 2020-09-30 MED ORDER — OXYCODONE HCL 5 MG PO TABS
5.0000 mg | ORAL_TABLET | ORAL | Status: DC | PRN
  Administered 2020-09-30: 10 mg via ORAL
  Administered 2020-10-01: 5 mg via ORAL
  Administered 2020-10-01: 10 mg via ORAL
  Filled 2020-09-30: qty 2
  Filled 2020-09-30: qty 1

## 2020-09-30 MED ORDER — BUPIVACAINE-MELOXICAM ER 400-12 MG/14ML IJ SOLN
INTRAMUSCULAR | Status: AC
  Filled 2020-09-30: qty 1

## 2020-09-30 MED ORDER — SODIUM CHLORIDE 0.9 % IR SOLN
Status: DC | PRN
  Administered 2020-09-30: 3000 mL

## 2020-09-30 MED ORDER — INSULIN ASPART 100 UNIT/ML IJ SOLN
0.0000 [IU] | Freq: Three times a day (TID) | INTRAMUSCULAR | Status: DC
  Administered 2020-10-01: 4 [IU] via SUBCUTANEOUS

## 2020-09-30 MED ORDER — ONDANSETRON HCL 4 MG/2ML IJ SOLN
INTRAMUSCULAR | Status: DC | PRN
  Administered 2020-09-30: 4 mg via INTRAVENOUS

## 2020-09-30 MED ORDER — IRRISEPT - 450ML BOTTLE WITH 0.05% CHG IN STERILE WATER, USP 99.95% OPTIME
TOPICAL | Status: DC | PRN
  Administered 2020-09-30: 450 mL via TOPICAL

## 2020-09-30 MED ORDER — TRANEXAMIC ACID-NACL 1000-0.7 MG/100ML-% IV SOLN
1000.0000 mg | INTRAVENOUS | Status: AC
  Administered 2020-09-30: 1000 mg via INTRAVENOUS
  Filled 2020-09-30: qty 100

## 2020-09-30 MED ORDER — HYDROMORPHONE HCL 1 MG/ML IJ SOLN: 0.5000 mg | INTRAMUSCULAR | Status: DC | PRN

## 2020-09-30 MED ORDER — MAGNESIUM CITRATE PO SOLN: 1.0000 | Freq: Once | ORAL | Status: DC | PRN

## 2020-09-30 MED ORDER — POVIDONE-IODINE 10 % EX SWAB
2.0000 "application " | Freq: Once | CUTANEOUS | Status: AC
  Administered 2020-09-30: 2 via TOPICAL

## 2020-09-30 MED ORDER — 0.9 % SODIUM CHLORIDE (POUR BTL) OPTIME
TOPICAL | Status: DC | PRN
  Administered 2020-09-30: 1000 mL

## 2020-09-30 MED ORDER — DIPHENHYDRAMINE HCL 12.5 MG/5ML PO ELIX
25.0000 mg | ORAL_SOLUTION | ORAL | Status: DC | PRN
  Filled 2020-09-30: qty 10

## 2020-09-30 MED ORDER — SODIUM CHLORIDE 0.9 % IV SOLN: INTRAVENOUS | Status: DC

## 2020-09-30 MED ORDER — ONDANSETRON HCL 4 MG/2ML IJ SOLN: 4.0000 mg | Freq: Once | INTRAMUSCULAR | Status: DC | PRN

## 2020-09-30 MED ORDER — MIDAZOLAM HCL 2 MG/2ML IJ SOLN
INTRAMUSCULAR | Status: AC
  Filled 2020-09-30: qty 2

## 2020-09-30 MED ORDER — FENTANYL CITRATE (PF) 250 MCG/5ML IJ SOLN
INTRAMUSCULAR | Status: AC
  Filled 2020-09-30: qty 5

## 2020-09-30 MED ORDER — METHOCARBAMOL 1000 MG/10ML IJ SOLN
500.0000 mg | Freq: Four times a day (QID) | INTRAVENOUS | Status: DC | PRN
  Filled 2020-09-30 (×2): qty 5

## 2020-09-30 MED ORDER — POLYETHYLENE GLYCOL 3350 17 G PO PACK
17.0000 g | PACK | Freq: Every day | ORAL | Status: DC
  Administered 2020-10-01: 17 g via ORAL
  Filled 2020-09-30: qty 1

## 2020-09-30 MED ORDER — MIDAZOLAM HCL 5 MG/5ML IJ SOLN
INTRAMUSCULAR | Status: DC | PRN
  Administered 2020-09-30 (×2): 1 mg via INTRAVENOUS

## 2020-09-30 MED ORDER — METOCLOPRAMIDE HCL 5 MG/ML IJ SOLN: 5.0000 mg | Freq: Three times a day (TID) | INTRAMUSCULAR | Status: DC | PRN

## 2020-09-30 MED ORDER — OXYCODONE HCL 5 MG PO TABS
10.0000 mg | ORAL_TABLET | ORAL | Status: DC | PRN
  Filled 2020-09-30: qty 2

## 2020-09-30 MED ORDER — ASPIRIN 81 MG PO CHEW
81.0000 mg | CHEWABLE_TABLET | Freq: Two times a day (BID) | ORAL | Status: DC
  Administered 2020-09-30 – 2020-10-01 (×2): 81 mg via ORAL
  Filled 2020-09-30 (×2): qty 1

## 2020-09-30 MED ORDER — INSULIN DETEMIR 100 UNIT/ML ~~LOC~~ SOLN
22.0000 [IU] | Freq: Every day | SUBCUTANEOUS | Status: DC
  Administered 2020-09-30: 22 [IU] via SUBCUTANEOUS
  Filled 2020-09-30 (×2): qty 0.22

## 2020-09-30 MED ORDER — MENTHOL 3 MG MT LOZG: 1.0000 | LOZENGE | OROMUCOSAL | Status: DC | PRN

## 2020-09-30 MED ORDER — VANCOMYCIN HCL 1000 MG IV SOLR
INTRAVENOUS | Status: AC
  Filled 2020-09-30: qty 1000

## 2020-09-30 MED ORDER — ONDANSETRON HCL 4 MG/2ML IJ SOLN: 4.0000 mg | Freq: Four times a day (QID) | INTRAMUSCULAR | Status: DC | PRN

## 2020-09-30 MED ORDER — ACETAMINOPHEN 500 MG PO TABS
1000.0000 mg | ORAL_TABLET | Freq: Four times a day (QID) | ORAL | Status: DC
  Administered 2020-09-30 – 2020-10-01 (×3): 1000 mg via ORAL
  Filled 2020-09-30 (×3): qty 2

## 2020-09-30 MED ORDER — DEXAMETHASONE SODIUM PHOSPHATE 10 MG/ML IJ SOLN: 10.0000 mg | Freq: Once | INTRAMUSCULAR | Status: DC

## 2020-09-30 MED ORDER — OXYCODONE HCL ER 10 MG PO T12A
10.0000 mg | EXTENDED_RELEASE_TABLET | Freq: Two times a day (BID) | ORAL | Status: DC
  Administered 2020-09-30 – 2020-10-01 (×2): 10 mg via ORAL
  Filled 2020-09-30 (×2): qty 1

## 2020-09-30 MED ORDER — ONDANSETRON HCL 4 MG/2ML IJ SOLN
INTRAMUSCULAR | Status: AC
  Filled 2020-09-30: qty 2

## 2020-09-30 MED ORDER — CEFAZOLIN SODIUM-DEXTROSE 2-4 GM/100ML-% IV SOLN
2.0000 g | INTRAVENOUS | Status: AC
  Administered 2020-09-30: 2 g via INTRAVENOUS
  Filled 2020-09-30: qty 100

## 2020-09-30 MED ORDER — PHENOL 1.4 % MT LIQD: 1.0000 | OROMUCOSAL | Status: DC | PRN

## 2020-09-30 MED ORDER — BUPIVACAINE-MELOXICAM ER 400-12 MG/14ML IJ SOLN
INTRAMUSCULAR | Status: DC | PRN
  Administered 2020-09-30: 400 mg

## 2020-09-30 SURGICAL SUPPLY — 65 items
BAG DECANTER FOR FLEXI CONT (MISCELLANEOUS) ×2 IMPLANT
CELLS DAT CNTRL 66122 CELL SVR (MISCELLANEOUS) IMPLANT
COVER PERINEAL POST (MISCELLANEOUS) ×2 IMPLANT
COVER SURGICAL LIGHT HANDLE (MISCELLANEOUS) ×2 IMPLANT
COVER WAND RF STERILE (DRAPES) ×2 IMPLANT
CUP ACET PINNACLE SECTR 60MM (Hips) ×1 IMPLANT
DERMABOND ADHESIVE PROPEN (GAUZE/BANDAGES/DRESSINGS) ×1
DERMABOND ADVANCED .7 DNX6 (GAUZE/BANDAGES/DRESSINGS) ×1 IMPLANT
DRAPE C-ARM 42X72 X-RAY (DRAPES) ×2 IMPLANT
DRAPE POUCH INSTRU U-SHP 10X18 (DRAPES) ×2 IMPLANT
DRAPE STERI IOBAN 125X83 (DRAPES) ×2 IMPLANT
DRAPE U-SHAPE 47X51 STRL (DRAPES) ×4 IMPLANT
DRSG AQUACEL AG ADV 3.5X10 (GAUZE/BANDAGES/DRESSINGS) ×2 IMPLANT
DURAPREP 26ML APPLICATOR (WOUND CARE) ×4 IMPLANT
ELECT BLADE 4.0 EZ CLEAN MEGAD (MISCELLANEOUS) ×2
ELECT REM PT RETURN 9FT ADLT (ELECTROSURGICAL) ×2
ELECTRODE BLDE 4.0 EZ CLN MEGD (MISCELLANEOUS) ×1 IMPLANT
ELECTRODE REM PT RTRN 9FT ADLT (ELECTROSURGICAL) ×1 IMPLANT
FEM STEM 12/14 TAPER SZ 4 HIP (Orthopedic Implant) ×2 IMPLANT
FEMORAL STEM 12/14 TPR SZ4 HIP (Orthopedic Implant) ×1 IMPLANT
GLOVE ECLIPSE 7.0 STRL STRAW (GLOVE) ×4 IMPLANT
GLOVE SKINSENSE NS SZ7.5 (GLOVE) ×1
GLOVE SKINSENSE STRL SZ7.5 (GLOVE) ×1 IMPLANT
GLOVE SURG SYN 7.5  E (GLOVE) ×4
GLOVE SURG SYN 7.5 E (GLOVE) ×4 IMPLANT
GLOVE SURG UNDER POLY LF SZ7 (GLOVE) ×2 IMPLANT
GOWN STRL REIN XL XLG (GOWN DISPOSABLE) ×2 IMPLANT
GOWN STRL REUS W/ TWL LRG LVL3 (GOWN DISPOSABLE) IMPLANT
GOWN STRL REUS W/ TWL XL LVL3 (GOWN DISPOSABLE) ×1 IMPLANT
GOWN STRL REUS W/TWL LRG LVL3 (GOWN DISPOSABLE)
GOWN STRL REUS W/TWL XL LVL3 (GOWN DISPOSABLE) ×1
HANDPIECE INTERPULSE COAX TIP (DISPOSABLE) ×1
HEAD CERAMIC DELTA 36 PLUS 1.5 (Hips) ×2 IMPLANT
HOOD PEEL AWAY FLYTE STAYCOOL (MISCELLANEOUS) ×4 IMPLANT
IV NS IRRIG 3000ML ARTHROMATIC (IV SOLUTION) ×2 IMPLANT
JET LAVAGE IRRISEPT WOUND (IRRIGATION / IRRIGATOR) ×2
KIT BASIN OR (CUSTOM PROCEDURE TRAY) ×2 IMPLANT
LAVAGE JET IRRISEPT WOUND (IRRIGATION / IRRIGATOR) ×1 IMPLANT
LINER NEUTRAL 58X36MM PLUS4 ×2 IMPLANT
MARKER SKIN DUAL TIP RULER LAB (MISCELLANEOUS) ×2 IMPLANT
NEEDLE SPNL 18GX3.5 QUINCKE PK (NEEDLE) ×2 IMPLANT
PACK TOTAL JOINT (CUSTOM PROCEDURE TRAY) ×2 IMPLANT
PACK UNIVERSAL I (CUSTOM PROCEDURE TRAY) ×2 IMPLANT
PINNSECTOR W/GRIP ACE CUP 60MM (Hips) ×2 IMPLANT
RTRCTR WOUND ALEXIS 18CM MED (MISCELLANEOUS)
SAW OSC TIP CART 19.5X105X1.3 (SAW) ×2 IMPLANT
SCREW 6.5MMX25MM (Screw) ×2 IMPLANT
SET HNDPC FAN SPRY TIP SCT (DISPOSABLE) ×1 IMPLANT
STAPLER VISISTAT 35W (STAPLE) IMPLANT
SUT ETHIBOND 2 V 37 (SUTURE) ×2 IMPLANT
SUT ETHILON 2 0 FS 18 (SUTURE) ×8 IMPLANT
SUT VIC AB 0 CT1 27 (SUTURE) ×1
SUT VIC AB 0 CT1 27XBRD ANBCTR (SUTURE) ×1 IMPLANT
SUT VIC AB 1 CTX 36 (SUTURE) ×1
SUT VIC AB 1 CTX36XBRD ANBCTR (SUTURE) ×1 IMPLANT
SUT VIC AB 2-0 CT1 27 (SUTURE) ×2
SUT VIC AB 2-0 CT1 TAPERPNT 27 (SUTURE) ×2 IMPLANT
SYR 50ML LL SCALE MARK (SYRINGE) ×2 IMPLANT
TOWEL GREEN STERILE (TOWEL DISPOSABLE) ×2 IMPLANT
TRAY CATH 16FR W/PLASTIC CATH (SET/KITS/TRAYS/PACK) IMPLANT
TRAY FOL W/BAG SLVR 16FR STRL (SET/KITS/TRAYS/PACK) ×1 IMPLANT
TRAY FOLEY W/BAG SLVR 16FR (SET/KITS/TRAYS/PACK)
TRAY FOLEY W/BAG SLVR 16FR LF (SET/KITS/TRAYS/PACK) ×2
TRAY FOLEY W/BAG SLVR 16FR ST (SET/KITS/TRAYS/PACK) IMPLANT
YANKAUER SUCT BULB TIP NO VENT (SUCTIONS) ×2 IMPLANT

## 2020-09-30 NOTE — H&P (Signed)
PREOPERATIVE H&P  Chief Complaint: left hip degenerative joing disease  HPI: Curtis Hunt is a 70 y.o. male who presents for surgical treatment of left hip degenerative joing disease.  He denies any changes in medical history.  Past Medical History:  Diagnosis Date  . Arthritis   . Cancer [facility]) 2004   Prostate CA with surgery  . Diabetes mellitus without complication ([place])   . Gout   . Hyperlipidemia   . Prostate cancer [facility])    Past Surgical History:  Procedure Laterality Date  . JOINT REPLACEMENT Right 12/18/2019   total hip replacement w/ Dr. Erlinda Hong  . PROSTATE SURGERY    . TOTAL HIP ARTHROPLASTY Right 12/18/2019   Procedure: RIGHT TOTAL HIP ARTHROPLASTY ANTERIOR APPROACH;  Surgeon: Leandrew Koyanagi, MD;  Location: [place];  Service: Orthopedics;  Laterality: Right;   Social History   Socioeconomic History  . Marital status: Widowed    Spouse name: Not on file  . Number of children: Not on file  . Years of education: Not on file  . Highest education level: Not on file  Occupational History  . Not on file  Tobacco Use  . Smoking status: Never Smoker  . Smokeless tobacco: Never Used  Vaping Use  . Vaping Use: Never used  Substance and Sexual Activity  . Alcohol use: Yes    Comment: Beer prior to diagnosis of diabetes, about once a week during sports on tv  . Drug use: No  . Sexual activity: Not Currently  Other Topics Concern  . Not on file  Social History Narrative  . Not on file   Social Determinants of Health   Financial Resource Strain: Low Risk   . Difficulty of Paying Living Expenses: Not hard at all  Food Insecurity: Not on file  Transportation Needs: Not on file  Physical Activity: Not on file  Stress: Not on file  Social Connections: Not on file   Family History  Problem Relation Age of Onset  . Cancer Brother        prostate  . Cancer Mother        breast ca   No Known Allergies Prior to Admission medications   Medication Sig Start Date  End Date Taking? Authorizing Provider  aspirin EC 81 MG tablet Take 1 tablet (81 mg total) by mouth 2 (two) times daily. To be taken after surgery 09/23/20   Aundra Dubin, PA-C  ASPIRIN LOW DOSE 81 MG EC tablet TAKE 1 TABLET BY MOUTH TWICE A DAY Patient taking differently: Take 81 mg by mouth daily. 01/22/20  Yes Aundra Dubin, PA-C  docusate sodium (COLACE) 100 MG capsule Take 1 capsule (100 mg total) by mouth daily as needed. 09/23/20 09/23/21  Aundra Dubin, PA-C  LEVEMIR FLEXTOUCH 100 UNIT/ML FlexPen INJECT 25 UNITS INTO THE SKIN DAILY AT 10 PM. Patient taking differently: Inject 22 Units into the skin daily at 10 pm. 06/24/20  Yes Pickard, [name], MD  methocarbamol (ROBAXIN) 500 MG tablet Take 1 tablet (500 mg total) by mouth 2 (two) times daily as needed. To be taken after surgery 09/23/20   Aundra Dubin, PA-C  ondansetron (ZOFRAN) 4 MG tablet Take 1 tablet (4 mg total) by mouth every 8 (eight) hours as needed for nausea or vomiting. 09/23/20   Aundra Dubin, PA-C  oxyCODONE-acetaminophen (PERCOCET) 5-325 MG tablet Take 1-2 tablets by mouth every 6 (six) hours as needed. To be taken after surgery 09/23/20  Aundra Dubin, PA-C  rosuvastatin (CRESTOR) 40 MG tablet TAKE 1 TABLET BY MOUTH EVERY DAY Patient taking differently: Take 40 mg by mouth daily. 07/10/20  Yes Susy Frizzle, MD  sulfamethoxazole-trimethoprim (BACTRIM DS) 800-160 MG tablet Take 1 tablet by mouth 2 (two) times daily. To be taken after surgery 09/23/20   Aundra Dubin, PA-C  TRULICITY 1.5 FQ/7.2UV SOPN INJECT 1.5 MG AS DIRECTED EVERY TUESDAY. Patient taking differently: Inject 1.5 mg as directed every Saturday. 07/09/20  Yes PickardCammie Mcgee, MD  VIAGRA 100 MG tablet TAKE 0.5-1 TABLETS (50-100 MG TOTAL) BY MOUTH DAILY AS NEEDED FOR ERECTILE DYSFUNCTION. 08/14/16  Yes Susy Frizzle, MD  allopurinol (ZYLOPRIM) 100 MG tablet Take 3 tablets (300 mg total) by mouth daily. Patient not taking: Reported on 09/18/2020  08/05/18   Susy Frizzle, MD  B-D UF III MINI PEN NEEDLES 31G X 5 MM MISC USE AS DIRECTED TWICE A DAY 09/28/19   Susy Frizzle, MD  Blood Glucose Monitoring Suppl (ONE TOUCH ULTRA 2) w/Device KIT ONE TOUCH ULTRA 2 GLUCOSE SYSTEM W/ DEVICE KIT 06/12/20   Susy Frizzle, MD  glucose blood (ONE TOUCH ULTRA TEST) test strip CHECK SUGAR 4-5 TIMES A DAY.EVERY MORNING AFTER MEALS AND AT BEDTIME 11/07/14   Susy Frizzle, MD  Insulin Pen Needle 31G X 6 MM MISC Use with Levemir Flextouch Pen 06/15/13   Samella Parr, NP  meloxicam (MOBIC) 7.5 MG tablet Take 1 tablet (7.5 mg total) by mouth 2 (two) times daily as needed for pain. Patient not taking: Reported on 09/18/2020 09/08/19   Leandrew Koyanagi, MD  [facility] ULTRA test strip CHECK SUGAR 4 TO 5 TIMES DAILY EVERY MORNING, AFTER MEALS, AND AT BEDTIME E11.65 12/04/19   Susy Frizzle, MD     Positive ROS: All other systems have been reviewed and were otherwise negative with the exception of those mentioned in the HPI and as above.  Physical Exam: General: Alert, no acute distress Cardiovascular: No pedal edema Respiratory: No cyanosis, no use of accessory musculature GI: abdomen soft Skin: No lesions in the area of chief complaint Neurologic: Sensation intact distally Psychiatric: Patient is competent for consent with normal mood and affect Lymphatic: no lymphedema  MUSCULOSKELETAL: exam stable  Assessment: left hip degenerative joing disease  Plan: Plan for Procedure(s): LEFT TOTAL HIP ARTHROPLASTY ANTERIOR APPROACH  The risks benefits and alternatives were discussed with the patient including but not limited to the risks of nonoperative treatment, versus surgical intervention including infection, bleeding, nerve injury,  blood clots, cardiopulmonary complications, morbidity, mortality, among others, and they were willing to proceed.   Preoperative templating of the joint replacement has been completed, documented, and submitted to  the Operating Room personnel in order to optimize intra-operative equipment management.   Eduard Roux, MD 09/30/2020 9:29 AM

## 2020-09-30 NOTE — Evaluation (Signed)
Physical Therapy Evaluation Patient Details Name: Curtis Hunt MRN: [id number] DOB: 1950/12/21 Today's Date: 09/30/2020   History of Present Illness  Pt adm 09/30/20 for lt THR direct anterior approach. PMH - arthritis, gout, DM, prostate CA, rt THR  Clinical Impression  Pt with expected decr in mobility after lt THR. Pt tolerated ambulation in hallway and expect he will continue to make good progress. He plans to return home with friends to assist as needed.     Follow Up Recommendations Follow surgeon's recommendation for DC plan and follow-up therapies    Equipment Recommendations  None recommended by PT    Recommendations for Other Services       Precautions / Restrictions Precautions Precautions: None Restrictions Weight Bearing Restrictions: Yes LLE Weight Bearing: Weight bearing as tolerated      Mobility  Bed Mobility Overal bed mobility: Needs Assistance Bed Mobility: Supine to Sit     Supine to sit: Min assist     General bed mobility comments: Assist to bring LLE off of bed    Transfers Overall transfer level: Needs assistance Equipment used: Rolling walker (2 wheeled) Transfers: Sit to/from Stand Sit to Stand: Min guard         General transfer comment: Assist for safety  Ambulation/Gait Ambulation/Gait assistance: Min guard Gait Distance (Feet): 200 Feet Assistive device: Rolling walker (2 wheeled) Gait Pattern/deviations: Step-through pattern;Decreased step length - right;Decreased stance time - left;Antalgic Gait velocity: decr Gait velocity interpretation: <1.31 ft/sec, indicative of household ambulator General Gait Details: Assist for safety. verbal cues to stay closer to the walker when turning  Stairs            Wheelchair Mobility    Modified Rankin (Stroke Patients Only)       Balance Overall balance assessment: No apparent balance deficits (not formally assessed)                                            Pertinent Vitals/Pain Pain Assessment: 0-10 Pain Score: 5  Pain Location: lt hip Pain Descriptors / Indicators: Aching;Sore;Tightness Pain Intervention(s): Monitored during session;Repositioned;Patient requesting pain meds-RN notified;Ice applied    Home Living Family/patient expects to be discharged to:: Private residence Living Arrangements: Alone Available Help at Discharge: Friend(s);Available PRN/intermittently Type of Home: House Home Access: Stairs to enter Entrance Stairs-Rails: Right Entrance Stairs-Number of Steps: 2 Home Layout: One level Home Equipment: Walker - 2 wheels;Bedside commode;Cane - single point      Prior Function Level of Independence: Independent         Comments: 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     Hand Dominance        Extremity/Trunk Assessment   Upper Extremity Assessment Upper Extremity Assessment: Overall WFL for tasks assessed    Lower Extremity Assessment Lower Extremity Assessment: LLE deficits/detail LLE Deficits / Details: limited by expected post op pain       Communication   Communication: No difficulties  Cognition Arousal/Alertness: Awake/alert Behavior During Therapy: WFL for tasks assessed/performed Overall Cognitive Status: Within Functional Limits for tasks assessed                                        General Comments      Exercises Total Joint Exercises Ankle Circles/Pumps:  AROM;Both;5 reps;Seated Quad Sets: AROM;Left;5 reps;Seated Long Arc Quad: AROM;Left;5 reps;Seated   Assessment/Plan    PT Assessment Patient needs continued PT services  PT Problem List Decreased strength;Decreased mobility;Pain       PT Treatment Interventions DME instruction;Gait training;Stair training;Functional mobility training;Therapeutic activities;Therapeutic exercise;Patient/family education    PT Goals (Current goals can be found in the Care Plan section)  Acute Rehab PT  Goals Patient Stated Goal: return home PT Goal Formulation: With patient Time For Goal Achievement: 10/04/20 Potential to Achieve Goals: Good    Frequency 7X/week   Barriers to discharge        Co-evaluation               AM-PAC PT "6 Clicks" Mobility  Outcome Measure Help needed turning from your back to your side while in a flat bed without using bedrails?: A Little Help needed moving from lying on your back to sitting on the side of a flat bed without using bedrails?: A Little Help needed moving to and from a bed to a chair (including a wheelchair)?: A Little Help needed standing up from a chair using your arms (e.g., wheelchair or bedside chair)?: A Little Help needed to walk in hospital room?: A Little Help needed climbing 3-5 steps with a railing? : A Little 6 Click Score: 18    End of Session Equipment Utilized During Treatment: Gait belt Activity Tolerance: Patient tolerated treatment well Patient left: in chair;with call bell/phone within reach Nurse Communication: Mobility status PT Visit Diagnosis: Other abnormalities of gait and mobility (R26.89);Pain Pain - Right/Left: Left Pain - part of body: Hip    Time: 7858-8502 PT Time Calculation (min) (ACUTE ONLY): 19 min   Charges:   PT Evaluation $PT Eval Moderate Complexity: [place] Pager [phone] Office [place] 09/30/2020, 5:52 PM

## 2020-09-30 NOTE — Transfer of Care (Signed)
Immediate Anesthesia Transfer of Care Note  Patient: Curtis Hunt  Procedure(s) Performed: LEFT TOTAL HIP ARTHROPLASTY ANTERIOR APPROACH (Left Hip)  Patient Location: PACU  Anesthesia Type:MAC and Spinal  Level of Consciousness: drowsy  Airway & Oxygen Therapy: Patient Spontanous Breathing  Post-op Assessment: Report given to RN and Post -op Vital signs reviewed and stable  Post vital signs: Reviewed and stable  Last Vitals:  Vitals Value Taken Time  BP 108/77 09/30/20 1329  Temp    Pulse 79 09/30/20 1330  Resp 14 09/30/20 1330  SpO2 97 % 09/30/20 1330  Vitals shown include unvalidated device data.  Last Pain:  Vitals:   09/30/20 1003  TempSrc:   PainSc: 0-No pain      Patients Stated Pain Goal: 3 (11/17/92 8546)  Complications: No complications documented.

## 2020-09-30 NOTE — Op Note (Signed)
LEFT TOTAL HIP ARTHROPLASTY ANTERIOR APPROACH  Procedure Note Curtis Hunt   [id number]  Pre-op Diagnosis: left hip degenerative joing disease     Post-op Diagnosis: same   Operative Procedures  1. Total hip replacement; Left hip; uncemented [id number]   Surgeon: Frankey Shown, M.D.  Assist: Madalyn Rob, PA-C   Anesthesia: spinal  Prosthesis: Depuy Acetabulum: Pinnacle 60 mm Femur: Actis 4 HO Head: 5 m size: +1.5 Liner: +4 Bearing Type: ceramic/poly  Total Hip Arthroplasty (Anterior Approach) Op Note:  After informed consent was obtained and the operative extremity marked in the holding area, the patient was brought back to the operating room and placed supine on the HANA table. Next, the operative extremity was prepped and draped in normal sterile fashion. Surgical timeout occurred verifying patient identification, surgical site, surgical procedure and administration of antibiotics.  A modified anterior Smith-Peterson approach to the hip was performed, using the interval between tensor fascia lata and sartorius.  Dissection was carried bluntly down onto the anterior hip capsule. The lateral femoral circumflex vessels were identified and coagulated. A capsulotomy was performed and the capsular flaps tagged for later repair.  The neck osteotomy was performed. The femoral head was removed which showed severe degenerative wear, the acetabular rim was cleared of soft tissue and attention was turned to reaming the acetabulum.  Sequential reaming was performed under fluoroscopic guidance. We reamed to a size 59 mm, and then impacted the acetabular shell. A 25 mm cancellous screw was placed through the shell for added fixation.  The liner was then placed after irrigation and attention turned to the femur.  After placing the femoral hook, the leg was taken to externally rotated, extended and adducted position taking care to perform soft tissue releases to allow for adequate  mobilization of the femur. Soft tissue was cleared from the shoulder of the greater trochanter and the hook elevator used to improve exposure of the proximal femur. Sequential broaching performed up to a size 4. The cancellous bone was very dense and hard.  Trial neck and head were placed. The leg was brought back up to neutral and the construct reduced.  Antibiotic irrigation was placed in the surgical wound and kept for at least 1 minute.  The position and sizing of components, offset and leg lengths were checked using fluoroscopy. Stability of the construct was checked in extension and external rotation without any subluxation or impingement of prosthesis. We dislocated the prosthesis, dropped the leg back into position, removed trial components, and irrigated copiously. The final stem and head was then placed, the leg brought back up, the system reduced and fluoroscopy used to verify positioning.  We irrigated, obtained hemostasis and closed the capsule using #2 ethibond suture.  One gram of vancomycin powder was placed in the surgical bed.   One gram of topical tranexamic acid was injected into the joint.  The fascia was closed with #1 vicryl plus, the deep fat layer was closed with 0 vicryl, the subcutaneous layers closed with 2.0 Vicryl Plus and the skin closed with 2.0 nylon and dermabond. A sterile dressing was applied. The patient was awakened in the operating room and taken to recovery in stable condition.  All sponge, needle, and instrument counts were correct at the end of the case.   Tawanna Cooler, my PA, was a medical necessity for opening, closing, limb positioning, retracting, exposing, and overall facilitation and timely completion of the surgery.  Position: supine  Complications: see description of procedure.  Time Out: performed   Drains/Packing: none  Estimated blood loss: see anesthesia record  Returned to Recovery Room: in good condition.   Antibiotics: yes   Mechanical  VTE (DVT) Prophylaxis: sequential compression devices, TED thigh-high  Chemical VTE (DVT) Prophylaxis: aspirin   Fluid Replacement: see anesthesia record  Specimens Removed: 1 to pathology   Sponge and Instrument Count Correct? yes   PACU: portable radiograph - low AP   Plan/RTC: Return in 2 weeks for staple removal. Weight Bearing/Load Lower Extremity: full  Hip precautions: none Suture Removal: 2 weeks   N. Eduard Roux, MD [facility] 12:59 PM   Implant Name Type Inv. Item Serial No. Manufacturer Lot No. LRB No. Used Action  HEAD CERAMIC DELTA 36 PLUS 1.5 - [id number] Hips HEAD CERAMIC DELTA 36 PLUS 1.5  [employer] [id number] Left 1 Implanted  FEM STEM 12/14 TAPER SZ 4 HIP - [id number] Orthopedic Implant FEM STEM 12/14 TAPER SZ 4 HIP  [employer]PU[employer] ORTH[employer] [id number] Left 1 Implanted  LINER NEUTRAL 58X36MM PLUS4 - [id number]  LINER NEUTRAL 58X36MM PLUS4  DEPU[employer] ORTH[employer] 01/22/2025 Left 1 Implanted  PINNSECTOR W/GRIP ACE CUP 60MM - [id number] Hips PINNSECTOR W/GRIP ACE CUP 60MM  [employer]PU[employer]TH[employer] [id number] Left 1 Implanted  SCREW 6.5MMX25MM - [id number] Screw SCREW 6.5MMX25MM  [employer]PU[employer] ORTH[employer] [id number] Left 1 Implanted

## 2020-09-30 NOTE — Anesthesia Procedure Notes (Addendum)
Spinal  Patient location during procedure: OB Start time: 09/30/2020 11:05 AM End time: 09/30/2020 11:11 AM Reason for block: surgical anesthesia Staffing Performed: anesthesiologist  Anesthesiologist: Barnet Glasgow, MD Preanesthetic Checklist Completed: patient identified, IV checked, risks and benefits discussed, surgical consent, monitors and equipment checked, pre-op evaluation and timeout performed Spinal Block Patient position: sitting Prep: DuraPrep and site prepped and draped Patient monitoring: heart rate, cardiac monitor, continuous pulse ox and blood pressure Approach: midline Location: L3-4 Injection technique: single-shot Needle Needle type: Pencan  Needle gauge: 24 G Needle length: 10 cm Needle insertion depth: 7 cm Assessment Sensory level: T4 Events: CSF return Additional Notes 1 Attempt (s). Pt tolerated procedure well.

## 2020-09-30 NOTE — Anesthesia Procedure Notes (Signed)
Procedure Name: MAC Performed by: Candis Shine, CRNA Pre-anesthesia Checklist: Patient identified, Emergency Drugs available, Suction available, Patient being monitored and Timeout performed Patient Re-evaluated:Patient Re-evaluated prior to induction Oxygen Delivery Method: Simple face mask

## 2020-09-30 NOTE — Anesthesia Postprocedure Evaluation (Signed)
Anesthesia Post Note  Patient: Curtis Hunt  Procedure(s) Performed: LEFT TOTAL HIP ARTHROPLASTY ANTERIOR APPROACH (Left Hip)     Patient location during evaluation: Nursing Unit Anesthesia Type: Spinal Level of consciousness: oriented and awake and alert Pain management: pain level controlled Vital Signs Assessment: post-procedure vital signs reviewed and stable Respiratory status: spontaneous breathing and respiratory function stable Cardiovascular status: blood pressure returned to baseline and stable Postop Assessment: no headache, no backache, no apparent nausea or vomiting and patient able to bend at knees Anesthetic complications: no   No complications documented.  Last Vitals:  Vitals:   09/30/20 1500 09/30/20 1547  BP: 119/77 (!) 142/94  Pulse: 86 77  Resp: 17 18  Temp: 36.7 C 36.6 C  SpO2: 97% 98%    Last Pain:  Vitals:   09/30/20 1547  TempSrc: Oral  PainSc:                  Barnet Glasgow

## 2020-10-01 ENCOUNTER — Encounter (HOSPITAL_COMMUNITY): Payer: Self-pay | Admitting: Orthopaedic Surgery

## 2020-10-01 DIAGNOSIS — Z96641 Presence of right artificial hip joint: Secondary | ICD-10-CM | POA: Diagnosis not present

## 2020-10-01 DIAGNOSIS — Z79899 Other long term (current) drug therapy: Secondary | ICD-10-CM | POA: Diagnosis not present

## 2020-10-01 DIAGNOSIS — E119 Type 2 diabetes mellitus without complications: Secondary | ICD-10-CM | POA: Diagnosis not present

## 2020-10-01 DIAGNOSIS — Z794 Long term (current) use of insulin: Secondary | ICD-10-CM | POA: Diagnosis not present

## 2020-10-01 DIAGNOSIS — M1612 Unilateral primary osteoarthritis, left hip: Secondary | ICD-10-CM | POA: Diagnosis not present

## 2020-10-01 DIAGNOSIS — Z7982 Long term (current) use of aspirin: Secondary | ICD-10-CM | POA: Diagnosis not present

## 2020-10-01 LAB — BASIC METABOLIC PANEL
Anion gap: 7 (ref 5–15)
BUN: 16 mg/dL (ref 8–23)
CO2: 26 mmol/L (ref 22–32)
Calcium: 8.9 mg/dL (ref 8.9–10.3)
Chloride: 101 mmol/L (ref 98–111)
Creatinine, Ser: 1.18 mg/dL (ref 0.61–1.24)
GFR, Estimated: >60 mL/min (ref >60)
Glucose, Bld: 158 mg/dL — ABNORMAL HIGH (ref 70–99)
Potassium: 3.9 mmol/L (ref 3.5–5.1)
Sodium: 134 mmol/L — ABNORMAL LOW (ref 135–145)

## 2020-10-01 LAB — CBC
HCT: 36.8 % — ABNORMAL LOW (ref 39.0–52.0)
Hemoglobin: 11.5 g/dL — ABNORMAL LOW (ref 13.0–17.0)
MCH: 27.7 pg (ref 26.0–34.0)
MCHC: 31.3 g/dL (ref 30.0–36.0)
MCV: 88.7 fL (ref 80.0–100.0)
Platelets: 235 10*3/uL (ref 150–400)
RBC: 4.15 MIL/uL — ABNORMAL LOW (ref 4.22–5.81)
RDW: 14.4 % (ref 11.5–15.5)
WBC: 9.4 10*3/uL (ref 4.0–10.5)
nRBC: 0 % (ref 0.0–0.2)

## 2020-10-01 LAB — GLUCOSE, CAPILLARY: Glucose-Capillary: 156 mg/dL — ABNORMAL HIGH (ref 70–99)

## 2020-10-01 MED ORDER — DEXAMETHASONE 4 MG PO TABS
10.0000 mg | ORAL_TABLET | Freq: Once | ORAL | Status: AC
  Administered 2020-10-01: 10 mg via ORAL
  Filled 2020-10-01: qty 3

## 2020-10-01 NOTE — TOC Transition Note (Signed)
Transition of Care [facility]) - CM/SW Discharge Note   Patient Details  Name: EHAN FREAS MRN: [id number] Date of Birth: 1950-10-17  [facility] CM/SW Contact:  Angelita Ingles, RN Phone Number: [phone]  10/01/2020, 11:19 AM   Clinical Narrative:    Patient discharging home with [facility] service previously set up with [facility]. Any DME needs are provided by the unit. TOC will sign off.   Final next level of care: Home w Home Health Services Barriers to Discharge: No Barriers Identified   Patient Goals and CMS Choice   CMS Medicare.gov Compare Post Acute Care list provided to:: Patient Choice offered to / list presented to : Patient  Discharge Placement                       Discharge Plan and Services                DME Arranged:  (Patient has all DME in his home already after previous R-THA)           [place] Agency:  (Patient was comfortable with receiving no HHPT at this time. Knows all home exercises after previous hip replacement)        Social Determinants of Health (SDOH) Interventions     Readmission Risk Interventions No flowsheet data found.

## 2020-10-01 NOTE — Progress Notes (Signed)
Physical Therapy Treatment Patient Details Name: Curtis Hunt MRN: [id number] DOB: 1950-06-22 Today's Date: 10/01/2020    History of Present Illness Pt adm 09/30/20 for lt THR direct anterior approach. PMH - arthritis, gout, DM, prostate CA, rt THR    PT Comments    Continuing work on functional mobility and activity tolerance;  Session focused on progressive amb, and stair negotiation in prep for dc today; Managed walking with RW and stairs quite well, and pt is confident in his ability to manage safely in his home with family and friend assist as needed; OK for dc home from PT standpoint   Follow Up Recommendations  Follow surgeon's recommendation for DC plan and follow-up therapies;Other (comment) (worth considering HHPT--states he had HHPT before with good results; Noted in conversation with Ortho CM, pt did not feel strongly he needed HHPT)     Equipment Recommendations  None recommended by PT    Recommendations for Other Services       Precautions / Restrictions Precautions Precautions: None Restrictions LLE Weight Bearing: Weight bearing as tolerated    Mobility  Bed Mobility Overal bed mobility: Needs Assistance Bed Mobility: Supine to Sit     Supine to sit: Min assist     General bed mobility comments: Initial Assist to bring LLE off of bed, then gave pt a rolled bedsheet to lasso his L foot for self-assist to move LLE off of bed    Transfers Overall transfer level: Needs assistance Equipment used: Rolling walker (2 wheeled) Transfers: Sit to/from Stand Sit to Stand: Min guard (without physical assist)         General transfer comment: Cues/tips for prepositioning and control of rise and descent  Ambulation/Gait Ambulation/Gait assistance: Min guard (with and without physical contact) Gait Distance (Feet): 300 Feet Assistive device: Rolling walker (2 wheeled) Gait Pattern/deviations: Step-through pattern;Decreased step length - right;Decreased stance  time - left;Antalgic Gait velocity: decr   General Gait Details: Cues for initial heel strike L and to push heel down into the floor and stand tall on LLE in stance; encouraged him to activate L quad and gluteals in stance   Stairs Stairs: Yes Stairs assistance: Min guard Stair Management: One rail Left;Step to pattern;Sideways;Forwards (semi-sideways) Number of Stairs: 2 (x2) General stair comments: verbal and demo cues for sequence; managing well   Wheelchair Mobility    Modified Rankin (Stroke Patients Only)       Balance Overall balance assessment: No apparent balance deficits (not formally assessed)                                          Cognition Arousal/Alertness: Awake/alert Behavior During Therapy: WFL for tasks assessed/performed Overall Cognitive Status: Within Functional Limits for tasks assessed                                        Exercises Total Joint Exercises Ankle Circles/Pumps: AROM;15 reps;Both Quad Sets: AROM;Left;10 reps Gluteal Sets: AROM;Both;10 reps Towel Squeeze: AROM;Both;10 reps Heel Slides: AROM;Left;10 reps Hip ABduction/ADduction: AAROM;Left;10 reps    General Comments General comments (skin integrity, edema, etc.): We discussed car transfer and home support      Pertinent Vitals/Pain Pain Assessment: 0-10 Pain Score: 3  Pain Location: lt hip Pain Descriptors / Indicators: Aching;Sore;Tightness Pain Intervention(s): Monitored during  session;Ice applied    Home Living                      Prior Function            PT Goals (current goals can now be found in the care plan section) Acute Rehab PT Goals Patient Stated Goal: return home PT Goal Formulation: With patient Time For Goal Achievement: 10/04/20 Potential to Achieve Goals: Good Progress towards PT goals: Progressing toward goals    Frequency    7X/week      PT Plan Current plan remains appropriate     Co-evaluation              AM-PAC PT "6 Clicks" Mobility   Outcome Measure  Help needed turning from your back to your side while in a flat bed without using bedrails?: None Help needed moving from lying on your back to sitting on the side of a flat bed without using bedrails?: A Little Help needed moving to and from a bed to a chair (including a wheelchair)?: None Help needed standing up from a chair using your arms (e.g., wheelchair or bedside chair)?: A Little Help needed to walk in hospital room?: None Help needed climbing 3-5 steps with a railing? : A Little 6 Click Score: 21    End of Session Equipment Utilized During Treatment: Gait belt Activity Tolerance: Patient tolerated treatment well Patient left: in chair;with call bell/phone within reach Nurse Communication: Mobility status PT Visit Diagnosis: Other abnormalities of gait and mobility (R26.89);Pain Pain - Right/Left: Left Pain - part of body: Hip     Time: 0900-0950 PT Time Calculation (min) (ACUTE ONLY): 50 min  Charges:  $Gait Training: 8-22 mins $Therapeutic Exercise: 8-22 mins $Therapeutic Activity: 8-22 mins                     Roney Marion, PT  Acute Rehabilitation Services Pager [phone] Office [phone]    [place] 10/01/2020, 10:14 AM

## 2020-10-01 NOTE — Care Plan (Signed)
Ortho Bundle Case Management Note  Patient Details  Name: Curtis Hunt MRN: [id number] Date of Birth: Sep 22, 1950  [facility] call to patient to discuss his Left total hip arthroplasty with Dr. Erlinda Hong scheduled for 09/27/20. He is an Ortho bundle patient through [facility] and is agreeable to case management. Plan is to discharge to his home and he has a fiance and daughter that will be assisting him at home. He has all DME from previous hip replacement surgery last year (FWW, 3in1/BSC). Discussed post-op care instructions and these will be mailed to him for his review. He states he does not feel he will need HHPT services as he is familiar with the hip exercises and had right hip replacement in July of 2021. Will continue to follow for needs.                   DME Arranged:   (Patient has all DME in his home already after previous R-THA) DME Agency:     HH Arranged:    [place] Agency:   (Patient was comfortable with receiving no HHPT at this time. Knows all home exercises after previous hip replacement)  Additional Comments: Please contact me with any questions of if this plan should need to change.  Jamse Arn, RN, BSN, [employer]  [phone] 10/01/2020, 8:30 AM

## 2020-10-01 NOTE — Progress Notes (Signed)
Subjective: 1 Day Post-Op Procedure(s) (LRB): LEFT TOTAL HIP ARTHROPLASTY ANTERIOR APPROACH (Left) Patient reports pain as mild.  Doing great this am.   Objective: Vital signs in last 24 hours: Temp:  [97.3 F (36.3 C)-99.1 F (37.3 C)] 98 F (36.7 C) (05/10 0801) Pulse Rate:  [66-103] 81 (05/10 0801) Resp:  [13-20] 17 (05/10 0801) BP: (108-161)/(56-95) 127/76 (05/10 0801) SpO2:  [95 %-100 %] 97 % (05/10 0801) Weight:  [122 kg] 122 kg (05/09 0917)  Intake/Output from previous day: 05/09 0701 - 05/10 0700 In: 1400 [I.V.:1200; IV Piggyback:200] Out: 950 [Urine:750; Blood:200] Intake/Output this shift: No intake/output data recorded.  Recent Labs    10/01/20 0636  HGB 11.5*   Recent Labs    10/01/20 0636  WBC 9.4  RBC 4.15*  HCT 36.8*  PLT 235   Recent Labs    10/01/20 0636  NA 134*  K 3.9  CL 101  CO2 26  BUN 16  CREATININE 1.18  GLUCOSE 158*  CALCIUM 8.9   No results for input(s): LABPT, INR in the last 72 hours.  Neurologically intact Neurovascular intact Sensation intact distally Intact pulses distally Dorsiflexion/Plantar flexion intact Incision: dressing C/D/I No cellulitis present Compartment soft   Assessment/Plan: 1 Day Post-Op Procedure(s) (LRB): LEFT TOTAL HIP ARTHROPLASTY ANTERIOR APPROACH (Left) Advance diet Up with therapy D/C IV fluids Discharge home with home health after second PT session WBAT LLE ABLA- mild and stable       Aundra Dubin 10/01/2020, 8:09 AM

## 2020-10-01 NOTE — Discharge Instructions (Signed)

## 2020-10-01 NOTE — Progress Notes (Signed)
Patient is discharged from room 3C11 at this time. Alert and in stable condition. IV site d/c'd and instructions read to patient and spouse with understanding verbalized and all questions answered. Left unit via wheelchair with all belongings at side. 

## 2020-10-01 NOTE — Discharge Summary (Signed)
Patient ID: Curtis Hunt MRN: [id number] DOB/AGE: [birth year] 70 y.o.  Admit date: 09/30/2020 Discharge date: 10/01/2020  Admission Diagnoses:  Principal Problem:   Primary osteoarthritis of left hip Active Problems:   Status post total replacement of left hip   Discharge Diagnoses:  Same  Past Medical History:  Diagnosis Date  . Arthritis   . Cancer [facility]) 2004   Prostate CA with surgery  . Diabetes mellitus without complication ([place])   . Gout   . Hyperlipidemia   . Prostate cancer [facility])     Surgeries: Procedure(s): LEFT TOTAL HIP ARTHROPLASTY ANTERIOR APPROACH on 09/30/2020   Consultants:   Discharged Condition: Improved  Hospital Course: Curtis Hunt is an 70 y.o. male who was admitted 09/30/2020 for operative treatment ofPrimary osteoarthritis of left hip. Patient has severe unremitting pain that affects sleep, daily activities, and work/hobbies. After pre-op clearance the patient was taken to the operating room on 09/30/2020 and underwent  Procedure(s): LEFT TOTAL HIP ARTHROPLASTY ANTERIOR APPROACH.    Patient was given perioperative antibiotics:  Anti-infectives (From admission, onward)   Start     Dose/Rate Route Frequency Ordered Stop   09/30/20 1700  ceFAZolin (ANCEF) IVPB 2g/100 mL premix        2 g 200 mL/hr over 30 Minutes Intravenous Every 6 hours 09/30/20 1556 10/01/20 0027   09/30/20 1106  vancomycin (VANCOCIN) powder  Status:  Discontinued          As needed 09/30/20 1106 09/30/20 1322   09/30/20 0945  ceFAZolin (ANCEF) IVPB 2g/100 mL premix        2 g 200 mL/hr over 30 Minutes Intravenous On call to O.R. 09/30/20 9150 09/30/20 1117       Patient was given sequential compression devices, early ambulation, and chemoprophylaxis to prevent DVT.  Patient benefited maximally from hospital stay and there were no complications.    Recent vital signs:  Patient Vitals for the past 24 hrs:  BP Temp Temp src Pulse Resp SpO2 Height Weight  10/01/20 0801 127/76  98 F (36.7 C) Oral 81 17 97 % -- --  10/01/20 0427 (!) 161/88 99.1 F (37.3 C) Oral 90 -- 95 % -- --  09/30/20 2352 (!) 152/81 98.4 F (36.9 C) Oral 81 20 96 % -- --  09/30/20 2019 (!) 153/95 98.2 F (36.8 C) Oral (!) 103 20 97 % -- --  09/30/20 1547 (!) 142/94 97.8 F (36.6 C) Oral 77 18 98 % -- --  09/30/20 1500 119/77 98 F (36.7 C) -- 86 17 97 % -- --  09/30/20 1445 (!) 116/56 -- -- 77 15 96 % -- --  09/30/20 1430 130/84 -- -- 74 15 97 % -- --  09/30/20 1415 123/73 -- -- 72 13 98 % -- --  09/30/20 1400 113/77 -- -- 66 15 100 % -- --  09/30/20 1345 121/80 -- -- 72 15 98 % -- --  09/30/20 1330 108/77 (!) 97.3 F (36.3 C) -- 76 16 98 % -- --  09/30/20 0917 (!) 142/77 98 F (36.7 C) Oral 72 17 96 % 6' 1" (1.854 m) 122 kg     Recent laboratory studies:  Recent Labs    10/01/20 0636  WBC 9.4  HGB 11.5*  HCT 36.8*  PLT 235  NA 134*  K 3.9  CL 101  CO2 26  BUN 16  CREATININE 1.18  GLUCOSE 158*  CALCIUM 8.9     Discharge Medications:   Allergies as of  10/01/2020   No Known Allergies     Medication List    STOP taking these medications   meloxicam 7.5 MG tablet Commonly known as: Mobic     TAKE these medications   allopurinol 100 MG tablet Commonly known as: ZYLOPRIM Take 3 tablets (300 mg total) by mouth daily.   aspirin EC 81 MG tablet Take 1 tablet (81 mg total) by mouth 2 (two) times daily. To be taken after surgery What changed: Another medication with the same name was removed. Continue taking this medication, and follow the directions you see here.   docusate sodium 100 MG capsule Commonly known as: Colace Take 1 capsule (100 mg total) by mouth daily as needed.   glucose blood test strip Commonly known as: ONE TOUCH ULTRA TEST CHECK SUGAR 4-5 TIMES A DAY.EVERY MORNING AFTER MEALS AND AT BEDTIME   OneTouch Ultra test strip Generic drug: glucose blood CHECK SUGAR 4 TO 5 TIMES DAILY EVERY MORNING, AFTER MEALS, AND AT BEDTIME E11.65   Insulin  Pen Needle 31G X 6 MM Misc Use with Levemir Flextouch Pen   B-D UF III MINI PEN NEEDLES 31G X 5 MM Misc Generic drug: Insulin Pen Needle USE AS DIRECTED TWICE A DAY   Levemir FlexTouch 100 UNIT/ML FlexPen Generic drug: insulin detemir INJECT 25 UNITS INTO THE SKIN DAILY AT 10 PM. What changed: how much to take   methocarbamol 500 MG tablet Commonly known as: Robaxin Take 1 tablet (500 mg total) by mouth 2 (two) times daily as needed. To be taken after surgery   ondansetron 4 MG tablet Commonly known as: Zofran Take 1 tablet (4 mg total) by mouth every 8 (eight) hours as needed for nausea or vomiting.   ONE TOUCH ULTRA 2 w/Device Kit ONE TOUCH ULTRA 2 GLUCOSE SYSTEM W/ DEVICE KIT   oxyCODONE-acetaminophen 5-325 MG tablet Commonly known as: Percocet Take 1-2 tablets by mouth every 6 (six) hours as needed. To be taken after surgery   rosuvastatin 40 MG tablet Commonly known as: CRESTOR TAKE 1 TABLET BY MOUTH EVERY DAY   sulfamethoxazole-trimethoprim 800-160 MG tablet Commonly known as: BACTRIM DS Take 1 tablet by mouth 2 (two) times daily. To be taken after surgery   Trulicity 1.5 MG/0.5ML Sopn Generic drug: Dulaglutide INJECT 1.5 MG AS DIRECTED EVERY TUESDAY. What changed: when to take this   Viagra 100 MG tablet Generic drug: sildenafil TAKE 0.5-1 TABLETS (50-100 MG TOTAL) BY MOUTH DAILY AS NEEDED FOR ERECTILE DYSFUNCTION.            Durable Medical Equipment  (From admission, onward)         Start     Ordered   09/30/20 1557  DME Walker rolling  Once       Question:  Patient needs a walker to treat with the following condition  Answer:  History of hip replacement   09/30/20 1556   09/30/20 1557  DME 3 n 1  Once        09/30/20 1556   09/30/20 1557  DME Bedside commode  Once       Question:  Patient needs a bedside commode to treat with the following condition  Answer:  History of hip replacement   09/30/20 1556          Diagnostic Studies: DG Chest  2 View  Result Date: 09/27/2020 CLINICAL DATA:  Preop left hip replacement EXAM: CHEST - 2 VIEW COMPARISON:  12/14/2019 FINDINGS: The heart size and mediastinal contours are   within normal limits. Both lungs are clear. The visualized skeletal structures are unremarkable. IMPRESSION: No active cardiopulmonary disease. Electronically Signed   By: Hetal  Patel   On: 09/27/2020 13:38   DG Pelvis Portable  Result Date: 09/30/2020 CLINICAL DATA:  Post LEFT total hip arthroplasty anterior approach EXAM: PORTABLE PELVIS 1-2 VIEWS COMPARISON:  Portable exam 1432 hours compared to 06/19/2020 FINDINGS: New LEFT hip prosthesis. No fracture or dislocation. Old RIGHT hip prosthesis unchanged. SI joints preserved. IMPRESSION: New LEFT hip prosthesis without acute abnormality. Electronically Signed   By: Mark  Boles M.D.   On: 09/30/2020 16:04   DG C-Arm 1-60 Min  Result Date: 09/30/2020 CLINICAL DATA:  Surgery, elective. Additional history provided: Left total hip arthroplasty, anterior approach. Provided fluoroscopy time 27 seconds (5.94 mGy). EXAM: OPERATIVE left HIP (WITH PELVIS IF PERFORMED) 4 VIEWS TECHNIQUE: Fluoroscopic spot image(s) were submitted for interpretation post-operatively. COMPARISON:  Radiograph of the pelvis 06/19/2020. FINDINGS: Four intraoperative fluoroscopic images of the left hip are submitted. On the provided images, there are findings of interval left total hip arthroplasty. The femoral and acetabular components appear well seated. No unexpected finding. Partial visualization of a pre-existing right total hip arthroplasty. IMPRESSION: Four intraoperative fluoroscopic images of the left hip from left total hip arthroplasty, as described. Electronically Signed   By: Kyle  Golden DO   On: 09/30/2020 13:17   DG HIP OPERATIVE UNILAT WITH PELVIS LEFT  Result Date: 09/30/2020 CLINICAL DATA:  Surgery, elective. Additional history provided: Left total hip arthroplasty, anterior approach. Provided  fluoroscopy time 27 seconds (5.94 mGy). EXAM: OPERATIVE left HIP (WITH PELVIS IF PERFORMED) 4 VIEWS TECHNIQUE: Fluoroscopic spot image(s) were submitted for interpretation post-operatively. COMPARISON:  Radiograph of the pelvis 06/19/2020. FINDINGS: Four intraoperative fluoroscopic images of the left hip are submitted. On the provided images, there are findings of interval left total hip arthroplasty. The femoral and acetabular components appear well seated. No unexpected finding. Partial visualization of a pre-existing right total hip arthroplasty. IMPRESSION: Four intraoperative fluoroscopic images of the left hip from left total hip arthroplasty, as described. Electronically Signed   By: Kyle  Golden DO   On: 09/30/2020 13:17    Disposition: Discharge disposition: 01-Home or Self Care            Signed: Mary L Stanbery 10/01/2020, 8:12 AM     

## 2020-10-02 ENCOUNTER — Ambulatory Visit: Payer: Medicare Other | Admitting: Podiatry

## 2020-10-02 ENCOUNTER — Telehealth: Payer: Self-pay | Admitting: *Deleted

## 2020-10-02 NOTE — Telephone Encounter (Signed)
Attempted Ortho bundle D/C call to patient. No answer and left VM.

## 2020-10-03 ENCOUNTER — Telehealth: Payer: Self-pay | Admitting: *Deleted

## 2020-10-03 NOTE — Telephone Encounter (Signed)
Ortho bundle D/C call completed. 

## 2020-10-07 ENCOUNTER — Telehealth: Payer: Self-pay | Admitting: *Deleted

## 2020-10-07 NOTE — Telephone Encounter (Signed)
Ortho bundle 7 day call completed. 

## 2020-10-09 ENCOUNTER — Other Ambulatory Visit: Payer: Self-pay | Admitting: *Deleted

## 2020-10-09 MED ORDER — TRULICITY 1.5 MG/0.5ML ~~LOC~~ SOAJ: 1.5000 mg | SUBCUTANEOUS | 0 refills | Status: DC

## 2020-10-15 ENCOUNTER — Ambulatory Visit (INDEPENDENT_AMBULATORY_CARE_PROVIDER_SITE_OTHER): Payer: Medicare Other | Admitting: Physician Assistant

## 2020-10-15 ENCOUNTER — Encounter: Payer: Medicare Other | Admitting: Orthopaedic Surgery

## 2020-10-15 ENCOUNTER — Encounter: Payer: Self-pay | Admitting: Orthopaedic Surgery

## 2020-10-15 VITALS — Ht 73.0 in | Wt 269.0 lb

## 2020-10-15 DIAGNOSIS — Z96642 Presence of left artificial hip joint: Secondary | ICD-10-CM

## 2020-10-15 NOTE — Progress Notes (Signed)
Post-Op Visit Note   Patient: Curtis Hunt           Date of Birth: 06/11/1950           MRN: [id number] Visit Date: 10/15/2020 PCP: Susy Frizzle, MD   Assessment & Plan:  Chief Complaint:  Chief Complaint  Patient presents with  . Left Hip - Follow-up    Left total hip arthroplasty 09/30/2020   Visit Diagnoses:  1. H/O total hip arthroplasty, left     Plan: Patient is a very pleasant 70 year old gentleman who comes in today 2 weeks out left total hip replacement from 922.  He has been doing well.  He has a friend that has been helping him rehab this.  He is ambulating without assistance.  He is taking an occasional oxycodone with relief.  No fevers or chills.  Examination of his left hip reveals a fully healed surgical scar with nylon sutures in place.  No evidence of infection or cellulitis.  Left calf is nontender.  He does have a fair amount of swelling to the left lower extremity.  Today, sutures were removed and Steri-Strips applied.  Continue with his home exercise program.  I encouraged him to continue wearing his compression stocking to help with the swelling.  He will follow-up with [place] in 4 weeks time for repeat evaluation and AP pelvis x-rays.  Dental prophylaxis reinforced.  Call with concerns or questions.  Follow-Up Instructions: Return in about 4 weeks (around 11/12/2020).   Orders:  No orders of the defined types were placed in this encounter.  No orders of the defined types were placed in this encounter.   Imaging: No new imaging  PMFS History: Patient Active Problem List   Diagnosis Date Noted  . Primary osteoarthritis of left hip 09/30/2020  . Status post total replacement of left hip 09/30/2020  . Change in bowel habit 07/03/2020  . Colon cancer screening 07/03/2020  . Diverticular disease of colon 07/03/2020  . Hematochezia 07/03/2020  . Hiatal hernia 07/03/2020  . Iron deficiency anemia 07/03/2020  . Morbid obesity ([place]) 07/03/2020  .  Personal history of colonic polyps 07/03/2020  . Status post total replacement of right hip 12/18/2019  . Malignant neoplasm of prostate ([place]) 04/29/2015  . Rhabdomyolysis 06/09/2013  . Dehydration 06/09/2013  . Newly diagnosed diabetes ([place]) 06/09/2013  . Dyslipidemia 06/09/2013  . Metabolic encephalopathy 40/34/7425  . Hypernatremia 06/09/2013  . DKA (diabetic ketoacidoses) 06/08/2013  . Weight loss 06/08/2013  . Oral thrush 06/08/2013  . GERD (gastroesophageal reflux disease) 06/08/2013  . Jerking movements of extremities 06/08/2013  . Acute renal failure ([place]) 06/08/2013  . Gout    Past Medical History:  Diagnosis Date  . Arthritis   . Cancer [facility]) 2004   Prostate CA with surgery  . Diabetes mellitus without complication ([place])   . Gout   . Hyperlipidemia   . Prostate cancer [facility])     Family History  Problem Relation Age of Onset  . Cancer Brother        prostate  . Cancer Mother        breast ca    Past Surgical History:  Procedure Laterality Date  . JOINT REPLACEMENT Right 12/18/2019   total hip replacement w/ Dr. Erlinda Hong  . PROSTATE SURGERY    . TOTAL HIP ARTHROPLASTY Right 12/18/2019   Procedure: RIGHT TOTAL HIP ARTHROPLASTY ANTERIOR APPROACH;  Surgeon: Leandrew Koyanagi, MD;  Location: [place];  Service: Orthopedics;  Laterality: Right;  .  TOTAL HIP ARTHROPLASTY Left 09/30/2020   Procedure: LEFT TOTAL HIP ARTHROPLASTY ANTERIOR APPROACH;  Surgeon: Leandrew Koyanagi, MD;  Location: [place];  Service: Orthopedics;  Laterality: Left;   Social History   Occupational History  . Not on file  Tobacco Use  . Smoking status: Never Smoker  . Smokeless tobacco: Never Used  Vaping Use  . Vaping Use: Never used  Substance and Sexual Activity  . Alcohol use: Yes    Comment: Beer prior to diagnosis of diabetes, about once a week during sports on tv  . Drug use: No  . Sexual activity: Not Currently

## 2020-10-17 ENCOUNTER — Telehealth: Payer: Self-pay | Admitting: *Deleted

## 2020-10-17 NOTE — Telephone Encounter (Signed)
Ortho bundle 14 day call completed. 

## 2020-10-25 ENCOUNTER — Encounter: Payer: Self-pay | Admitting: *Deleted

## 2020-10-25 ENCOUNTER — Encounter: Payer: Self-pay | Admitting: Family Medicine

## 2020-10-25 ENCOUNTER — Ambulatory Visit (INDEPENDENT_AMBULATORY_CARE_PROVIDER_SITE_OTHER): Payer: Medicare Other | Admitting: Family Medicine

## 2020-10-25 ENCOUNTER — Other Ambulatory Visit: Payer: Self-pay

## 2020-10-25 ENCOUNTER — Telehealth: Payer: Self-pay | Admitting: Pharmacist

## 2020-10-25 VITALS — BP 140/78 | HR 62 | Temp 98.3°F | Resp 16 | Ht 73.0 in | Wt 269.0 lb

## 2020-10-25 DIAGNOSIS — Z794 Long term (current) use of insulin: Secondary | ICD-10-CM

## 2020-10-25 DIAGNOSIS — E785 Hyperlipidemia, unspecified: Secondary | ICD-10-CM

## 2020-10-25 DIAGNOSIS — E119 Type 2 diabetes mellitus without complications: Secondary | ICD-10-CM | POA: Diagnosis not present

## 2020-10-25 MED ORDER — SILDENAFIL CITRATE 100 MG PO TABS: 50.0000 mg | ORAL_TABLET | Freq: Every day | ORAL | 11 refills | Status: DC | PRN

## 2020-10-25 MED ORDER — TRAZODONE HCL 50 MG PO TABS: 50.0000 mg | ORAL_TABLET | Freq: Every evening | ORAL | 2 refills | Status: AC | PRN

## 2020-10-25 NOTE — Progress Notes (Signed)
Subjective:    Patient ID: Curtis Hunt, male    DOB: 1951/02/22, 70 y.o.   MRN: [id number]  HPI Since I last saw the patient, he has had bilateral hip surgery performed.  His hemoglobin A1c in April was excellent at 6.4.  His blood pressure today is well controlled at 140/78.  He denies any polydipsia, blurry vision, polyuria.  He denies any neuropathy in his feet.  He does have some erectile dysfunction and is about to get married.  He is interested in trying Viagra.  He also endorses some mild insomnia at night and is interested in trying trazodone.  He denies any chest pain shortness of breath or dyspnea on exertion.  He had a CBC and a CMP at the hospital during his surgery the first part of May that was completely normal.  He is due for fasting lipid panel Immunization History  Administered Date(s) Administered  . Fluad Quad(high Dose 65+) 02/14/2019, 02/22/2020  . Influenza Split 02/22/2013, 02/22/2014  . Influenza-Unspecified 02/19/2015, 02/13/2016, 02/22/2017  . PFIZER(Purple Top)SARS-COV-2 Vaccination 06/19/2019, 07/10/2019, 04/05/2020  . PPD Test 09/13/2012, 08/02/2013, 08/20/2014, 09/03/2015, 09/21/2016  . Pneumococcal Conjugate-13 09/24/2017  . Pneumococcal Polysaccharide-23 05/11/2014, 02/22/2020  . Tdap 09/10/2010  . Zoster, Live 09/13/2012     Past Medical History:  Diagnosis Date  . Arthritis   . Cancer [facility]) 2004   Prostate CA with surgery  . Diabetes mellitus without complication ([place])   . Gout   . Hyperlipidemia   . Prostate cancer [facility])    Past Surgical History:  Procedure Laterality Date  . JOINT REPLACEMENT Right 12/18/2019   total hip replacement w/ Dr. Erlinda Hong  . PROSTATE SURGERY    . TOTAL HIP ARTHROPLASTY Right 12/18/2019   Procedure: RIGHT TOTAL HIP ARTHROPLASTY ANTERIOR APPROACH;  Surgeon: Leandrew Koyanagi, MD;  Location: [place];  Service: Orthopedics;  Laterality: Right;  . TOTAL HIP ARTHROPLASTY Left 09/30/2020   Procedure: LEFT TOTAL HIP ARTHROPLASTY  ANTERIOR APPROACH;  Surgeon: Leandrew Koyanagi, MD;  Location: [place];  Service: Orthopedics;  Laterality: Left;   Current Outpatient Medications on File Prior to Visit  Medication Sig Dispense Refill  . aspirin EC 81 MG tablet Take 1 tablet (81 mg total) by mouth 2 (two) times daily. To be taken after surgery 84 tablet 0  . B-D UF III MINI PEN NEEDLES 31G X 5 MM MISC USE AS DIRECTED TWICE A DAY 100 each 3  . Blood Glucose Monitoring Suppl (ONE TOUCH ULTRA 2) w/Device KIT ONE TOUCH ULTRA 2 GLUCOSE SYSTEM W/ DEVICE KIT 1 kit 0  . Dulaglutide (TRULICITY) 1.5 AQ/7.6AU SOPN Inject 1.5 mg as directed every Tuesday. 2 mL 0  . glucose blood (ONE TOUCH ULTRA TEST) test strip CHECK SUGAR 4-5 TIMES A DAY.EVERY MORNING AFTER MEALS AND AT BEDTIME 200 each 8  . LEVEMIR FLEXTOUCH 100 UNIT/ML FlexPen INJECT 25 UNITS INTO THE SKIN DAILY AT 10 PM. (Patient taking differently: Inject 22 Units into the skin daily at 10 pm.) 15 mL 3  . rosuvastatin (CRESTOR) 40 MG tablet TAKE 1 TABLET BY MOUTH EVERY DAY (Patient taking differently: Take 40 mg by mouth daily.) 90 tablet 1   No current facility-administered medications on file prior to visit.   No Known Allergies Social History   Socioeconomic History  . Marital status: Widowed    Spouse name: Not on file  . Number of children: Not on file  . Years of education: Not on file  . Highest education  level: Not on file  Occupational History  . Not on file  Tobacco Use  . Smoking status: Never Smoker  . Smokeless tobacco: Never Used  Vaping Use  . Vaping Use: Never used  Substance and Sexual Activity  . Alcohol use: Yes    Comment: Beer prior to diagnosis of diabetes, about once a week during sports on tv  . Drug use: No  . Sexual activity: Not Currently  Other Topics Concern  . Not on file  Social History Narrative  . Not on file   Social Determinants of Health   Financial Resource Strain: Low Risk   . Difficulty of Paying Living Expenses: Not hard at all   Food Insecurity: Not on file  Transportation Needs: Not on file  Physical Activity: Not on file  Stress: Not on file  Social Connections: Not on file  Intimate Partner Violence: Not on file   Family History  Problem Relation Age of Onset  . Cancer Brother        prostate  . Cancer Mother        breast ca     Review of Systems  All other systems reviewed and are negative.      Objective:   Physical Exam Vitals reviewed.  Constitutional:      General: He is not in acute distress.    Appearance: He is well-developed. He is not diaphoretic.  HENT:     Head: Normocephalic and atraumatic.     Right Ear: External ear normal.     Left Ear: External ear normal.     Nose: Nose normal.     Mouth/Throat:     Pharynx: No oropharyngeal exudate.  Eyes:     General: No scleral icterus.       Right eye: No discharge.        Left eye: No discharge.     Conjunctiva/sclera: Conjunctivae normal.     Pupils: Pupils are equal, round, and reactive to light.  Neck:     Thyroid: No thyromegaly.     Vascular: No JVD.     Trachea: No tracheal deviation.  Cardiovascular:     Rate and Rhythm: Normal rate and regular rhythm.     Heart sounds: Normal heart sounds. No murmur heard. No friction rub. No gallop.   Pulmonary:     Effort: Pulmonary effort is normal. No respiratory distress.     Breath sounds: Normal breath sounds. No stridor. No wheezing or rales.  Chest:     Chest wall: No tenderness.  Abdominal:     General: Bowel sounds are normal. There is no distension.     Palpations: Abdomen is soft. There is no mass.     Tenderness: There is no abdominal tenderness. There is no guarding or rebound.     Hernia: No hernia is present.  Musculoskeletal:        General: No tenderness or deformity. Normal range of motion.     Cervical back: Normal range of motion and neck supple.  Lymphadenopathy:     Cervical: No cervical adenopathy.  Skin:    General: Skin is warm.     Coloration:  Skin is not pale.     Findings: No erythema or rash.  Neurological:     Mental Status: He is alert and oriented to person, place, and time.     Cranial Nerves: No cranial nerve deficit.     Sensory: No sensory deficit.     Motor: No abnormal  muscle tone.     Coordination: Coordination normal.     Deep Tendon Reflexes: Reflexes normal.  Psychiatric:        Behavior: Behavior normal.        Thought Content: Thought content normal.        Judgment: Judgment normal.           Assessment & Plan:  Controlled type 2 diabetes mellitus without complication, with long-term current use of insulin (HCC)  Dyslipidemia  A1c was outstanding in April.  Blood pressure today is well controlled.  CBC and CMP were excellent in May.  He is due for fasting lipid panel.  Return fasting at his convenience for fasting lipid panel along with urine microalbumin.  Recommended his fourth COVID shot.  Also recommended Shingrix.  The remainder of his preventative care is up-to-date.  We will try trazodone 50 mg p.o. nightly for insomnia.  We will try Viagra 50 to 100 mg p.o. daily as needed sexual activity

## 2020-10-25 NOTE — Progress Notes (Addendum)
Chronic Care Management Pharmacy Assistant   Name: Curtis Hunt  MRN: [id number] DOB: [birth year]   Reason for Encounter: Medication Review   Conditions to be addressed/monitored: GERD, diabetes, gout, dyslipidemia  Recent office visits:  None since 08/28/20  Recent consult visits:  10/15/20 Orthopedics Cristie Hem, PA-C. For H/O total hip arthroplasty, Left. No medication changes.  [facility] For primary osteoarthritis of left hip (Left total hip arthoplasty anterior approach). (26 Hours) STOPPED Meloxicam. CHANGED Levemir Flex touch to 25 units at 10 pm and Trulicity to inject 1.5 mg every Tuesday.  Medications: Outpatient Encounter Medications as of 10/25/2020  Medication Sig Note   allopurinol (ZYLOPRIM) 100 MG tablet Take 3 tablets (300 mg total) by mouth daily.    aspirin EC 81 MG tablet Take 1 tablet (81 mg total) by mouth 2 (two) times daily. To be taken after surgery    B-D UF III MINI PEN NEEDLES 31G X 5 MM MISC USE AS DIRECTED TWICE A DAY    Blood Glucose Monitoring Suppl (ONE TOUCH ULTRA 2) w/Device KIT ONE TOUCH ULTRA 2 GLUCOSE SYSTEM W/ DEVICE KIT    docusate sodium (COLACE) 100 MG capsule Take 1 capsule (100 mg total) by mouth daily as needed.    Dulaglutide (TRULICITY) 1.5 MG/0.5ML SOPN Inject 1.5 mg as directed every Tuesday.    glucose blood (ONE TOUCH ULTRA TEST) test strip CHECK SUGAR 4-5 TIMES A DAY.EVERY MORNING AFTER MEALS AND AT BEDTIME    Insulin Pen Needle 31G X 6 MM MISC Use with Levemir Flextouch Pen    LEVEMIR FLEXTOUCH 100 UNIT/ML FlexPen INJECT 25 UNITS INTO THE SKIN DAILY AT 10 PM. (Patient taking differently: Inject 22 Units into the skin daily at 10 pm.) 09/30/2020: Pt took 10 units at bedtime   methocarbamol (ROBAXIN) 500 MG tablet Take 1 tablet (500 mg total) by mouth 2 (two) times daily as needed. To be taken after surgery    ondansetron (ZOFRAN) 4 MG tablet Take 1 tablet (4 mg  total) by mouth every 8 (eight) hours as needed for nausea or vomiting.    ONETOUCH ULTRA test strip CHECK SUGAR 4 TO 5 TIMES DAILY EVERY MORNING, AFTER MEALS, AND AT BEDTIME E11.65    oxyCODONE-acetaminophen (PERCOCET) 5-325 MG tablet Take 1-2 tablets by mouth every 6 (six) hours as needed. To be taken after surgery    rosuvastatin (CRESTOR) 40 MG tablet TAKE 1 TABLET BY MOUTH EVERY DAY (Patient taking differently: Take 40 mg by mouth daily.)    sulfamethoxazole-trimethoprim (BACTRIM DS) 800-160 MG tablet Take 1 tablet by mouth 2 (two) times daily. To be taken after surgery    VIAGRA 100 MG tablet TAKE 0.5-1 TABLETS (50-100 MG TOTAL) BY MOUTH DAILY AS NEEDED FOR ERECTILE DYSFUNCTION.    No facility-administered encounter medications on file as of 10/25/2020.   Care Gaps: Spoke wit the patient about all of his medications. We discussed the fill dates, how he takes all of the medication as well as the MG on the medications. The patient is up to date on all refills and has all of his active medications that he needs at this time. The patient did not have any questions or concerns about his medications at this time.   Star Rating Drugs: Rosuvastatin 40 mg 90 DS 10/18/20  Follow-Up:Pharmacist Review  Hulen Luster, RMA Clinical Pharmacist Assistant [phone]  10 minutes spent in review, coordination, and documentation.  Reviewed by: Ephriam Knuckles  Rosana Hoes, PharmD Clinical Pharmacist [place] [phone]

## 2020-11-04 ENCOUNTER — Ambulatory Visit: Payer: Medicare Other | Admitting: Podiatry

## 2020-11-08 ENCOUNTER — Telehealth: Payer: Self-pay | Admitting: *Deleted

## 2020-11-08 NOTE — Telephone Encounter (Signed)
Ortho bundle 30 day call and survey completed. ?

## 2020-11-09 ENCOUNTER — Other Ambulatory Visit: Payer: Self-pay | Admitting: Physician Assistant

## 2020-11-12 ENCOUNTER — Encounter: Payer: Medicare Other | Admitting: Orthopaedic Surgery

## 2020-11-12 ENCOUNTER — Telehealth: Payer: Self-pay

## 2020-11-12 MED ORDER — BD PEN NEEDLE MINI U/F 31G X 5 MM MISC: 3 refills | Status: DC

## 2020-11-12 NOTE — Telephone Encounter (Signed)
Medication refill for mini pen needles

## 2020-11-14 ENCOUNTER — Ambulatory Visit (INDEPENDENT_AMBULATORY_CARE_PROVIDER_SITE_OTHER): Payer: Medicare Other | Admitting: Orthopaedic Surgery

## 2020-11-14 ENCOUNTER — Ambulatory Visit (INDEPENDENT_AMBULATORY_CARE_PROVIDER_SITE_OTHER): Payer: Medicare Other

## 2020-11-14 ENCOUNTER — Other Ambulatory Visit: Payer: Self-pay

## 2020-11-14 ENCOUNTER — Encounter: Payer: Self-pay | Admitting: Orthopaedic Surgery

## 2020-11-14 VITALS — Ht 73.0 in | Wt 269.0 lb

## 2020-11-14 DIAGNOSIS — Z96642 Presence of left artificial hip joint: Secondary | ICD-10-CM

## 2020-11-14 MED ORDER — HYDROCODONE-ACETAMINOPHEN 5-325 MG PO TABS: 1.0000 | ORAL_TABLET | Freq: Every day | ORAL | 0 refills | Status: DC | PRN

## 2020-11-14 NOTE — Progress Notes (Signed)
Post-Op Visit Note   Patient: Curtis Hunt           Date of Birth: Apr 16, 1951           MRN: [id number] Visit Date: 11/14/2020 PCP: Susy Frizzle, MD   Assessment & Plan:  Chief Complaint:  Chief Complaint  Patient presents with   Left Hip - Follow-up    Left total hip arthroplasty 09/30/2020   Visit Diagnoses:  1. H/O total hip arthroplasty, left     Plan: Dyllon is 6 weeks status post left total hip replacement.  He has done well from the surgery.  He has some discomfort when laying on his left hip at nighttime.  Reports some mild start up stiffness when getting out of a car.  Occasional breakthrough pain.  Overall doing well with daily activities.  Left hip exam shows fully healed surgical scar.  No signs of infection or swelling.  Good range of motion without pain.  He has returned back to normal ambulation and gait.  X-rays unremarkable.  At this point Ronon will continue to increase activity as tolerated.  He did well from his right hip replacement which was done about a year ago.  He requested a refill of the hydrocodone for only breakthrough pain mainly at night.  He can drop back to his normal baby aspirin daily.  Dental prophylaxis reinforced.  Recheck in 6 weeks.  Follow-Up Instructions: Return in about 6 weeks (around 12/26/2020).   Orders:  Orders Placed This Encounter  Procedures   XR Pelvis 1-2 Views   Meds ordered this encounter  Medications   HYDROcodone-acetaminophen (NORCO) 5-325 MG tablet    Sig: Take 1 tablet by mouth daily as needed.    Dispense:  20 tablet    Refill:  0    Imaging: XR Pelvis 1-2 Views  Result Date: 11/14/2020 Stable total hip replacement without complications   PMFS History: Patient Active Problem List   Diagnosis Date Noted   Primary osteoarthritis of left hip 09/30/2020   Status post total replacement of left hip 09/30/2020   Change in bowel habit 07/03/2020   Colon cancer screening 07/03/2020   Diverticular  disease of colon 07/03/2020   Hematochezia 07/03/2020   Hiatal hernia 07/03/2020   Iron deficiency anemia 07/03/2020   Morbid obesity ([place]) 07/03/2020   Personal history of colonic polyps 07/03/2020   Status post total replacement of right hip 12/18/2019   Malignant neoplasm of prostate ([place]) 04/29/2015   Rhabdomyolysis 06/09/2013   Dehydration 06/09/2013   Newly diagnosed diabetes ([place]) 06/09/2013   Dyslipidemia 25/36/6440   Metabolic encephalopathy 34/74/2595   Hypernatremia 06/09/2013   DKA (diabetic ketoacidoses) 06/08/2013   Weight loss 06/08/2013   Oral thrush 06/08/2013   GERD (gastroesophageal reflux disease) 06/08/2013   Jerking movements of extremities 06/08/2013   Acute renal failure ([place]) 06/08/2013   Gout    Past Medical History:  Diagnosis Date   Arthritis    Cancer ([place]) 2004   Prostate CA with surgery   Diabetes mellitus without complication ([place])    Gout    Hyperlipidemia    Prostate cancer ([place])     Family History  Problem Relation Age of Onset   Cancer Brother        prostate   Cancer Mother        breast ca    Past Surgical History:  Procedure Laterality Date   JOINT REPLACEMENT Right 12/18/2019   total hip replacement w/ Dr. Erlinda Hong  PROSTATE SURGERY     TOTAL HIP ARTHROPLASTY Right 12/18/2019   Procedure: RIGHT TOTAL HIP ARTHROPLASTY ANTERIOR APPROACH;  Surgeon: Leandrew Koyanagi, MD;  Location: [facility];  Service: Orthopedics;  Laterality: Right;   TOTAL HIP ARTHROPLASTY Left 09/30/2020   Procedure: LEFT TOTAL HIP ARTHROPLASTY ANTERIOR APPROACH;  Surgeon: Leandrew Koyanagi, MD;  Location: [facility];  Service: Orthopedics;  Laterality: Left;   Social History   Occupational History   Not on file  Tobacco Use   Smoking status: Never   Smokeless tobacco: Never  Vaping Use   Vaping Use: Never used  Substance and Sexual Activity   Alcohol use: Yes    Comment: Beer prior to diagnosis of diabetes, about once a week during sports on tv   Drug use: No   Sexual  activity: Not Currently

## 2020-11-15 ENCOUNTER — Other Ambulatory Visit: Payer: Self-pay | Admitting: *Deleted

## 2020-11-15 MED ORDER — TRULICITY 1.5 MG/0.5ML ~~LOC~~ SOAJ: 1.5000 mg | SUBCUTANEOUS | 1 refills | Status: DC

## 2020-11-22 ENCOUNTER — Other Ambulatory Visit: Payer: Self-pay | Admitting: Family Medicine

## 2020-12-12 ENCOUNTER — Telehealth: Payer: Self-pay

## 2020-12-26 ENCOUNTER — Encounter: Payer: Self-pay | Admitting: Orthopaedic Surgery

## 2020-12-26 ENCOUNTER — Other Ambulatory Visit: Payer: Self-pay

## 2020-12-26 ENCOUNTER — Ambulatory Visit (INDEPENDENT_AMBULATORY_CARE_PROVIDER_SITE_OTHER): Payer: Medicare Other | Admitting: Physician Assistant

## 2020-12-26 VITALS — Ht 73.0 in | Wt 269.0 lb

## 2020-12-26 DIAGNOSIS — Z96642 Presence of left artificial hip joint: Secondary | ICD-10-CM

## 2020-12-26 NOTE — Progress Notes (Signed)
Post-Op Visit Note   Patient: Curtis Hunt           Date of Birth: [birth year]           MRN: [id number] Visit Date: 12/26/2020 PCP: Susy Frizzle, MD   Assessment & Plan:  Chief Complaint:  Chief Complaint  Patient presents with   Left Hip - Follow-up    Left total hip arthroplasty 09/30/2020   Visit Diagnoses:  1. H/O total hip arthroplasty, left     Plan: Patient is a pleasant 70 year old gentleman who comes in today 3 months out left total hip replacement 09/30/2020.  He has been doing well.  He feels better now than he has in years.  Examination of the left hip reveals full range of motion and strength.  He is neurovascular intact distally.  At this point, he will continue to advance with activity as tolerated.  Dental prophylaxis reinforced.  Follow-up in 9 months for repeat evaluation and AP pelvis x-rays.  Call with concerns or questions.  Follow-Up Instructions: Return in about 9 months (around 09/25/2021).   Orders:  No orders of the defined types were placed in this encounter.  No orders of the defined types were placed in this encounter.   Imaging: No new imaging  PMFS History: Patient Active Problem List   Diagnosis Date Noted   Primary osteoarthritis of left hip 09/30/2020   Status post total replacement of left hip 09/30/2020   Change in bowel habit 07/03/2020   Colon cancer screening 07/03/2020   Diverticular disease of colon 07/03/2020   Hematochezia 07/03/2020   Hiatal hernia 07/03/2020   Iron deficiency anemia 07/03/2020   Morbid obesity ([place]) 07/03/2020   Personal history of colonic polyps 07/03/2020   Status post total replacement of right hip 12/18/2019   Malignant neoplasm of prostate ([place]) 04/29/2015   Rhabdomyolysis 06/09/2013   Dehydration 06/09/2013   Newly diagnosed diabetes ([place]) 06/09/2013   Dyslipidemia Q000111Q   Metabolic encephalopathy Q000111Q   Hypernatremia 06/09/2013   DKA (diabetic ketoacidoses) 06/08/2013   Weight  loss 06/08/2013   Oral thrush 06/08/2013   GERD (gastroesophageal reflux disease) 06/08/2013   Jerking movements of extremities 06/08/2013   Acute renal failure ([place]) 06/08/2013   Gout    Past Medical History:  Diagnosis Date   Arthritis    Cancer ([place]) 2004   Prostate CA with surgery   Diabetes mellitus without complication ([place])    Gout    Hyperlipidemia    Prostate cancer ([place])     Family History  Problem Relation Age of Onset   Cancer Brother        prostate   Cancer Mother        breast ca    Past Surgical History:  Procedure Laterality Date   JOINT REPLACEMENT Right 12/18/2019   total hip replacement w/ Dr. Erlinda Hong   PROSTATE SURGERY     TOTAL HIP ARTHROPLASTY Right 12/18/2019   Procedure: RIGHT TOTAL HIP ARTHROPLASTY ANTERIOR APPROACH;  Surgeon: Leandrew Koyanagi, MD;  Location: [facility];  Service: Orthopedics;  Laterality: Right;   TOTAL HIP ARTHROPLASTY Left 09/30/2020   Procedure: LEFT TOTAL HIP ARTHROPLASTY ANTERIOR APPROACH;  Surgeon: Leandrew Koyanagi, MD;  Location: [facility];  Service: Orthopedics;  Laterality: Left;   Social History   Occupational History   Not on file  Tobacco Use   Smoking status: Never   Smokeless tobacco: Never  Vaping Use   Vaping Use: Never used  Substance and  Sexual Activity   Alcohol use: Yes    Comment: Beer prior to diagnosis of diabetes, about once a week during sports on tv   Drug use: No   Sexual activity: Not Currently

## 2021-01-10 ENCOUNTER — Other Ambulatory Visit: Payer: Self-pay | Admitting: Family Medicine

## 2021-01-14 ENCOUNTER — Other Ambulatory Visit: Payer: Self-pay | Admitting: Family Medicine

## 2021-01-21 ENCOUNTER — Telehealth: Payer: Self-pay | Admitting: *Deleted

## 2021-01-21 NOTE — Telephone Encounter (Signed)
Ortho bundle call to patient to review status of 1 year for his Right THA and 90 day call to review status of his Left THA.

## 2021-02-05 ENCOUNTER — Telehealth: Payer: Self-pay | Admitting: Pharmacist

## 2021-02-05 NOTE — Progress Notes (Addendum)
    Chronic Care Management Pharmacy Assistant   Name: Curtis Hunt  MRN: [id number] DOB: [birth year]  Reason for Encounter: General Disease State Call   Conditions to be addressed/monitored: GERD, diabetes, gout, dyslipidemia  Recent office visits:  11/21/20 Dr. Dennard Schaumann For follow-up. STARTED Trazodone 50 mg at bedtime PRN. STOPPED Allopurinol, Docusate, Methocarbamol, Ondansetron, Oxycodone, and Sulfamethoxazole.   Recent consult visits:  12/26/20 Orthopedic Surgery Tiney Rouge, L, PA-C. For left hip replacement follow-up.No medication changes.  11/21/20 Orthopedic Leandrew Koyanagi, MD. For left hip replacement follow-up. STARTED Hydrocodone-Acetaminophen 5-325 mg 1 tablet daily PRN.   Hospital visits:  None since 10/25/20  Medications: Outpatient Encounter Medications as of 02/05/2021  Medication Sig Note   aspirin EC 81 MG tablet Take 1 tablet (81 mg total) by mouth 2 (two) times daily. To be taken after surgery    Blood Glucose Monitoring Suppl (ONE TOUCH ULTRA 2) w/Device KIT ONE TOUCH ULTRA 2 GLUCOSE SYSTEM W/ DEVICE KIT    Dulaglutide (TRULICITY) 1.5 HY/8.5OY SOPN Inject 1.5 mg as directed every Tuesday.    HYDROcodone-acetaminophen (NORCO) 5-325 MG tablet Take 1 tablet by mouth daily as needed.    Insulin Pen Needle (B-D UF III MINI PEN NEEDLES) 31G X 5 MM MISC USE AS DIRECTED TWICE A DAY    LEVEMIR FLEXTOUCH 100 UNIT/ML FlexPen INJECT 25 UNITS INTO THE SKIN DAILY AT 10 PM. (Patient taking differently: Inject 22 Units into the skin daily at 10 pm.) 09/30/2020: Pt took 10 units at bedtime   ONETOUCH ULTRA test strip CHECK SUGAR 4 TO 5 TIMES DAILY EVERY MORNING, AFTER MEALS, AND AT BEDTIME E11.65    rosuvastatin (CRESTOR) 40 MG tablet TAKE 1 TABLET BY MOUTH EVERY DAY    sildenafil (VIAGRA) 100 MG tablet Take 0.5-1 tablets (50-100 mg total) by mouth daily as needed for erectile dysfunction.    traZODone (DESYREL) 50 MG tablet Take 1 tablet (50 mg total) by mouth at bedtime as needed  for sleep.    No facility-administered encounter medications on file as of 02/05/2021.   GEN CALL: Patient stated he is doing great since his left hip surgery he stated he can dance now. He stated his appetite is good, he stated sometimes he might eat too much. He stated he does not have any questions or concerns about his medications at this time.   Care Gaps:Patient is due for his Diabetic eye exam.  Not on Gap and Adherence Report.   Star Rating Drugs: Rosuvastatin 40 mg 7/74/12 90 DS, Trulicity 1.$RemoveBeforeD'5mg'UpxbgqWmPYHtRj$ /0.36ml 01/19/21 28 DS.  Follow-Up:Pharmacist Review  Charlann Lange, [place] Pharmacist Assistant [phone]

## 2021-02-12 ENCOUNTER — Ambulatory Visit (INDEPENDENT_AMBULATORY_CARE_PROVIDER_SITE_OTHER): Payer: Medicare Other | Admitting: *Deleted

## 2021-02-12 ENCOUNTER — Other Ambulatory Visit: Payer: Self-pay

## 2021-02-12 DIAGNOSIS — Z23 Encounter for immunization: Secondary | ICD-10-CM | POA: Diagnosis not present

## 2021-03-30 ENCOUNTER — Other Ambulatory Visit: Payer: Self-pay | Admitting: Family Medicine

## 2021-03-31 NOTE — Telephone Encounter (Signed)
Pt's chart lists multiple dosings for Levemir. Please advise.   LEVEMIR FLEXTOUCH 100 UNIT/ML FlexPen INJECT 25 UNITS INTO THE SKIN DAILY AT 10 PM. (Patient taking differently: Inject 22 Units into the skin daily at 10 pm.) 09/30/2020: Pt took 10 units at bedtime

## 2021-05-08 ENCOUNTER — Other Ambulatory Visit: Payer: Self-pay | Admitting: Family Medicine

## 2021-06-19 ENCOUNTER — Ambulatory Visit (INDEPENDENT_AMBULATORY_CARE_PROVIDER_SITE_OTHER): Payer: Medicare Other

## 2021-06-19 ENCOUNTER — Other Ambulatory Visit: Payer: Self-pay

## 2021-06-19 VITALS — BP 136/72 | HR 84 | Ht 73.0 in | Wt 269.4 lb

## 2021-06-19 DIAGNOSIS — Z0000 Encounter for general adult medical examination without abnormal findings: Secondary | ICD-10-CM | POA: Diagnosis not present

## 2021-06-19 NOTE — Progress Notes (Signed)
Subjective:   MACALLAN ORD is a 71 y.o. male who presents for Medicare Annual/Subsequent preventive examination.  Review of Systems     Cardiac Risk Factors include: advanced age (>92mn, >>70women);diabetes mellitus;dyslipidemia;male gender;sedentary lifestyle;obesity (BMI >30kg/m2)  IN OFFICE VISIT AT [facility].    Objective:    Today's Vitals   06/19/21 0824  BP: 136/72  Pulse: 84  SpO2: 99%  Weight: 269 lb 6.4 oz (122.2 kg)  Height: 6' 1"  (1.854 m)   Body mass index is 35.54 kg/m.  Advanced Directives 06/19/2021 09/30/2020 09/26/2020 12/18/2019 12/14/2019 09/03/2015 04/29/2015  Does Patient Have a Medical Advance Directive? No No No No No No No  Would patient like information on creating a medical advance directive? No - Patient declined No - Patient declined No - Patient declined No - Patient declined - No - patient declined information No - patient declined information  Pre-existing out of facility DNR order (yellow form or pink MOST form) - - - - - - -    Current Medications (verified) Outpatient Encounter Medications as of 06/19/2021  Medication Sig   aspirin EC 81 MG tablet Take 1 tablet (81 mg total) by mouth 2 (two) times daily. To be taken after surgery   Blood Glucose Monitoring Suppl (ONE TOUCH ULTRA 2) w/Device KIT ONE TOUCH ULTRA 2 GLUCOSE SYSTEM W/ DEVICE KIT   HYDROcodone-acetaminophen (NORCO) 5-325 MG tablet Take 1 tablet by mouth daily as needed.   insulin detemir (LEVEMIR FLEXTOUCH) 100 UNIT/ML FlexPen Inject 22 Units into the skin daily at 10 pm.   Insulin Pen Needle (B-D UF III MINI PEN NEEDLES) 31G X 5 MM MISC USE AS DIRECTED TWICE A DAY   ONETOUCH ULTRA test strip CHECK SUGAR 4 TO 5 TIMES DAILY EVERY MORNING, AFTER MEALS, AND AT BEDTIME E11.65   rosuvastatin (CRESTOR) 40 MG tablet TAKE 1 TABLET BY MOUTH EVERY DAY   sildenafil (VIAGRA) 100 MG tablet Take 0.5-1 tablets (50-100 mg total) by mouth daily as needed for erectile dysfunction.   traZODone (DESYREL) 50  MG tablet Take 1 tablet (50 mg total) by mouth at bedtime as needed for sleep.   TRULICITY 1.5 MZO/1.0RUSOPN INJECT 1.5 MG AS DIRECTED EVERY TUESDAY.   No facility-administered encounter medications on file as of 06/19/2021.    Allergies (verified) Patient has no known allergies.   History: Past Medical History:  Diagnosis Date   Arthritis    Cancer ([place] 2004   Prostate CA with surgery   Diabetes mellitus without complication ([place]    Gout    Hyperlipidemia    Pro[facility]    Past Surgical History:  Procedure Laterality Date   JOINT REPLACEMENT Right 12/18/2019   total hip replacement w/ Dr. XErlinda Hong  PROSTATE SURGERY     TOTAL HIP ARTHROPLASTY Right 12/18/2019   Procedure: RIGHT TOTAL HIP ARTHROPLASTY ANTERIOR APPROACH;  Surgeon: XLeandrew Koyanagi MD;  [place] [facility]  Service: Orthopedics;  Laterality: Right;   TOTAL HIP ARTHROPLASTY Left 09/30/2020   Procedure: LEFT TOTAL HIP ARTHROPLASTY ANTERIOR APPROACH;  Surgeon: XLeandrew Koyanagi MD;  Location: [facility]  Service: Orthopedics;  Laterality: Left;   Family History  Problem Relation Age of Onset   Cancer Brother        prostate   Cancer Mother        breast ca   Social History   Socioeconomic History   Marital status: Married    Spouse name: Not on file   Number  of children: 3   Years of education: Not on file   Highest education level: Not on file  Occupational History   Not on file  Tobacco Use   Smoking status: Never   Smokeless tobacco: Never  Vaping Use   Vaping Use: Never used  Substance and Sexual Activity   Alcohol use: Yes    Comment: Beer prior to diagnosis of diabetes, about once a week during sports on tv   Drug use: No   Sexual activity: Not Currently  Other Topics Concern   Not on file  Social History Narrative   5 grandchildren and 2 great grandchildren.   1st wife died in 08-Aug-2015, married x 45 years.   Remarried 2nd wife 12/2020.    Social Determinants of Health   Financial Resource Strain:  Low Risk    Difficulty of Paying Living Expenses: Not hard at all  Food Insecurity: No Food Insecurity   Worried About Charity fundraiser in the Last Year: Never true   [place] in the Last Year: Never true  Transportation Needs: No Transportation Needs   Lack of Transportation (Medical): No   Lack of Transportation (Non-Medical): No  Physical Activity: Insufficiently Active   Days of Exercise per Week: 2 days   Minutes of Exercise per Session: 20 min  Stress: No Stress Concern Present   Feeling of Stress : Not at all  Social Connections: Socially Integrated   Frequency of Communication with Friends and Family: More than three times a week   Frequency of Social Gatherings with Friends and Family: More than three times a week   Attends Religious Services: More than 4 times per year   Active Member of [employer] or Organizations: Yes   Attends Music therapist: More than 4 times per year   Marital Status: Married    Tobacco Counseling Counseling given: Not Answered   Clinical Intake:  Pre-visit preparation completed: Yes  Pain : No/denies pain     BMI - recorded: 35.54 Nutritional Status: BMI > 30  Obese Nutritional Risks: None Diabetes: Yes  How often do you need to have someone help you when you read instructions, pamphlets, or other written materials from your doctor or pharmacy?: 1 - Never  Diabetic?Nutrition Risk Assessment:  Has the patient had any N/V/D within the last 2 months?  No  Does the patient have any non-healing wounds?  No  Has the patient had any unintentional weight loss or weight gain?  No   Diabetes:  Is the patient diabetic?  Yes  If diabetic, was a CBG obtained today?  No  Did the patient bring in their glucometer from home?  No  How often do you monitor your CBG's? Daily.   Financial Strains and Diabetes Management:  Are you having any financial strains with the device, your supplies or your medication? No .  Does the  patient want to be seen by Chronic Care Management for management of their diabetes?  No  Would the patient like to be referred to a Nutritionist or for Diabetic Management?  No   Diabetic Exams:  Diabetic Eye Exam: Completed Scheduled for 06/27/21. Pt has been advised about the importance in completing this exam.  Diabetic Foot Exam: Completed 03/27/2020. Pt has been advised about the importance in completing this exam.   Interpreter Needed?: No  Information entered by :: mj Huey Scalia, lpn   Activities of Daily Living In your present state of health, do you have  any difficulty performing the following activities: 06/19/2021 09/30/2020  Hearing? N N  Vision? N N  Difficulty concentrating or making decisions? N N  Walking or climbing stairs? N Y  Dressing or bathing? N Y  Doing errands, shopping? N N  Preparing Food and eating ? N -  Using the Toilet? N -  In the past six months, have you accidently leaked urine? [place] you have problems with loss of bowel control? N -  Managing your Medications? N -  Managing your Finances? N -  Housekeeping or managing your Housekeeping? N -  Some recent data might be hidden    Patient Care Team: Susy Frizzle, MD as PCP - General (Family Medicine) Edythe Clarity, [facility] as Pharmacist (Pharmacist)  Indicate any recent Medical Services you may have received from other than [facility] providers in the past year (date may be approximate).     Assessment:   This is a routine wellness examination for Ruby.  Hearing/Vision screen Hearing Screening - Comments:: No hearing issues  Vision Screening - Comments:: Readers. Dr. Kieth Brightly in [place] 06/27/2021.  Dietary issues and exercise activities discussed: Current Exercise Habits: Home exercise routine, Type of exercise: walking, Time (Minutes): 20, Frequency (Times/Week): 2, Weekly Exercise (Minutes/Week): 40, Intensity: Mild, Exercise limited by: cardiac condition(s)   Goals  Addressed             This Visit's Progress    Exercise 3x per week (30 min per time)       Increase exercise. Home improvements       Depression Screen PHQ 2/9 Scores 06/19/2021 10/25/2020 01/20/2019 09/24/2017 09/24/2017 09/21/2016 09/21/2016  PHQ - 2 Score 0 0 0 0 0 0 0  PHQ- 9 Score - - - - - - 0    Fall Risk Fall Risk  06/19/2021 10/25/2020 09/24/2017 09/24/2017 09/21/2016  Falls in the past year? 0 0 No No No  Number falls in past yr: 0 0 - - -  Injury with Fall? 0 0 - - -  Risk for fall due to : No Fall Risks No Fall Risks - - -  Follow up Falls prevention discussed Falls evaluation completed - - -    FALL RISK PREVENTION PERTAINING TO THE HOME:  Any stairs in or around the home? No  If so, are there any without handrails? No  Home free of loose throw rugs in walkways, pet beds, electrical cords, etc? No  Adequate lighting in your home to reduce risk of falls? No   ASSISTIVE DEVICES UTILIZED TO PREVENT FALLS:  Life alert? No  Use of a cane, walker or w/c? No  Grab bars in the bathroom? No  Shower chair or bench in shower?  Walk in shower Elevated toilet seat or a handicapped toilet? Yes   TIMED UP AND GO:  Was the test performed? Yes .  Length of time to ambulate 10 feet: 8 sec.   Gait steady and fast without use of assistive device  Cognitive Function:     6CIT Screen 06/19/2021  What Year? 0 points  What month? 0 points  What time? 0 points  Count back from 20 0 points  Months in reverse 0 points  Repeat phrase 0 points  Total Score 0    Immunizations Immunization History  Administered Date(s) Administered   Fluad Quad(high Dose 65+) 02/14/2019, 02/22/2020, 02/12/2021   Influenza Split 02/22/2013, 02/22/2014   Influenza-Unspecified 02/19/2015, 02/13/2016, 02/22/2017   PFIZER(Purple  Top)SARS-COV-2 Vaccination 06/19/2019, 07/10/2019, 04/05/2020   PPD Test 09/13/2012, 08/02/2013, 08/20/2014, 09/03/2015, 09/21/2016   Pfizer Covid-19 Vaccine Bivalent Booster  42yr & up 02/12/2021   Pneumococcal Conjugate-13 09/24/2017   Pneumococcal Polysaccharide-23 05/11/2014, 02/22/2020   Tdap 09/10/2010   Zoster, Live 09/13/2012    TDAP status: Due, Education has been provided regarding the importance of this vaccine. Advised may receive this vaccine at local pharmacy or Health Dept. Aware to provide a copy of the vaccination record if obtained from local pharmacy or Health Dept. Verbalized acceptance and understanding.  Flu Vaccine status: Up to date  Pneumococcal vaccine status: Up to date  Covid-19 vaccine status: Completed vaccines  Qualifies for Shingles Vaccine? Yes   Zostavax completed Yes   Shingrix Completed?: No.    Education has been provided regarding the importance of this vaccine. Patient has been advised to call insurance company to determine out of pocket expense if they have not yet received this vaccine. Advised may also receive vaccine at local pharmacy or Health Dept. Verbalized acceptance and understanding.  Screening Tests Health Maintenance  Topic Date Due   Zoster Vaccines- Shingrix (1 of 2) Never done   OPHTHALMOLOGY EXAM  10/20/2018   TETANUS/TDAP  09/09/2020   URINE MICROALBUMIN  02/21/2021   HEMOGLOBIN A1C  02/26/2021   FOOT EXAM  03/27/2021   COLONOSCOPY (Pts 45-424yrInsurance coverage will need to be confirmed)  01/01/2029   Pneumonia Vaccine 6560Years old  Completed   INFLUENZA VACCINE  Completed   COVID-19 Vaccine  Completed   Hepatitis C Screening  Completed   HPV VACCINES  Aged Out    Health Maintenance  Health Maintenance Due  Topic Date Due   Zoster Vaccines- Shingrix (1 of 2) Never done   OPHTHALMOLOGY EXAM  10/20/2018   TETANUS/TDAP  09/09/2020   URINE MICROALBUMIN  02/21/2021   HEMOGLOBIN A1C  02/26/2021   FOOT EXAM  03/27/2021    Colorectal cancer screening: Type of screening: Colonoscopy. Completed 01/02/2019. Repeat every 10 years  Lung Cancer Screening: (Low Dose CT Chest recommended if Age  72-80ears, 30 pack-year currently smoking OR have quit w/in 15years.) does not qualify.    Additional Screening:  Hepatitis C Screening: does qualify; Completed 09/21/2017  Vision Screening: Recommended annual ophthalmology exams for early detection of glaucoma and other disorders of the eye. Is the patient up to date with their annual eye exam?  Yes  Who is the provider or what is the name of the office in which the patient attends annual eye exams? Dr. [place]ing Plumen [place] pt is not established with a provider, would they like to be referred to a provider to establish care? No .   Dental Screening: Recommended annual dental exams for proper oral hygiene  Community Resource Referral / Chronic Care Management: CRR required this visit?  No   CCM required this visit?  No      Plan:     I have personally reviewed and noted the following in the patients chart:   Medical and social history Use of alcohol, tobacco or illicit drugs  Current medications and supplements including opioid prescriptions. Patient is not currently taking opioid prescriptions. Functional ability and status Nutritional status Physical activity Advanced directives List of other physicians Hospitalizations, surgeries, and ER visits in previous 12 months Vitals Screenings to include cognitive, depression, and falls Referrals and appointments  In addition, I have reviewed and discussed with patient certain preventive protocols, quality metrics, and best practice recommendations. A  written personalized care plan for preventive services as well as general preventive health recommendations were provided to patient.     Chriss Driver, LPN   2/82/0601   Nurse Notes: Pt is up to date on age appropriate health maintenance. Discussed Shingrix and tdap vaccines and how to obtain.

## 2021-06-19 NOTE — Patient Instructions (Signed)
Curtis Hunt , Thank you for taking time to come for your Medicare Wellness Visit. I appreciate your ongoing commitment to your health goals. Please review the following plan we discussed and let me know if I can assist you in the future.   Screening recommendations/referrals: Colonoscopy: Done 01/02/2019. Repeat in 10 years  Recommended yearly ophthalmology/optometry visit for glaucoma screening and checkup Recommended yearly dental visit for hygiene and checkup  Vaccinations: Influenza vaccine: Done 02/12/2021 Repeat annually  Pneumococcal vaccine: Done 09/24/2017 and 02/22/2020 Tdap vaccine: Done 09/10/2010 Repeat in 10 years  Shingles vaccine: Done 09/13/2012 Shingrix discussed. Please contact your pharmacy for coverage information.     Covid-19: Done 06/19/2019, 07/10/2019, 04/05/2020 and 02/12/2021.  Advanced directives: Advance directive discussed with you today. I have provided a copy for you to complete at home and have notarized. Once this is complete please bring a copy in to our office so we can scan it into your chart.   Conditions/risks identified: Aim for 30 minutes of exercise or brisk walking each day, drink 6-8 glasses of water and eat lots of fruits and vegetables.   Next appointment: Follow up in one year for your annual wellness visit. 2024  Preventive Care 65 Years and Older, Male  Preventive care refers to lifestyle choices and visits with your health care provider that can promote health and wellness. What does preventive care include? A yearly physical exam. This is also called an annual well check. Dental exams once or twice a year. Routine eye exams. Ask your health care provider how often you should have your eyes checked. Personal lifestyle choices, including: Daily care of your teeth and gums. Regular physical activity. Eating a healthy diet. Avoiding tobacco and drug use. Limiting alcohol use. Practicing safe sex. Taking low doses of aspirin every  day. Taking vitamin and mineral supplements as recommended by your health care provider. What happens during an annual well check? The services and screenings done by your health care provider during your annual well check will depend on your age, overall health, lifestyle risk factors, and family history of disease. Counseling  Your health care provider may ask you questions about your: Alcohol use. Tobacco use. Drug use. Emotional well-being. Home and relationship well-being. Sexual activity. Eating habits. History of falls. Memory and ability to understand (cognition). Work and work Statistician. Screening  You may have the following tests or measurements: Height, weight, and BMI. Blood pressure. Lipid and cholesterol levels. These may be checked every 5 years, or more frequently if you are over 70 years old. Skin check. Lung cancer screening. You may have this screening every year starting at age 66 if you have a 30-pack-year history of smoking and currently smoke or have quit within the past 15 years. Fecal occult blood test (FOBT) of the stool. You may have this test every year starting at age 71. Flexible sigmoidoscopy or colonoscopy. You may have a sigmoidoscopy every 5 years or a colonoscopy every 10 years starting at age 15. Prostate cancer screening. Recommendations will vary depending on your family history and other risks. Hepatitis C blood test. Hepatitis B blood test. Sexually transmitted disease (STD) testing. Diabetes screening. This is done by checking your blood sugar (glucose) after you have not eaten for a while (fasting). You may have this done every 1-3 years. Abdominal aortic aneurysm (AAA) screening. You may need this if you are a current or former smoker. Osteoporosis. You may be screened starting at age 40 if you are at high risk.  Talk with your health care provider about your test results, treatment options, and if necessary, the need for more  tests. Vaccines  Your health care provider may recommend certain vaccines, such as: Influenza vaccine. This is recommended every year. Tetanus, diphtheria, and acellular pertussis (Tdap, Td) vaccine. You may need a Td booster every 10 years. Zoster vaccine. You may need this after age 52. Pneumococcal 13-valent conjugate (PCV13) vaccine. One dose is recommended after age 11. Pneumococcal polysaccharide (PPSV23) vaccine. One dose is recommended after age 64. Talk to your health care provider about which screenings and vaccines you need and how often you need them. This information is not intended to replace advice given to you by your health care provider. Make sure you discuss any questions you have with your health care provider. Document Released: 06/07/2015 Document Revised: 01/29/2016 Document Reviewed: 03/12/2015 Elsevier Interactive Patient Education  2017 [place] Prevention in the Home Falls can cause injuries. They can happen to people of all ages. There are many things you can do to make your home safe and to help prevent falls. What can I do on the outside of my home? Regularly fix the edges of walkways and driveways and fix any cracks. Remove anything that might make you trip as you walk through a door, such as a raised step or threshold. Trim any bushes or trees on the path to your home. Use bright outdoor lighting. Clear any walking paths of anything that might make someone trip, such as rocks or tools. Regularly check to see if handrails are loose or broken. Make sure that both sides of any steps have handrails. Any raised decks and porches should have guardrails on the edges. Have any leaves, snow, or ice cleared regularly. Use sand or salt on walking paths during winter. Clean up any spills in your garage right away. This includes oil or grease spills. What can I do in the bathroom? Use night lights. Install grab bars by the toilet and in the tub and shower.  Do not use towel bars as grab bars. Use non-skid mats or decals in the tub or shower. If you need to sit down in the shower, use a plastic, non-slip stool. Keep the floor dry. Clean up any water that spills on the floor as soon as it happens. Remove soap buildup in the tub or shower regularly. Attach bath mats securely with double-sided non-slip rug tape. Do not have throw rugs and other things on the floor that can make you trip. What can I do in the bedroom? Use night lights. Make sure that you have a light by your bed that is easy to reach. Do not use any sheets or blankets that are too big for your bed. They should not hang down onto the floor. Have a firm chair that has side arms. You can use this for support while you get dressed. Do not have throw rugs and other things on the floor that can make you trip. What can I do in the kitchen? Clean up any spills right away. Avoid walking on wet floors. Keep items that you use a lot in easy-to-reach places. If you need to reach something above you, use a strong step stool that has a grab bar. Keep electrical cords out of the way. Do not use floor polish or wax that makes floors slippery. If you must use wax, use non-skid floor wax. Do not have throw rugs and other things on the floor that can make  you trip. What can I do with my stairs? Do not leave any items on the stairs. Make sure that there are handrails on both sides of the stairs and use them. Fix handrails that are broken or loose. Make sure that handrails are as long as the stairways. Check any carpeting to make sure that it is firmly attached to the stairs. Fix any carpet that is loose or worn. Avoid having throw rugs at the top or bottom of the stairs. If you do have throw rugs, attach them to the floor with carpet tape. Make sure that you have a light switch at the top of the stairs and the bottom of the stairs. If you do not have them, ask someone to add them for you. What else  can I do to help prevent falls? Wear shoes that: Do not have high heels. Have rubber bottoms. Are comfortable and fit you well. Are closed at the toe. Do not wear sandals. If you use a stepladder: Make sure that it is fully opened. Do not climb a closed stepladder. Make sure that both sides of the stepladder are locked into place. Ask someone to hold it for you, if possible. Clearly mark and make sure that you can see: Any grab bars or handrails. First and last steps. Where the edge of each step is. Use tools that help you move around (mobility aids) if they are needed. These include: Canes. Walkers. Scooters. Crutches. Turn on the lights when you go into a dark area. Replace any light bulbs as soon as they burn out. Set up your furniture so you have a clear path. Avoid moving your furniture around. If any of your floors are uneven, fix them. If there are any pets around you, be aware of where they are. Review your medicines with your doctor. Some medicines can make you feel dizzy. This can increase your chance of falling. Ask your doctor what other things that you can do to help prevent falls. This information is not intended to replace advice given to you by your health care provider. Make sure you discuss any questions you have with your health care provider. Document Released: 03/07/2009 Document Revised: 10/17/2015 Document Reviewed: 06/15/2014 Elsevier Interactive Patient Education  2017 [employer].

## 2021-06-27 DIAGNOSIS — E119 Type 2 diabetes mellitus without complications: Secondary | ICD-10-CM | POA: Diagnosis not present

## 2021-06-27 DIAGNOSIS — H5203 Hypermetropia, bilateral: Secondary | ICD-10-CM | POA: Diagnosis not present

## 2021-06-27 DIAGNOSIS — H2513 Age-related nuclear cataract, bilateral: Secondary | ICD-10-CM | POA: Diagnosis not present

## 2021-06-27 DIAGNOSIS — H43811 Vitreous degeneration, right eye: Secondary | ICD-10-CM | POA: Diagnosis not present

## 2021-07-03 ENCOUNTER — Other Ambulatory Visit: Payer: Self-pay

## 2021-07-03 ENCOUNTER — Other Ambulatory Visit: Payer: Self-pay | Admitting: Family Medicine

## 2021-07-03 MED ORDER — LEVEMIR FLEXTOUCH 100 UNIT/ML ~~LOC~~ SOPN: 22.0000 [IU] | PEN_INJECTOR | Freq: Every day | SUBCUTANEOUS | 3 refills | Status: DC

## 2021-09-02 ENCOUNTER — Telehealth: Payer: Self-pay | Admitting: Pharmacist

## 2021-09-02 NOTE — Progress Notes (Signed)
Erroneous Encounter

## 2021-09-02 NOTE — Progress Notes (Signed)
? ? ?  Chronic Care Management ?Pharmacy Assistant  ? ?Name: Curtis Hunt  MRN: [id number] DOB: 05-Mar-1951 ? ?Reason for Encounter: General Adherence Call ?  ? ?Recent office visits:  ?None ? ?Recent consult visits:  ?None ? ?Hospital visits:  ?None in previous 6 months ? ?Medications: ?Outpatient Encounter Medications as of 09/02/2021  ?Medication Sig  ? aspirin EC 81 MG tablet Take 1 tablet (81 mg total) by mouth 2 (two) times daily. To be taken after surgery  ? Blood Glucose Monitoring Suppl (ONE TOUCH ULTRA 2) w/Device KIT ONE TOUCH ULTRA 2 GLUCOSE SYSTEM W/ DEVICE KIT  ? HYDROcodone-acetaminophen (NORCO) 5-325 MG tablet Take 1 tablet by mouth daily as needed.  ? insulin detemir (LEVEMIR FLEXPEN) 100 UNIT/ML FlexPen Inject 22U daily at 10pm  ? Insulin Pen Needle (B-D UF III MINI PEN NEEDLES) 31G X 5 MM MISC USE AS DIRECTED TWICE A DAY  ? ONETOUCH ULTRA test strip CHECK SUGAR 4 TO 5 TIMES DAILY EVERY MORNING, AFTER MEALS, AND AT BEDTIME E11.65  ? rosuvastatin (CRESTOR) 40 MG tablet TAKE 1 TABLET BY MOUTH EVERY DAY  ? sildenafil (VIAGRA) 100 MG tablet Take 0.5-1 tablets (50-100 mg total) by mouth daily as needed for erectile dysfunction.  ? traZODone (DESYREL) 50 MG tablet Take 1 tablet (50 mg total) by mouth at bedtime as needed for sleep.  ? TRULICITY 1.5 JE/5.6DJ SOPN INJECT 1.5 MG AS DIRECTED EVERY TUESDAY.  ? ?No facility-administered encounter medications on file as of 09/02/2021.  ? ?Patient Questions: ?Have you had any problems recently with your health? ?Patient denies having any recent problems with his health. ? ?Have you had any problems with your pharmacy? ?Patient denies having any problems with his pharmacy. ? ?What issues or side effects are you having with your medications? ?Patient denies having any issues or side effects with any of his medications. ? ?What would you like me to pass along to Leata Mouse, CPP for him to help you with?  ?Patient states he has not ran out of medication Rosuvastatin.  He states he is due for a refill and plans to have it refilled soon. ? ?What can we do to take care of you better? ?Patient does not have any suggestions. ? ?Care Gaps: ?Medicare Annual Wellness: Completed 06/19/2021 ?Ophthalmology Exam: Overdue since 10/20/2018 ?Foot Exam: Overdue since 03/27/2021 ?Hemoglobin A1C: 6.4% on 08/27/2020 ?Colonoscopy: Next due on 01/01/2029 ? ?Future Appointments  ?Date Time Provider [place]  ?06/25/2022  8:15 AM [facility]-NURSE HEALTH ADVISOR [facility]  ? ?Star Rating Drugs: ?Trulicity last filled 49/70/2637 28 DS ?Levemir Flexpen last filled 07/04/2021 68 DS ?Rosuvastatin last filled 04/18/2021 90 DS ? ?April D Calhoun, [place] ?Clinical Pharmacist Assistant ?[phone] ?

## 2021-10-03 ENCOUNTER — Other Ambulatory Visit: Payer: Self-pay | Admitting: Family Medicine

## 2021-10-06 NOTE — Telephone Encounter (Signed)
Requested medication (s) are due for refill today: yes ? ?Requested medication (s) are on the active medication list: yes ? ?Last refill:  05/13/21 #1.33m/5 ? ?Future visit scheduled: yes ? ?Notes to clinic:  pt is overdue for an appt and labs. Please advise for refill ? ? ?  ?Requested Prescriptions  ?Pending Prescriptions Disp Refills  ? TRULICITY 1.5 MYI/5.0YDSOPN [Pharmacy Med Name: TRULICITY 1.5 MXA/1.2ML PEN]  5  ?  Sig: INJECT 1.5 MG AS DIRECTED EVERY TUESDAY.  ?  ? Endocrinology:  Diabetes - GLP-1 Receptor Agonists Failed - 10/03/2021  5:39 PM  ?  ?  Failed - HBA1C is between 0 and 7.9 and within 180 days  ?  Hgb A1c MFr Bld  ?Date Value Ref Range Status  ?08/27/2020 6.4 (H) <5.7 % of total Hgb Final  ?  Comment:  ?  For someone without known diabetes, a hemoglobin  ?A1c value between 5.7% and 6.4% is consistent with ?prediabetes and should be confirmed with a  ?follow-up test. ?. ?For someone with known diabetes, a value <7% ?indicates that their diabetes is well controlled. A1c ?targets should be individualized based on duration of ?diabetes, age, comorbid conditions, and other ?considerations. ?. ?This assay result is consistent with an increased risk ?of diabetes. ?. ?Currently, no consensus exists regarding use of ?hemoglobin A1c for diagnosis of diabetes for children. ?. ?  ?   ?  ?  Failed - Valid encounter within last 6 months  ?  Recent Outpatient Visits   ? ?      ? 11 months ago Controlled type 2 diabetes mellitus without complication, with long-term current use of insulin ([place]  ? [facility] WC[facility]ie Mcgee [facility]  ? 2 years ago Right hip pain  [facility] Mc[facility]  ? 2 years ago Routine general medical examination at a health care facility  [facility]  ? 3 years ago Controlled type 2 diabetes mellitus without complication, with long-term current use of insulin ([place]  ? [facility],  WCammie Mcgee MD  ? 4 years ago Routine general medical examination at a health care facility  [facility]  ? ?  ?  ? ? ?  ?  ?  ? ?

## 2021-10-23 ENCOUNTER — Ambulatory Visit (INDEPENDENT_AMBULATORY_CARE_PROVIDER_SITE_OTHER): Payer: Medicare Other | Admitting: Family Medicine

## 2021-10-23 VITALS — BP 142/90 | HR 62 | Temp 97.8°F | Ht 73.0 in | Wt 271.1 lb

## 2021-10-23 DIAGNOSIS — Z794 Long term (current) use of insulin: Secondary | ICD-10-CM | POA: Diagnosis not present

## 2021-10-23 DIAGNOSIS — E119 Type 2 diabetes mellitus without complications: Secondary | ICD-10-CM

## 2021-10-23 DIAGNOSIS — E785 Hyperlipidemia, unspecified: Secondary | ICD-10-CM | POA: Diagnosis not present

## 2021-10-23 DIAGNOSIS — Z86010 Personal history of colonic polyps: Secondary | ICD-10-CM

## 2021-10-23 DIAGNOSIS — Z0000 Encounter for general adult medical examination without abnormal findings: Secondary | ICD-10-CM | POA: Diagnosis not present

## 2021-10-23 MED ORDER — LOSARTAN POTASSIUM 50 MG PO TABS: 50.0000 mg | ORAL_TABLET | Freq: Every day | ORAL | 3 refills | Status: DC

## 2021-10-23 NOTE — Progress Notes (Signed)
Subjective:    Patient ID: Curtis Hunt, male    DOB: [birth year], 71 y.o.   MRN: [id number]  HPI Patient is a very pleasant 71 year old African-American gentleman here today for a physical exam.  His last colonoscopy was in 2020 and I saw 3 polyps.  They recommended a repeat colonoscopy in 7 years.  He denies any melena or hematochezia.  His PSA is monitored by his urologist.  This was done in the last 12 months and it was undetectable.  He sees his ophthalmologist once a year and has no retinopathy.  His blood pressure today is borderline and has been seeing blood pressure similar to that at home.  He denies any chest pain shortness of breath or dyspnea on exertion.  He denies any neuropathy in his feet. Immunization History  Administered Date(s) Administered   Fluad Quad(high Dose 65+) 02/14/2019, 02/22/2020, 02/12/2021   Influenza Split 02/22/2013, 02/22/2014   Influenza-Unspecified 02/19/2015, 02/13/2016, 02/22/2017   PFIZER(Purple Top)SARS-COV-2 Vaccination 06/19/2019, 07/10/2019, 72/04/2020   PPD Test 09/13/2012, 08/02/2013, 08/20/2014, 09/03/2015, 09/21/2016   Pfizer Covid-19 Vaccine Bivalent Booster 72yr & up 02/12/2021   Pneumococcal Conjugate-13 09/24/2017   Pneumococcal Polysaccharide-23 05/11/2014, 02/22/2020   Tdap 09/10/2010   Zoster, Live 09/13/2012     Past Medical History:  Diagnosis Date   Arthritis    [place]   Prostate CA with surgery   Diabetes mellitus without complication ([place]    Gout    Hyperlipidemia    Prostate cancer ([facility]    Past Surgical History:  Procedure Laterality Date   JOINT REPLACEMENT Right 12/18/2019   total hip replacement w/ Dr. XErlinda Hong  PROSTATE SURGERY     TOTAL HIP ARTHROPLASTY Right 12/18/2019   Procedure: RIGHT TOTAL HIP ARTHROPLASTY ANTERIOR APPROACH;  Surgeon: XLeandrew Koyanagi MD;  [place]  Service: Orthopedics;  Laterality: Right;   TOTAL HIP ARTHROPLASTY Left 09/30/2020   Procedure: LEFT TOTAL HIP ARTHROPLASTY  ANTERIOR APPROACH;  Surgeon: XLeandrew Koyanagi MD;  [place]  Service: Orthopedics;  Laterality: Left;   Current Outpatient Medications on File Prior to Visit  Medication Sig Dispense Refill   aspirin EC 81 MG tablet Take 1 tablet (81 mg total) by mouth 2 (two) times daily. To be taken after surgery 84 tablet 0   Blood Glucose Monitoring Suppl (ONE TOUCH ULTRA 2) w/Device KIT ONE TOUCH ULTRA 2 GLUCOSE SYSTEM W/ DEVICE KIT 1 kit 0   insulin detemir (LEVEMIR FLEXPEN) 100 UNIT/ML FlexPen Inject 22U daily at 10pm 15 mL 11   Insulin Pen Needle (B-D UF III MINI PEN NEEDLES) 31G X 5 MM MISC USE AS DIRECTED TWICE A DAY 100 each 3   ONETOUCH ULTRA test strip CHECK SUGAR 4 TO 5 TIMES DAILY EVERY MORNING, AFTER MEALS, AND AT BEDTIME E11.65 100 strip 11   rosuvastatin (CRESTOR) 40 MG tablet TAKE 1 TABLET BY MOUTH EVERY DAY 90 tablet 1   sildenafil (VIAGRA) 100 MG tablet Take 0.5-1 tablets (50-100 mg total) by mouth daily as needed for erectile dysfunction. 5 tablet 11   traZODone (DESYREL) 50 MG tablet Take 1 tablet (50 mg total) by mouth at bedtime as needed for sleep. 30 tablet 2   TRULICITY 1.5 MAY/3.0ZSSOPN INJECT 1.5 MG AS DIRECTED EVERY TUESDAY. 1.5 mL 1   HYDROcodone-acetaminophen (NORCO) 5-325 MG tablet Take 1 tablet by mouth daily as needed. (Patient not taking: Reported on 10/23/2021) 20 tablet 0   No current facility-administered medications on file prior  to visit.   No Known Allergies Social History   Socioeconomic History   Marital status: Married    Spouse name: Not on file   Number of children: 3   Years of education: Not on file   Highest education level: Not on file  Occupational History   Not on file  Tobacco Use   Smoking status: Never   Smokeless tobacco: Never  Vaping Use   Vaping Use: Never used  Substance and Sexual Activity   Alcohol use: Yes    Comment: Beer prior to diagnosis of diabetes, about once a week during sports on tv   Drug use: No   Sexual activity: Not  Currently  Other Topics Concern   Not on file  Social History Narrative   5 grandchildren and 2 great grandchildren.   1st wife died in 08/19/2015, married x 45 years.   Remarried 2nd wife 12/2020.    Social Determinants of Health   Financial Resource Strain: Low Risk    Difficulty of Paying Living Expenses: Not hard at all  Food Insecurity: No Food Insecurity   Worried About Charity fundraiser in the Last Year: Never true   [place] in the Last Year: Never true  Transportation Needs: No Transportation Needs   Lack of Transportation (Medical): No   Lack of Transportation (Non-Medical): No  Physical Activity: Insufficiently Active   Days of Exercise per Week: 2 days   Minutes of Exercise per Session: 20 min  Stress: No Stress Concern Present   Feeling of Stress : Not at all  Social Connections: Socially Integrated   Frequency of Communication with Friends and Family: More than three times a week   Frequency of Social Gatherings with Friends and Family: More than three times a week   Attends Religious Services: More than 4 times per year   Active Member of [employer] or Organizations: Yes   Attends Music therapist: More than 4 times per year   Marital Status: Married  Human resources officer Violence: Not At Risk   Fear of Current or Ex-Partner: No   Emotionally Abused: No   Physically Abused: No   Sexually Abused: No   Family History  Problem Relation Age of Onset   Cancer Brother        prostate   Cancer Mother        breast ca     Review of Systems  All other systems reviewed and are negative.     Objective:   Physical Exam Vitals reviewed.  Constitutional:      General: He is not in acute distress.    Appearance: He is well-developed. He is not diaphoretic.  HENT:     Head: Normocephalic and atraumatic.     Right Ear: External ear normal.     Left Ear: External ear normal.     Nose: Nose normal.     Mouth/Throat:     Pharynx: No oropharyngeal exudate.   Eyes:     General: No scleral icterus.       Right eye: No discharge.        Left eye: No discharge.     Conjunctiva/sclera: Conjunctivae normal.     Pupils: Pupils are equal, round, and reactive to light.  Neck:     Thyroid: No thyromegaly.     Vascular: No JVD.     Trachea: No tracheal deviation.  Cardiovascular:     Rate and Rhythm: Normal rate and regular rhythm.  Heart sounds: Normal heart sounds. No murmur heard.   No friction rub. No gallop.  Pulmonary:     Effort: Pulmonary effort is normal. No respiratory distress.     Breath sounds: Normal breath sounds. No stridor. No wheezing or rales.  Chest:     Chest wall: No tenderness.  Abdominal:     General: Bowel sounds are normal. There is no distension.     Palpations: Abdomen is soft. There is no mass.     Tenderness: There is no abdominal tenderness. There is no guarding or rebound.     Hernia: No hernia is present.  Musculoskeletal:        General: No tenderness or deformity. Normal range of motion.     Cervical back: Normal range of motion and neck supple.  Lymphadenopathy:     Cervical: No cervical adenopathy.  Skin:    General: Skin is warm.     Coloration: Skin is not pale.     Findings: No erythema or rash.  Neurological:     Mental Status: He is alert and oriented to person, place, and time.     Cranial Nerves: No cranial nerve deficit.     Sensory: No sensory deficit.     Motor: No abnormal muscle tone.     Coordination: Coordination normal.     Deep Tendon Reflexes: Reflexes normal.  Psychiatric:        Behavior: Behavior normal.        Thought Content: Thought content normal.        Judgment: Judgment normal.          Assessment & Plan:  Encounter for Medicare annual wellness exam - Plan: CBC with Differential/Platelet, Lipid panel, Microalbumin, urine, COMPLETE METABOLIC PANEL WITH GFR, Hemoglobin A1c, CANCELED: PSA  Controlled type 2 diabetes mellitus without complication, with long-term  current use of insulin (HCC) - Plan: CBC with Differential/Platelet, Lipid panel, Microalbumin, urine, COMPLETE METABOLIC PANEL WITH GFR, CANCELED: PSA  Dyslipidemia - Plan: CBC with Differential/Platelet, Lipid panel, Microalbumin, urine, COMPLETE METABOLIC PANEL WITH GFR, CANCELED: PSA  Personal history of colonic polyps  Blood pressure is mildly elevated.  Add losartan 50 mg a day to help lower his risk of renal damage from diabetes and manage his blood pressure.  Check CBC CMP and lipid panel.  Goal LDL cholesterol is less than 100.  Check a urine microalbumin to creatinine ratio.  Goal ratio is less than 30.  His prostate cancer is being monitored by his urologist who is checking a PSA.  Check a fasting lipid panel to monitor his dyslipidemia.  Colonoscopy is up-to-date.  I recommended Shingrix.  I recommended a flu shot in the fall.  Patient declines a tetanus shot today.  Pneumonia vaccines are up-to-date.  He denies any falls or depression or memory loss

## 2021-10-24 LAB — COMPLETE METABOLIC PANEL WITH GFR
AG Ratio: 1.3 (calc) (ref 1.0–2.5)
ALT: 12 U/L (ref 9–46)
AST: 16 U/L (ref 10–35)
Albumin: 4.1 g/dL (ref 3.6–5.1)
Alkaline phosphatase (APISO): 61 U/L (ref 35–144)
BUN: 21 mg/dL (ref 7–25)
CO2: 24 mmol/L (ref 20–32)
Calcium: 9.7 mg/dL (ref 8.6–10.3)
Chloride: 105 mmol/L (ref 98–110)
Creat: 1.12 mg/dL (ref 0.70–1.28)
Globulin: 3.1 g/dL (calc) (ref 1.9–3.7)
Glucose, Bld: 102 mg/dL — ABNORMAL HIGH (ref 65–99)
Potassium: 4.5 mmol/L (ref 3.5–5.3)
Sodium: 141 mmol/L (ref 135–146)
Total Bilirubin: 0.2 mg/dL (ref 0.2–1.2)
Total Protein: 7.2 g/dL (ref 6.1–8.1)
eGFR: 70 mL/min/{1.73_m2} (ref >=60)

## 2021-10-24 LAB — CBC WITH DIFFERENTIAL/PLATELET
Absolute Monocytes: 499 cells/uL (ref 200–950)
Basophils Absolute: 120 cells/uL (ref 0–200)
Basophils Relative: 2.3 %
Eosinophils Absolute: 229 cells/uL (ref 15–500)
Eosinophils Relative: 4.4 %
HCT: 40.5 % (ref 38.5–50.0)
Hemoglobin: 13.1 g/dL — ABNORMAL LOW (ref 13.2–17.1)
Lymphs Abs: 1867 cells/uL (ref 850–3900)
MCH: 28.7 pg (ref 27.0–33.0)
MCHC: 32.3 g/dL (ref 32.0–36.0)
MCV: 88.6 fL (ref 80.0–100.0)
MPV: 10.7 fL (ref 7.5–12.5)
Monocytes Relative: 9.6 %
Neutro Abs: 2486 cells/uL (ref 1500–7800)
Neutrophils Relative %: 47.8 %
Platelets: 255 10*3/uL (ref 140–400)
RBC: 4.57 10*6/uL (ref 4.20–5.80)
RDW: 14.8 % (ref 11.0–15.0)
Total Lymphocyte: 35.9 %
WBC: 5.2 10*3/uL (ref 3.8–10.8)

## 2021-10-24 LAB — MICROALBUMIN, URINE: Microalb, Ur: 4.7 mg/dL

## 2021-10-24 LAB — LIPID PANEL
Cholesterol: 150 mg/dL (ref <200)
HDL: 49 mg/dL (ref >=40)
LDL Cholesterol (Calc): 79 mg/dL (calc)
Non-HDL Cholesterol (Calc): 101 mg/dL (calc) (ref <130)
Total CHOL/HDL Ratio: 3.1 (calc) (ref <5.0)
Triglycerides: 128 mg/dL (ref <150)

## 2021-10-24 LAB — HEMOGLOBIN A1C
Hgb A1c MFr Bld: 6.5 % of total Hgb — ABNORMAL HIGH (ref <5.7)
Mean Plasma Glucose: 140 mg/dL
eAG (mmol/L): 7.7 mmol/L

## 2021-11-03 ENCOUNTER — Other Ambulatory Visit: Payer: Self-pay

## 2021-11-03 ENCOUNTER — Telehealth: Payer: Self-pay | Admitting: Pharmacist

## 2021-11-03 DIAGNOSIS — R7612 Nonspecific reaction to cell mediated immunity measurement of gamma interferon antigen response without active tuberculosis: Secondary | ICD-10-CM

## 2021-11-03 NOTE — Progress Notes (Signed)
Chronic Care Management Pharmacy Assistant   Name: Curtis Hunt  MRN: [id number] DOB: [birth year]   Reason for Encounter: Disease State - Diabetes Call    Recent office visits:  10/23/21 Annual Medicare Wellness Completed   Recent consult visits:  None noted.  Hospital visits:  None in previous 6 months  Medications: Outpatient Encounter Medications as of 11/03/2021  Medication Sig   aspirin EC 81 MG tablet Take 1 tablet (81 mg total) by mouth 2 (two) times daily. To be taken after surgery   Blood Glucose Monitoring Suppl (ONE TOUCH ULTRA 2) w/Device KIT ONE TOUCH ULTRA 2 GLUCOSE SYSTEM W/ DEVICE KIT   HYDROcodone-acetaminophen (NORCO) 5-325 MG tablet Take 1 tablet by mouth daily as needed. (Patient not taking: Reported on 10/23/2021)   insulin detemir (LEVEMIR FLEXPEN) 100 UNIT/ML FlexPen Inject 22U daily at 10pm   Insulin Pen Needle (B-D UF III MINI PEN NEEDLES) 31G X 5 MM MISC USE AS DIRECTED TWICE A DAY   losartan (COZAAR) 50 MG tablet Take 1 tablet (50 mg total) by mouth daily.   ONETOUCH ULTRA test strip CHECK SUGAR 4 TO 5 TIMES DAILY EVERY MORNING, AFTER MEALS, AND AT BEDTIME E11.65   rosuvastatin (CRESTOR) 40 MG tablet TAKE 1 TABLET BY MOUTH EVERY DAY   sildenafil (VIAGRA) 100 MG tablet Take 0.5-1 tablets (50-100 mg total) by mouth daily as needed for erectile dysfunction.   traZODone (DESYREL) 50 MG tablet Take 1 tablet (50 mg total) by mouth at bedtime as needed for sleep.   TRULICITY 1.5 XB/2.6OM SOPN INJECT 1.5 MG AS DIRECTED EVERY TUESDAY.   No facility-administered encounter medications on file as of 11/03/2021.    Current antihyperglycemic regimen:  TRULICITY 1.5 BT/5.9RC SOPN insulin detemir (LEVEMIR FLEXPEN) 100 UNIT/ML FlexPen   What recent interventions/DTPs have been made to improve glycemic control:  Patient reported no recent changes in his current medication regimen.  Have there been any recent hospitalizations or ED visits since last visit with CPP?   Patient has not had any hospitalizations or ED visits since last visit with CPP.  Patient denies hypoglycemic symptoms, including Pale, Sweaty, Shaky, Hungry, Nervous/irritable, and Vision changes   Patient denies hyperglycemic symptoms, including blurry vision, excessive thirst, fatigue, polyuria, and weakness   How often are you checking your blood sugar? Patient reported checking blood sugars   What are your blood sugars ranging?  Fasting: 92 (this am) Before meals:  After meals:  Bedtime:   During the week, how often does your blood glucose drop below 70?  Patient reported he has not had any readings below 70.  Are you checking your feet daily/regularly? Patient reported he keeps a check on his feet and gets a pedicure regularly.     Adherence Review: Is the patient currently on a STATIN medication? Yes Is the patient currently on ACE/ARB medication? Yes Does the patient have >5 day gap between last estimated fill dates? No   Care Gaps  AWV: done 10/23/21 Colonoscopy:  due 01/01/29 DM Eye Exam: overdue 10/20/18 DM Foot Exam: done 10/23/21 Microalbumin: done 10/23/21 HbgAIC: 08/27/20 (6.4) DEXA: N/A Mammogram: N/A   Star Rating Drugs: Trulicity last filled 16/38/45 28 days Levemir Flexpen last filled 09/13/21 68 days Rosuvastatin last filled 10/02/21 90 days Losartan 50 mg - last filled 10/23/21 90 days    Future Appointments  Date Time Provider [place]  11/04/2021  8:15 AM [facility]BSUMMIT LAB [facility]  06/25/2022  8:15 AM [facility]-NURSE HEALTH ADVISOR [facility]  Liza Showfety, CCMA Clinical Pharmacist Assistant  [phone]   

## 2021-11-04 ENCOUNTER — Other Ambulatory Visit: Payer: Medicare Other

## 2021-11-04 ENCOUNTER — Other Ambulatory Visit: Payer: Self-pay

## 2021-11-04 DIAGNOSIS — R7612 Nonspecific reaction to cell mediated immunity measurement of gamma interferon antigen response without active tuberculosis: Secondary | ICD-10-CM

## 2021-11-08 LAB — QUANTIFERON-TB GOLD PLUS
Mitogen-NIL: >10 IU/mL
NIL: 0.22 IU/mL
QuantiFERON-TB Gold Plus: NEGATIVE
TB1-NIL: 0 IU/mL
TB2-NIL: <0 IU/mL

## 2021-11-27 ENCOUNTER — Other Ambulatory Visit: Payer: Self-pay | Admitting: Family Medicine

## 2021-11-28 NOTE — Telephone Encounter (Signed)
After review pt chart, pt last OV was 10/23/21. Pt meets protocol, will refill.  Requested Prescriptions  Pending Prescriptions Disp Refills  . sildenafil (VIAGRA) 100 MG tablet [Pharmacy Med Name: SILDENAFIL 100 MG TABLET] 4 tablet 14    Sig: TAKE 0.5-1 TABLETS BY MOUTH DAILY AS NEEDED FOR ERECTILE DYSFUNCTION.     Urology: Erectile Dysfunction Agents Failed - 11/27/2021  2:23 PM      Failed - Last BP in normal range    BP Readings from Last 1 Encounters:  10/23/21 (!) 142/90         Failed - Valid encounter within last 12 months    Recent Outpatient Visits          1 month ago Encounter for [employer] annual wellness exam   [place] Susy Frizzle, MD   1 year ago Controlled type 2 diabetes mellitus without complication, with long-term current use of insulin ([place])   [place] Dennard Schaumann, [name], MD   2 years ago Right hip pain   [place] Dennard Schaumann, [name], MD   2 years ago Routine general medical examination at a health care facility   [place], [name], MD   3 years ago Controlled type 2 diabetes mellitus without complication, with long-term current use of insulin ([place])   [place] Pickard, [name], MD             Passed - AST in normal range and within 360 days    AST  Date Value Ref Range Status  10/23/2021 16 10 - 35 U/L Final         Passed - ALT in normal range and within 360 days    ALT  Date Value Ref Range Status  10/23/2021 12 9 - 46 U/L Final

## 2021-12-16 ENCOUNTER — Other Ambulatory Visit: Payer: Self-pay

## 2021-12-16 MED ORDER — ONETOUCH ULTRA VI STRP: ORAL_STRIP | 11 refills | Status: AC

## 2021-12-18 ENCOUNTER — Encounter: Payer: Self-pay | Admitting: Orthopaedic Surgery

## 2021-12-18 ENCOUNTER — Telehealth: Payer: Self-pay | Admitting: *Deleted

## 2021-12-18 ENCOUNTER — Ambulatory Visit: Payer: Medicare Other | Admitting: Orthopaedic Surgery

## 2021-12-18 ENCOUNTER — Ambulatory Visit (INDEPENDENT_AMBULATORY_CARE_PROVIDER_SITE_OTHER): Payer: Medicare Other

## 2021-12-18 DIAGNOSIS — M25512 Pain in left shoulder: Secondary | ICD-10-CM | POA: Diagnosis not present

## 2021-12-18 DIAGNOSIS — G8929 Other chronic pain: Secondary | ICD-10-CM

## 2021-12-18 NOTE — Progress Notes (Signed)
Office Visit Note   Patient: Curtis Hunt           Date of Birth: 1951/02/19           MRN: [id number] Visit Date: 12/18/2021              Requested by: Susy Frizzle, MD [address] [place],  [place] [address] PCP: Susy Frizzle, MD   Assessment & Plan: Visit Diagnoses:  1. Chronic left shoulder pain     Plan: Impression is left shoulder glenohumeral osteoarthritis.  Will make referral to Dr. Georgina Snell for ultrasound-guided glenohumeral injection.  Exercises provided as well.  Follow-up as needed.  Follow-Up Instructions: No follow-ups on file.   Orders:  Orders Placed This Encounter  Procedures   XR Shoulder Left   AMB referral to sports medicine   No orders of the defined types were placed in this encounter.     Procedures: No procedures performed   Clinical Data: No additional findings.   Subjective: Chief Complaint  Patient presents with   Left Shoulder - Pain    HPI Curtis Hunt is a very pleasant 71 year old gentleman well-known to me.  He comes in for evaluation of left shoulder pain for a year.  Denies any injuries.  Pain with lifting of his arm.  Feels some stiffness.  Left-hand-dominant.  No prior surgery or injuries.  Feels pain to the lateral shoulder.  Denies radicular symptoms. Review of Systems  Constitutional: Negative.   All other systems reviewed and are negative.    Objective: Vital Signs: There were no vitals taken for this visit.  Physical Exam Vitals and nursing note reviewed.  Constitutional:      Appearance: He is well-developed.  HENT:     Head: Normocephalic and atraumatic.  Eyes:     Pupils: Pupils are equal, round, and reactive to light.  Pulmonary:     Effort: Pulmonary effort is normal.  Abdominal:     Palpations: Abdomen is soft.  Musculoskeletal:        General: Normal range of motion.     Cervical back: Neck supple.  Skin:    General: Skin is warm.  Neurological:     Mental Status: He is alert and  oriented to person, place, and time.  Psychiatric:        Behavior: Behavior normal.        Thought Content: Thought content normal.        Judgment: Judgment normal.     Ortho Exam Examination of left shoulder shows flexion to 130 degrees abduction to 90 degrees internal rotation to beltline external rotation 35 degrees all with pain.  Manual muscle testing of the rotator cuff is normal.  Slight pain with impingement testing. Specialty Comments:  No specialty comments available.  Imaging: XR Shoulder Left  Result Date: 12/18/2021 Mild glenohumeral osteoarthritis    PMFS History: Patient Active Problem List   Diagnosis Date Noted   Primary osteoarthritis of left hip 09/30/2020   Status post total replacement of left hip 09/30/2020   Change in bowel habit 07/03/2020   Colon cancer screening 07/03/2020   Diverticular disease of colon 07/03/2020   Hematochezia 07/03/2020   Hiatal hernia 07/03/2020   Iron deficiency anemia 07/03/2020   Morbid obesity ([place]) 07/03/2020   Personal history of colonic polyps 07/03/2020   Status post total replacement of right hip 12/18/2019   Malignant neoplasm of prostate ([place]) 04/29/2015   Rhabdomyolysis 06/09/2013   Dehydration 06/09/2013  Newly diagnosed diabetes ([place]) 06/09/2013   Dyslipidemia 87/68/1157   Metabolic encephalopathy 26/20/3559   Hypernatremia 06/09/2013   DKA (diabetic ketoacidoses) 06/08/2013   Weight loss 06/08/2013   Oral thrush 06/08/2013   GERD (gastroesophageal reflux disease) 06/08/2013   Jerking movements of extremities 06/08/2013   Acute renal failure ([place]) 06/08/2013   Gout    Past Medical History:  Diagnosis Date   Arthritis    Cancer ([place]) 2004   Prostate CA with surgery   Diabetes mellitus without complication ([place])    Gout    Hyperlipidemia    Prostate cancer ([place])     Family History  Problem Relation Age of Onset   Cancer Brother        prostate   Cancer Mother        breast ca    Past  Surgical History:  Procedure Laterality Date   JOINT REPLACEMENT Right 12/18/2019   total hip replacement w/ Dr. Erlinda Hong   PROSTATE SURGERY     TOTAL HIP ARTHROPLASTY Right 12/18/2019   Procedure: RIGHT TOTAL HIP ARTHROPLASTY ANTERIOR APPROACH;  Surgeon: Leandrew Koyanagi, MD;  Location: [facility];  Service: Orthopedics;  Laterality: Right;   TOTAL HIP ARTHROPLASTY Left 09/30/2020   Procedure: LEFT TOTAL HIP ARTHROPLASTY ANTERIOR APPROACH;  Surgeon: Leandrew Koyanagi, MD;  Location: [facility];  Service: Orthopedics;  Laterality: Left;   Social History   Occupational History   Not on file  Tobacco Use   Smoking status: Never   Smokeless tobacco: Never  Vaping Use   Vaping Use: Never used  Substance and Sexual Activity   Alcohol use: Yes    Comment: Beer prior to diagnosis of diabetes, about once a week during sports on tv   Drug use: No   Sexual activity: Not Currently

## 2021-12-18 NOTE — Progress Notes (Signed)
   I, Curtis Hunt, LAT, ATC acting as a scribe for Curtis Leader, Curtis Hunt.  Subjective:    CC: L shoulder pain  HPI: Pt is a 71 y/o male c/o chronic L shoulder pain. Pt is L-hand dominate. Pt was seen by Dr. Erlinda Hong on 12/18/21 and dx w/ L GH OA. Pt locates pain to the anterior and lateral aspect of the L shoulder. No radiating pain. No numbness/tingling noted.  Dx imaging: 12/18/21 L shoulder XR  Pertinent review of Systems: No fevers or chills  Relevant historical information: Diabetes   Objective:    Vitals:   12/22/21 0924  BP: (!) 150/86  Pulse: 74  SpO2: 94%   General: Well Developed, well nourished, and in no acute distress.   MSK: Left shoulder: Decreased range of motion.  Pain with abduction.  Lab and Radiology Results  Procedure: Real-time Ultrasound Guided Injection of left shoulder glenohumeral joint posterior approach Device: Philips Affiniti 50G Images permanently stored and available for review in PACS Verbal informed consent obtained.  Discussed risks and benefits of procedure. Warned about infection, bleeding, hyperglycemia damage to structures among others. Patient expresses understanding and agreement Time-out conducted.   Noted no overlying erythema, induration, or other signs of local infection.   Skin prepped in a sterile fashion.   Local anesthesia: Topical Ethyl chloride.   With sterile technique and under real time ultrasound guidance: 40 mg of Kenalog and 2 mL of Marcaine injected into glenohumeral joint. Fluid seen entering the joint capsule.   Completed without difficulty   Pain immediately resolved suggesting accurate placement of the medication.   Advised to call if fevers/chills, erythema, induration, drainage, or persistent bleeding.   Images permanently stored and available for review in the ultrasound unit.  Impression: Technically successful ultrasound guided injection.   X-ray images left shoulder obtained on December 18, 2021 personally and  independently interpreted. Mild to moderate glenohumeral DJD with elevation of humeral head.    Impression and Recommendations:    Assessment and Plan: 71 y.o. male with left shoulder pain thought to be due to glenohumeral DJD.  Plan for glenohumeral injection.  He had significant improvement in pain indicating that his pain is likely due to glenohumeral cause.  Hopefully will have some prolonged benefit.  Check back with Dr Erlinda Hong or myself. [place] Kitchen  PDMP not reviewed this encounter. Orders Placed This Encounter  Procedures   [place] LIMITED JOINT SPACE STRUCTURES UP LEFT(NO LINKED CHARGES)    Order Specific Question:   Reason for Exam (SYMPTOM  OR DIAGNOSIS REQUIRED)    Answer:   left shoulder pain    Order Specific Question:   Preferred imaging location?    Answer:   [place]   No orders of the defined types were placed in this encounter.   Discussed warning signs or symptoms. Please see discharge instructions. Patient expresses understanding.   The above documentation has been reviewed and is accurate and complete Curtis Hunt, M.D.

## 2021-12-18 NOTE — Telephone Encounter (Signed)
Ortho bundle 1 year call for Left THA.

## 2021-12-22 ENCOUNTER — Other Ambulatory Visit: Payer: Self-pay

## 2021-12-22 ENCOUNTER — Ambulatory Visit: Payer: Self-pay

## 2021-12-22 ENCOUNTER — Ambulatory Visit (INDEPENDENT_AMBULATORY_CARE_PROVIDER_SITE_OTHER): Payer: Medicare Other | Admitting: Family Medicine

## 2021-12-22 VITALS — BP 150/86 | HR 74 | Ht 73.0 in | Wt 281.6 lb

## 2021-12-22 DIAGNOSIS — M25512 Pain in left shoulder: Secondary | ICD-10-CM | POA: Diagnosis not present

## 2021-12-22 DIAGNOSIS — G8929 Other chronic pain: Secondary | ICD-10-CM | POA: Diagnosis not present

## 2021-12-22 NOTE — Telephone Encounter (Signed)
Pharmacy faxed a refill request for TRULICITY 1.5 MQ/5.9CN SOPN [[id number]]    Order Details Dose: 1.5 mg Route: Injection Frequency: Every Tue  Dispense Quantity: 1.5 mL Refills: 1        Sig: INJECT 1.5 MG AS DIRECTED EVERY TUESDAY.       Start Date: 10/07/21 End Date: --  Written Date: 10/06/21 Expiration Date: 10/06/22

## 2021-12-22 NOTE — Patient Instructions (Signed)
Thank you for coming in today.   You received an injection today. Seek immediate medical attention if the joint becomes red, extremely painful, or is oozing fluid.   Check back with me as needed

## 2021-12-23 ENCOUNTER — Encounter: Payer: Self-pay | Admitting: Family Medicine

## 2021-12-23 MED ORDER — TRULICITY 1.5 MG/0.5ML ~~LOC~~ SOAJ: 1.5000 mg | SUBCUTANEOUS | 1 refills | Status: DC

## 2021-12-23 NOTE — Telephone Encounter (Signed)
Appointment 10/23/21 Requested Prescriptions  Pending Prescriptions Disp Refills  . Dulaglutide (TRULICITY) 1.5 OH/6.0VP SOPN 1.5 mL 1    Sig: Inject 1.5 mg as directed every Tuesday.     Endocrinology:  Diabetes - GLP-1 Receptor Agonists Failed - 12/22/2021  2:08 PM      Failed - Valid encounter within last 6 months    Recent Outpatient Visits          2 months ago Encounter for Medicare annual wellness exam   [place] Susy Frizzle, MD   1 year ago Controlled type 2 diabetes mellitus without complication, with long-term current use of insulin ([place])   [place] Susy Frizzle, MD   2 years ago Right hip pain   [place] Susy Frizzle, MD   2 years ago Routine general medical examination at a health care facility   [place], [name], MD   3 years ago Controlled type 2 diabetes mellitus without complication, with long-term current use of insulin ([place])   [facility] Pickard, [name], MD             Passed - HBA1C is between 0 and 7.9 and within 180 days    Hgb A1c MFr Bld  Date Value Ref Range Status  10/23/2021 6.5 (H) <5.7 % of total Hgb Final    Comment:    For someone without known diabetes, a hemoglobin A1c value of 6.5% or greater indicates that they may have  diabetes and this should be confirmed with a follow-up  test. . For someone with known diabetes, a value <7% indicates  that their diabetes is well controlled and a value  greater than or equal to 7% indicates suboptimal  control. A1c targets should be individualized based on  duration of diabetes, age, comorbid conditions, and  other considerations. . Currently, no consensus exists regarding use of hemoglobin A1c for diagnosis of diabetes for children. [place] Kitchen

## 2021-12-24 ENCOUNTER — Other Ambulatory Visit: Payer: Self-pay | Admitting: Family Medicine

## 2021-12-25 NOTE — Telephone Encounter (Signed)
Had physical 10/23/2021 though protocol indicating he has not had one.   Refill given.  Requested Prescriptions  Pending Prescriptions Disp Refills  . B-D UF III MINI PEN NEEDLES 31G X 5 MM MISC [Pharmacy Med Name: BD UF MINI PEN NEEDLE 5MMX31G] 100 each 3    Sig: USE AS DIRECTED TWICE A DAY     Endocrinology: Diabetes - Testing Supplies Failed - 12/24/2021  4:03 PM      Failed - Valid encounter within last 12 months    Recent Outpatient Visits          2 months ago Encounter for Medicare annual wellness exam   [place] Susy Frizzle, MD   1 year ago Controlled type 2 diabetes mellitus without complication, with long-term current use of insulin ([place])   [place] Pickard, [name], MD   2 years ago Right hip pain   [place] Dennard Schaumann, [name], MD   2 years ago Routine general medical examination at a health care facility   [place], [name], MD   3 years ago Controlled type 2 diabetes mellitus without complication, with long-term current use of insulin ([place])   [facility] Pickard, [name], MD

## 2021-12-31 ENCOUNTER — Other Ambulatory Visit: Payer: Self-pay | Admitting: Family Medicine

## 2021-12-31 NOTE — Telephone Encounter (Signed)
Requested Prescriptions  Pending Prescriptions Disp Refills  . rosuvastatin (CRESTOR) 40 MG tablet [Pharmacy Med Name: ROSUVASTATIN CALCIUM 40 MG TAB] 90 tablet 1    Sig: TAKE 1 TABLET BY MOUTH EVERY DAY     Cardiovascular:  Antilipid - Statins 2 Failed - 12/31/2021  2:15 AM      Failed - Valid encounter within last 12 months    Recent Outpatient Visits          2 months ago Encounter for Medicare annual wellness exam   [place] Susy Frizzle, MD   1 year ago Controlled type 2 diabetes mellitus without complication, with long-term current use of insulin ([place])   [place] Pickard, [name], MD   2 years ago Right hip pain   [place] Dennard Schaumann, [name], MD   2 years ago Routine general medical examination at a health care facility   [place], [name], MD   3 years ago Controlled type 2 diabetes mellitus without complication, with long-term current use of insulin ([place])   [place] Pickard, [name], MD             Failed - Lipid Panel in normal range within the last 12 months    Cholesterol  Date Value Ref Range Status  10/23/2021 150 <200 mg/dL Final   LDL Cholesterol (Calc)  Date Value Ref Range Status  10/23/2021 79 mg/dL (calc) Final    Comment:    Reference range: <100 . Desirable range <100 mg/dL for primary prevention;   <70 mg/dL for patients with CHD or diabetic patients  with > or = 2 CHD risk factors. [place] Kitchen LDL-C is now calculated using the Martin-Hopkins  calculation, which is a validated novel method providing  better accuracy than the Friedewald equation in the  estimation of LDL-C.  Cresenciano Genre et al. Annamaria Helling. 0623;762(83): 2061-2068  (http://education.[employer].com/faq/FAQ164)    HDL  Date Value Ref Range Status  10/23/2021 49 > OR = 40 mg/dL Final   Triglycerides  Date Value Ref Range Status  10/23/2021 128 <150 mg/dL Final         Passed -  Cr in normal range and within 360 days    Creat  Date Value Ref Range Status  10/23/2021 1.12 0.70 - 1.28 mg/dL Final   Creatinine, Urine  Date Value Ref Range Status  02/22/2020 152 20 - 320 mg/dL Final         Passed - Patient is not pregnant

## 2022-01-15 LAB — HM DIABETES EYE EXAM

## 2022-01-23 ENCOUNTER — Encounter: Payer: Self-pay | Admitting: Family Medicine

## 2022-02-02 ENCOUNTER — Other Ambulatory Visit: Payer: Self-pay | Admitting: Family Medicine

## 2022-02-03 NOTE — Telephone Encounter (Signed)
Requested Prescriptions  Pending Prescriptions Disp Refills  . TRULICITY 1.5 KW/4.0XB SOPN [Pharmacy Med Name: TRULICITY 1.5 DZ/3.2 ML PEN] 1.5 mL 3    Sig: INJECT 1.5 MG AS DIRECTED EVERY TUESDAY.     Endocrinology:  Diabetes - GLP-1 Receptor Agonists Failed - 02/02/2022  9:46 AM      Failed - Valid encounter within last 6 months    Recent Outpatient Visits          3 months ago Encounter for Medicare annual wellness exam   [place] Susy Frizzle, MD   1 year ago Controlled type 2 diabetes mellitus without complication, with long-term current use of insulin ([place])   [name], [name], MD   2 years ago Right hip pain   [name], MD   3 years ago Routine general medical examination at a health care facility   [place], [name], MD   3 years ago Controlled type 2 diabetes mellitus without complication, with long-term current use of insulin ([place])   [facility] Pickard, [name], MD             Passed - HBA1C is between 0 and 7.9 and within 180 days    Hgb A1c MFr Bld  Date Value Ref Range Status  10/23/2021 6.5 (H) <5.7 % of total Hgb Final    Comment:    For someone without known diabetes, a hemoglobin A1c value of 6.5% or greater indicates that they may have  diabetes and this should be confirmed with a follow-up  test. . For someone with known diabetes, a value <7% indicates  that their diabetes is well controlled and a value  greater than or equal to 7% indicates suboptimal  control. A1c targets should be individualized based on  duration of diabetes, age, comorbid conditions, and  other considerations. . Currently, no consensus exists regarding use of hemoglobin A1c for diagnosis of diabetes for children. [place] Kitchen

## 2022-02-24 ENCOUNTER — Encounter: Payer: Self-pay | Admitting: Family Medicine

## 2022-02-26 ENCOUNTER — Other Ambulatory Visit: Payer: Self-pay | Admitting: Family Medicine

## 2022-02-26 MED ORDER — DEXCOM G7 RECEIVER DEVI
1.0000 | 0 refills | Status: DC
Start: 2022-02-26 — End: 2022-06-08

## 2022-02-26 MED ORDER — DEXCOM G7 SENSOR MISC
1.0000 | 3 refills | Status: DC
Start: 2022-02-26 — End: 2022-05-27

## 2022-04-03 IMAGING — RF DG HIP (WITH PELVIS) OPERATIVE*L*
1 series · 4 of 4 positions shown · non-contrast
Comparison: Radiograph of the pelvis 06/19/2020.

CLINICAL DATA: Surgery, elective. Additional history provided: Left
total hip arthroplasty, anterior approach. Provided fluoroscopy time
27 seconds (5.94 mGy).

EXAM:
OPERATIVE left HIP (WITH PELVIS IF PERFORMED) 4 VIEWS
TECHNIQUE: Fluoroscopic spot image(s) were submitted for interpretation
post-operatively.

[Series 1: unknown protocol · 0.20mm/px · 4 of 4 slices shown]
[im 1/4]
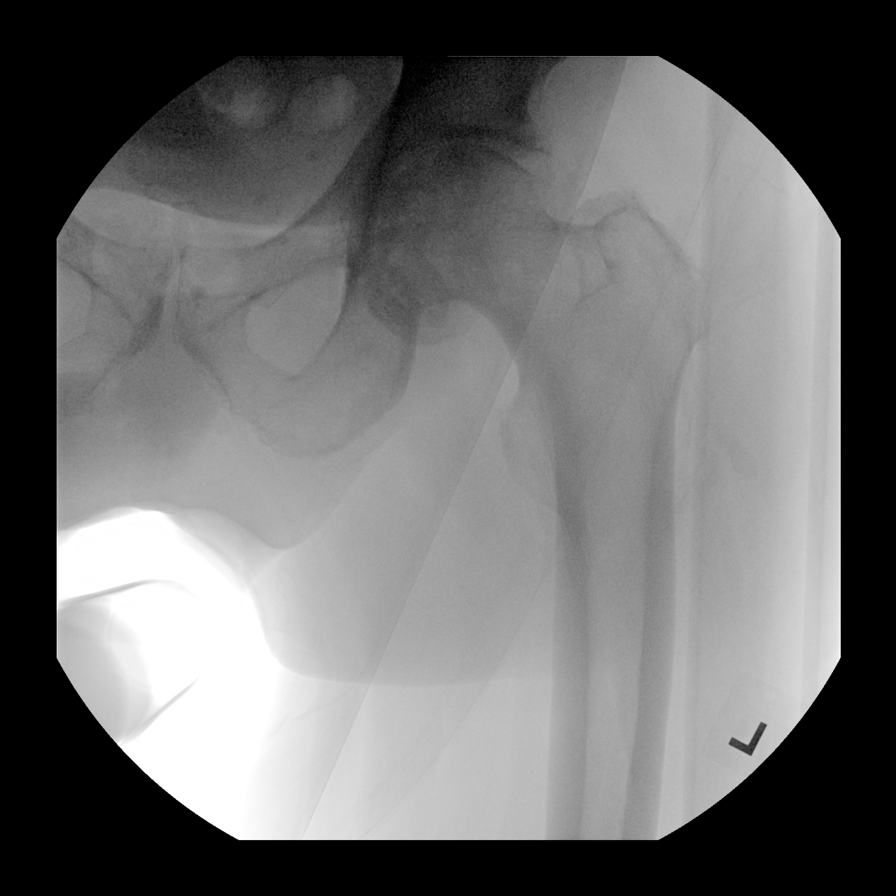
[im 2/4]
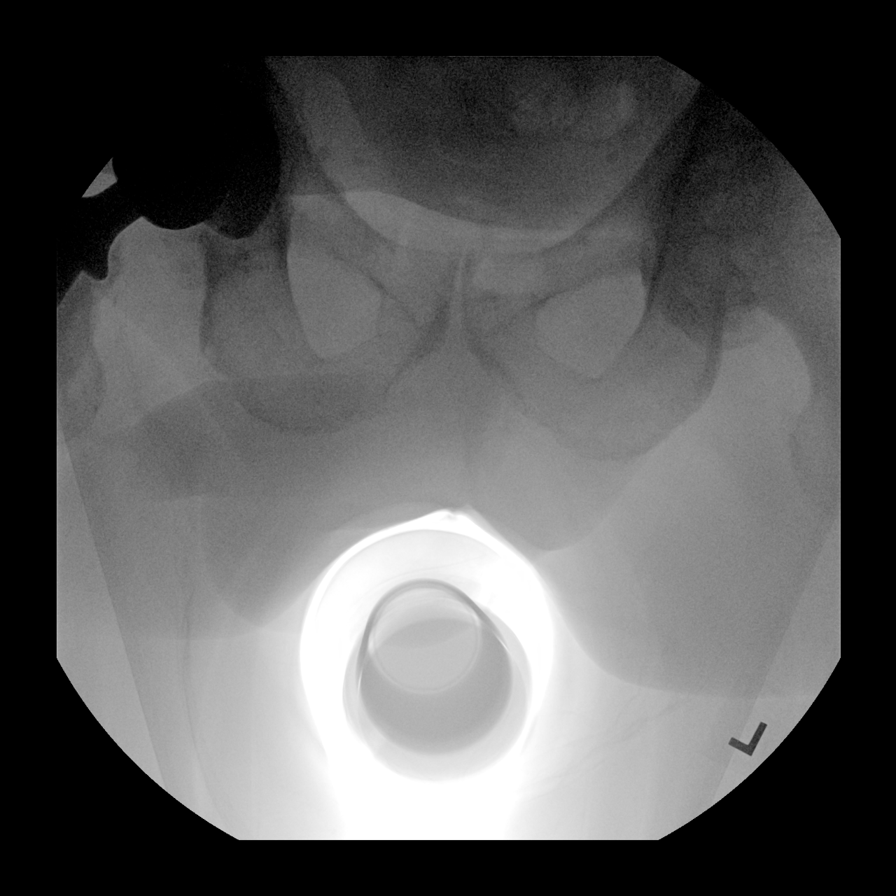
[im 3/4]
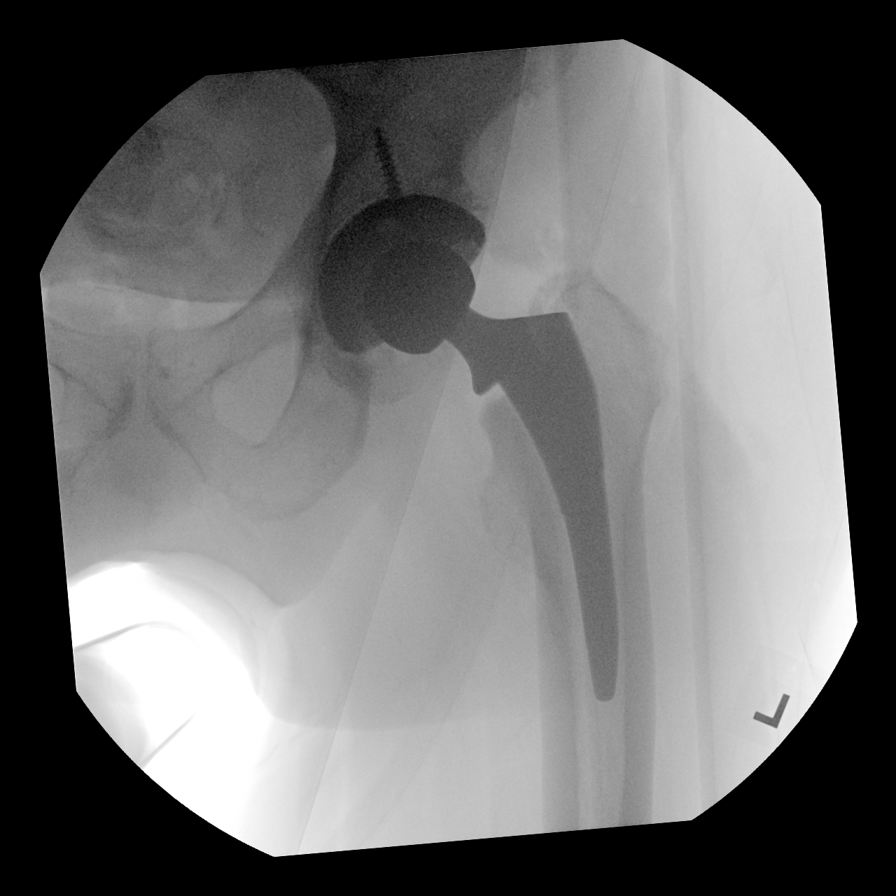
[im 4/4]
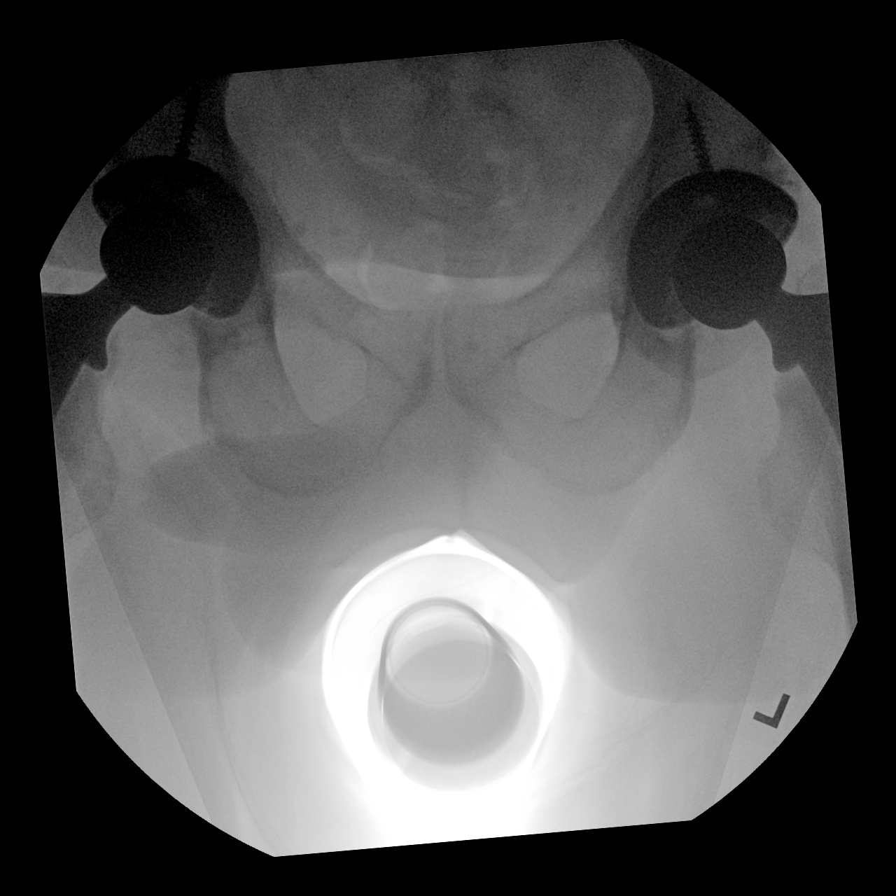

[4 of 4 positions shown; findings below may reference images not displayed]

FINDINGS: Four intraoperative fluoroscopic images of the left hip are
submitted. On the provided images, there are findings of interval
left total hip arthroplasty. The femoral and acetabular components
appear well seated. No unexpected finding. Partial visualization of
a pre-existing right total hip arthroplasty.
IMPRESSION: Four intraoperative fluoroscopic images of the left hip from left
total hip arthroplasty, as described.

## 2022-04-22 ENCOUNTER — Other Ambulatory Visit: Payer: Medicare Other

## 2022-04-22 DIAGNOSIS — E870 Hyperosmolality and hypernatremia: Secondary | ICD-10-CM

## 2022-04-22 DIAGNOSIS — N179 Acute kidney failure, unspecified: Secondary | ICD-10-CM

## 2022-04-22 DIAGNOSIS — E785 Hyperlipidemia, unspecified: Secondary | ICD-10-CM | POA: Diagnosis not present

## 2022-04-22 DIAGNOSIS — D509 Iron deficiency anemia, unspecified: Secondary | ICD-10-CM

## 2022-04-22 DIAGNOSIS — E119 Type 2 diabetes mellitus without complications: Secondary | ICD-10-CM | POA: Diagnosis not present

## 2022-04-23 LAB — COMPLETE METABOLIC PANEL WITH GFR
AG Ratio: 1.3 (calc) (ref 1.0–2.5)
ALT: 12 U/L (ref 9–46)
AST: 13 U/L (ref 10–35)
Albumin: 4.1 g/dL (ref 3.6–5.1)
Alkaline phosphatase (APISO): 64 U/L (ref 35–144)
BUN: 20 mg/dL (ref 7–25)
CO2: 26 mmol/L (ref 20–32)
Calcium: 9.6 mg/dL (ref 8.6–10.3)
Chloride: 104 mmol/L (ref 98–110)
Creat: 1.19 mg/dL (ref 0.70–1.28)
Globulin: 3.1 g/dL (calc) (ref 1.9–3.7)
Glucose, Bld: 116 mg/dL — ABNORMAL HIGH (ref 65–99)
Potassium: 4.3 mmol/L (ref 3.5–5.3)
Sodium: 140 mmol/L (ref 135–146)
Total Bilirubin: 0.2 mg/dL (ref 0.2–1.2)
Total Protein: 7.2 g/dL (ref 6.1–8.1)
eGFR: 65 mL/min/{1.73_m2} (ref >=60)

## 2022-04-23 LAB — MICROALBUMIN / CREATININE URINE RATIO
Creatinine, Urine: 131 mg/dL (ref 20–320)
Microalb Creat Ratio: 60 mcg/mg creat — ABNORMAL HIGH (ref <30)
Microalb, Ur: 7.8 mg/dL

## 2022-04-23 LAB — CBC WITH DIFFERENTIAL/PLATELET
Absolute Monocytes: 490 cells/uL (ref 200–950)
Basophils Absolute: 103 cells/uL (ref 0–200)
Basophils Relative: 1.8 %
Eosinophils Absolute: 222 cells/uL (ref 15–500)
Eosinophils Relative: 3.9 %
HCT: 36 % — ABNORMAL LOW (ref 38.5–50.0)
Hemoglobin: 12.2 g/dL — ABNORMAL LOW (ref 13.2–17.1)
Lymphs Abs: 2252 cells/uL (ref 850–3900)
MCH: 29.5 pg (ref 27.0–33.0)
MCHC: 33.9 g/dL (ref 32.0–36.0)
MCV: 87.2 fL (ref 80.0–100.0)
MPV: 10.4 fL (ref 7.5–12.5)
Monocytes Relative: 8.6 %
Neutro Abs: 2633 cells/uL (ref 1500–7800)
Neutrophils Relative %: 46.2 %
Platelets: 255 10*3/uL (ref 140–400)
RBC: 4.13 10*6/uL — ABNORMAL LOW (ref 4.20–5.80)
RDW: 14 % (ref 11.0–15.0)
Total Lymphocyte: 39.5 %
WBC: 5.7 10*3/uL (ref 3.8–10.8)

## 2022-04-23 LAB — LIPID PANEL
Cholesterol: 147 mg/dL (ref <200)
HDL: 48 mg/dL (ref >=40)
LDL Cholesterol (Calc): 77 mg/dL (calc)
Non-HDL Cholesterol (Calc): 99 mg/dL (calc) (ref <130)
Total CHOL/HDL Ratio: 3.1 (calc) (ref <5.0)
Triglycerides: 134 mg/dL (ref <150)

## 2022-04-23 LAB — HEMOGLOBIN A1C
Hgb A1c MFr Bld: 7.5 % of total Hgb — ABNORMAL HIGH (ref <5.7)
Mean Plasma Glucose: 169 mg/dL
eAG (mmol/L): 9.3 mmol/L

## 2022-04-23 LAB — VITAMIN B12: Vitamin B-12: 471 pg/mL (ref 200–1100)

## 2022-04-24 ENCOUNTER — Telehealth: Payer: Self-pay

## 2022-04-24 NOTE — Telephone Encounter (Signed)
Please called pt to make OV per Dr. Dennard Schaumann due to Sugars are not well controlled, would like ot see him.

## 2022-04-27 NOTE — Telephone Encounter (Signed)
Patient is scheduled for Thursday, 04/30/2022

## 2022-04-30 ENCOUNTER — Ambulatory Visit (INDEPENDENT_AMBULATORY_CARE_PROVIDER_SITE_OTHER): Payer: Medicare Other | Admitting: Family Medicine

## 2022-04-30 ENCOUNTER — Encounter: Payer: Self-pay | Admitting: Family Medicine

## 2022-04-30 VITALS — BP 132/82 | HR 72 | Ht 73.0 in | Wt 282.6 lb

## 2022-04-30 DIAGNOSIS — D649 Anemia, unspecified: Secondary | ICD-10-CM | POA: Diagnosis not present

## 2022-04-30 DIAGNOSIS — Z794 Long term (current) use of insulin: Secondary | ICD-10-CM | POA: Diagnosis not present

## 2022-04-30 DIAGNOSIS — E119 Type 2 diabetes mellitus without complications: Secondary | ICD-10-CM

## 2022-04-30 MED ORDER — SEMAGLUTIDE (1 MG/DOSE) 4 MG/3ML ~~LOC~~ SOPN: 1.0000 mg | PEN_INJECTOR | SUBCUTANEOUS | 3 refills | Status: DC

## 2022-04-30 NOTE — Progress Notes (Signed)
Subjective:    Patient ID: Curtis Hunt, male    DOB: November 12, 1950, 71 y.o.   MRN: [id number]  HPI Patient states that his sugars are high for the last 3 months despite taking his Levemir 22 units daily and his Trulicity 1.5 mg subcu weekly.  He states his fasting blood sugars are averaging between 100-140.  He is 75% of the time in range and 25% of the time high with 0% of the time low on his continuous blood glucose monitor.  Most recent lab work is listed below Lab on 04/22/2022  Component Date Value Ref Range Status   WBC 04/22/2022 5.7  3.8 - 10.8 Thousand/uL Final   RBC 04/22/2022 4.13 (L)  4.20 - 5.80 Million/uL Final   Hemoglobin 04/22/2022 12.2 (L)  13.2 - 17.1 g/dL Final   HCT 04/22/2022 36.0 (L)  38.5 - 50.0 % Final   MCV 04/22/2022 87.2  80.0 - 100.0 fL Final   MCH 04/22/2022 29.5  27.0 - 33.0 pg Final   MCHC 04/22/2022 33.9  32.0 - 36.0 g/dL Final   RDW 04/22/2022 14.0  11.0 - 15.0 % Final   Platelets 04/22/2022 255  140 - 400 Thousand/uL Final   MPV 04/22/2022 10.4  7.5 - 12.5 fL Final   Neutro Abs 04/22/2022 2,633  1,500 - 7,800 cells/uL Final   Lymphs Abs 04/22/2022 2,252  850 - 3,900 cells/uL Final   Absolute Monocytes 04/22/2022 490  200 - 950 cells/uL Final   Eosinophils Absolute 04/22/2022 222  15 - 500 cells/uL Final   Basophils Absolute 04/22/2022 103  0 - 200 cells/uL Final   Neutrophils Relative % 04/22/2022 46.2  % Final   Total Lymphocyte 04/22/2022 39.5  % Final   Monocytes Relative 04/22/2022 8.6  % Final   Eosinophils Relative 04/22/2022 3.9  % Final   Basophils Relative 04/22/2022 1.8  % Final   Glucose, Bld 04/22/2022 116 (H)  65 - 99 mg/dL Final   Comment: .            Fasting reference interval . For someone without known diabetes, a glucose value between 100 and 125 mg/dL is consistent with prediabetes and should be confirmed with a follow-up test. .    BUN 04/22/2022 20  7 - 25 mg/dL Final   Creat 04/22/2022 1.19  0.70 - 1.28 mg/dL Final    eGFR 04/22/2022 65  > OR = 60 mL/min/1.10m Final   BUN/Creatinine Ratio 04/22/2022 SEE NOTE:  6 - 22 (calc) Final   Comment:    Not Reported: BUN and Creatinine are within    reference range. .    Sodium 04/22/2022 140  135 - 146 mmol/L Final   Potassium 04/22/2022 4.3  3.5 - 5.3 mmol/L Final   Chloride 04/22/2022 104  98 - 110 mmol/L Final   CO2 04/22/2022 26  20 - 32 mmol/L Final   Calcium 04/22/2022 9.6  8.6 - 10.3 mg/dL Final   Total Protein 04/22/2022 7.2  6.1 - 8.1 g/dL Final   Albumin 04/22/2022 4.1  3.6 - 5.1 g/dL Final   Globulin 04/22/2022 3.1  1.9 - 3.7 g/dL (calc) Final   AG Ratio 04/22/2022 1.3  1.0 - 2.5 (calc) Final   Total Bilirubin 04/22/2022 0.2  0.2 - 1.2 mg/dL Final   Alkaline phosphatase (APISO) 04/22/2022 64  35 - 144 U/L Final   AST 04/22/2022 13  10 - 35 U/L Final   ALT 04/22/2022 12  9 - 46 U/L  Final   Hgb A1c MFr Bld 04/22/2022 7.5 (H)  <5.7 % of total Hgb Final   Comment: For someone without known diabetes, a hemoglobin A1c value of 6.5% or greater indicates that they may have  diabetes and this should be confirmed with a follow-up  test. . For someone with known diabetes, a value <7% indicates  that their diabetes is well controlled and a value  greater than or equal to 7% indicates suboptimal  control. A1c targets should be individualized based on  duration of diabetes, age, comorbid conditions, and  other considerations. . Currently, no consensus exists regarding use of hemoglobin A1c for diagnosis of diabetes for children. .    Mean Plasma Glucose 04/22/2022 169  mg/dL Final   eAG (mmol/L) 04/22/2022 9.3  mmol/L Final   Cholesterol 04/22/2022 147  <200 mg/dL Final   HDL 04/22/2022 48  > OR = 40 mg/dL Final   Triglycerides 04/22/2022 134  <150 mg/dL Final   LDL Cholesterol (Calc) 04/22/2022 77  mg/dL (calc) Final   Comment: Reference range: <100 . Desirable range <100 mg/dL for primary prevention;   <70 mg/dL for patients with CHD or  diabetic patients  with > or = 2 CHD risk factors. Marland Kitchen LDL-C is now calculated using the Martin-Hopkins  calculation, which is a validated novel method providing  better accuracy than the Friedewald equation in the  estimation of LDL-C.  Cresenciano Genre et al. Annamaria Helling. 0034;917(91): 2061-2068  (http://education.[facility].com/faq/FAQ164)    Total CHOL/HDL Ratio 04/22/2022 3.1  <5.0 (calc) Final   Non-HDL Cholesterol (Calc) 04/22/2022 99  <130 mg/dL (calc) Final   Comment: For patients with diabetes plus 1 major ASCVD risk  factor, treating to a non-HDL-C goal of <100 mg/dL  (LDL-C of <70 mg/dL) is considered a therapeutic  option.    Creatinine, Urine 04/22/2022 131  20 - 320 mg/dL Final   Microalb, Ur 04/22/2022 7.8  mg/dL Final   Comment: Reference Range Not established    Microalb Creat Ratio 04/22/2022 60 (H)  <30 mcg/mg creat Final   Comment: . The ADA defines abnormalities in albumin excretion as follows: Marland Kitchen Albuminuria Category        Result (mcg/mg creatinine) . Normal to Mildly increased   <30 Moderately increased         30-299  Severely increased           > OR = 300 . The ADA recommends that at least two of three specimens collected within a 3-6 month period be abnormal before considering a patient to be within a diagnostic category.    Vitamin B-12 04/22/2022 471  200 - 1,100 pg/mL Final    Immunization History  Administered Date(s) Administered   Fluad Quad(high Dose 65+) 02/14/2019, 02/22/2020, 02/12/2021   Influenza Split 02/22/2013, 02/22/2014   Influenza-Unspecified 02/19/2015, 02/13/2016, 02/22/2017   PFIZER(Purple Top)SARS-COV-2 Vaccination 06/19/2019, 07/10/2019, 04/05/2020   PPD Test 09/13/2012, 08/02/2013, 08/20/2014, 09/03/2015, 09/21/2016   Pfizer Covid-19 Vaccine Bivalent Booster 3yr & up 02/12/2021   Pneumococcal Conjugate-13 09/24/2017   Pneumococcal Polysaccharide-23 05/11/2014, 02/22/2020   Tdap 09/10/2010   Zoster Recombinat (Shingrix)  12/18/2021, 02/25/2022   Zoster, Live 09/13/2012     Past Medical History:  Diagnosis Date   Arthritis    [place]   Prostate CA with surgery   Diabetes mellitus without complication ([place]    Gout    Hyperlipidemia    Prostate [facility]    Past Surgical History:  Procedure Laterality Date  JOINT REPLACEMENT Right 12/18/2019   total hip replacement w/ Dr. Erlinda Hong   PROSTATE SURGERY     TOTAL HIP ARTHROPLASTY Right 12/18/2019   Procedure: RIGHT TOTAL HIP ARTHROPLASTY ANTERIOR APPROACH;  Surgeon: Leandrew Koyanagi, MD;  Location: [place];  Service: Orthopedics;  Laterality: Right;   TOTAL HIP ARTHROPLASTY Left 09/30/2020   Procedure: LEFT TOTAL HIP ARTHROPLASTY ANTERIOR APPROACH;  Surgeon: Leandrew Koyanagi, MD;  Location: [place];  Service: Orthopedics;  Laterality: Left;   Current Outpatient Medications on File Prior to Visit  Medication Sig Dispense Refill   aspirin EC 81 MG tablet Take 1 tablet (81 mg total) by mouth 2 (two) times daily. To be taken after surgery 84 tablet 0   B-D UF III MINI PEN NEEDLES 31G X 5 MM MISC USE AS DIRECTED TWICE A DAY 100 each 3   Blood Glucose Monitoring Suppl (ONE TOUCH ULTRA 2) w/Device KIT ONE TOUCH ULTRA 2 GLUCOSE SYSTEM W/ DEVICE KIT 1 kit 0   Continuous Blood Gluc Receiver (DEXCOM G7 RECEIVER) DEVI 1 Device by Does not apply route as directed. 1 each 0   Continuous Blood Gluc Sensor (DEXCOM G7 SENSOR) MISC 1 Device by Does not apply route as directed. 3 each 3   glucose blood (ONETOUCH ULTRA) test strip CHECK SUGAR 4 TO 5 TIMES DAILY EVERY MORNING, AFTER MEALS, AND AT BEDTIME E11.65 100 strip 11   insulin detemir (LEVEMIR FLEXPEN) 100 UNIT/ML FlexPen Inject 22U daily at 10pm 15 mL 11   losartan (COZAAR) 50 MG tablet Take 1 tablet (50 mg total) by mouth daily. 90 tablet 3   rosuvastatin (CRESTOR) 40 MG tablet TAKE 1 TABLET BY MOUTH EVERY DAY 90 tablet 3   sildenafil (VIAGRA) 100 MG tablet TAKE 0.5-1 TABLETS BY MOUTH DAILY AS NEEDED FOR ERECTILE  DYSFUNCTION. 4 tablet 14   traZODone (DESYREL) 50 MG tablet Take 1 tablet (50 mg total) by mouth at bedtime as needed for sleep. 30 tablet 2   TRULICITY 1.5 DV/7.6HY SOPN INJECT 1.5 MG AS DIRECTED EVERY TUESDAY. 1.5 mL 3   No current facility-administered medications on file prior to visit.   No Known Allergies Social History   Socioeconomic History   Marital status: Married    Spouse name: Not on file   Number of children: 3   Years of education: Not on file   Highest education level: Not on file  Occupational History   Not on file  Tobacco Use   Smoking status: Never   Smokeless tobacco: Never  Vaping Use   Vaping Use: Never used  Substance and Sexual Activity   Alcohol use: Yes    Comment: Beer prior to diagnosis of diabetes, about once a week during sports on tv   Drug use: No   Sexual activity: Not Currently  Other Topics Concern   Not on file  Social History Narrative   5 grandchildren and 2 great grandchildren.   1st wife died in 08-02-2015, married x 45 years.   Remarried 2nd wife 12/2020.    Social Determinants of Health   Financial Resource Strain: Low Risk  (06/19/2021)   Overall Financial Resource Strain (CARDIA)    Difficulty of Paying Living Expenses: Not hard at all  Food Insecurity: No Food Insecurity (06/19/2021)   Hunger Vital Sign    Worried About Running Out of Food in the Last Year: Never true    Ran Out of Food in the Last Year: Never true  Transportation Needs: No Transportation Needs (  06/19/2021)   PRAPARE - Hydrologist (Medical): No    Lack of Transportation (Non-Medical): No  Physical Activity: Insufficiently Active (06/19/2021)   Exercise Vital Sign    Days of Exercise per Week: 2 days    Minutes of Exercise per Session: 20 min  Stress: No Stress Concern Present (06/19/2021)   [place]    Feeling of Stress : Not at all  Social Connections: [place] (06/19/2021)   Social Connection and Isolation Panel [NHANES]    Frequency of Communication with Friends and Family: More than three times a week    Frequency of Social Gatherings with Friends and Family: More than three times a week    Attends Religious Services: More than 4 times per year    Active Member of [employer] or Organizations: Yes    Attends Music therapist: More than 4 times per year    Marital Status: Married  Human resources officer Violence: Not At Risk (06/19/2021)   Humiliation, Afraid, Rape, and Kick questionnaire    Fear of Current or Ex-Partner: No    Emotionally Abused: No    Physically Abused: No    Sexually Abused: No   Family History  Problem Relation Age of Onset   Cancer Brother        prostate   Cancer Mother        breast ca     Review of Systems  All other systems reviewed and are negative.      Objective:   Physical Exam Vitals reviewed.  Constitutional:      General: He is not in acute distress.    Appearance: He is well-developed. He is not diaphoretic.  HENT:     Head: Normocephalic and atraumatic.     Right Ear: External ear normal.     Left Ear: External ear normal.     Nose: Nose normal.     Mouth/Throat:     Pharynx: No oropharyngeal exudate.  Eyes:     General: No scleral icterus.       Right eye: No discharge.        Left eye: No discharge.     Conjunctiva/sclera: Conjunctivae normal.     Pupils: Pupils are equal, round, and reactive to light.  Neck:     Thyroid: No thyromegaly.     Vascular: No JVD.     Trachea: No tracheal deviation.  Cardiovascular:     Rate and Rhythm: Normal rate and regular rhythm.     Heart sounds: Normal heart sounds. No murmur heard.    No friction rub. No gallop.  Pulmonary:     Effort: Pulmonary effort is normal. No respiratory distress.     Breath sounds: Normal breath sounds. No stridor. No wheezing or rales.  Chest:     Chest wall: No tenderness.  Abdominal:     General:  Bowel sounds are normal. There is no distension.     Palpations: Abdomen is soft. There is no mass.     Tenderness: There is no abdominal tenderness. There is no guarding or rebound.     Hernia: No hernia is present.  Musculoskeletal:        General: No tenderness or deformity. Normal range of motion.     Cervical back: Normal range of motion and neck supple.  Lymphadenopathy:     Cervical: No cervical adenopathy.  Skin:    General: Skin is warm.  Coloration: Skin is not pale.     Findings: No erythema or rash.  Neurological:     Mental Status: He is alert and oriented to person, place, and time.     Cranial Nerves: No cranial nerve deficit.     Sensory: No sensory deficit.     Motor: No abnormal muscle tone.     Coordination: Coordination normal.     Deep Tendon Reflexes: Reflexes normal.  Psychiatric:        Behavior: Behavior normal.        Thought Content: Thought content normal.        Judgment: Judgment normal.           Assessment & Plan:  Anemia, unspecified type - Plan: Fecal Globin By Immunochemistry  Controlled type 2 diabetes mellitus without complication, with long-term current use of insulin ([place]) Patient is slightly anemic so we will check a stool for blood.  Colonoscopy was performed 3 years ago was normal.  Blood pressure is excellent.  His cholesterol is outstanding.  We discussed options such as increasing insulin, switching to Ozempic and pushing the dose, or adding Jardiance.  He chooses to discontinue Trulicity switch to Ozempic 1 mg subcu weekly and push the dose every month as tolerated to the maximum dose try to facilitate weight loss

## 2022-05-27 ENCOUNTER — Other Ambulatory Visit: Payer: Self-pay | Admitting: Family Medicine

## 2022-06-08 ENCOUNTER — Other Ambulatory Visit: Payer: Self-pay | Admitting: Family Medicine

## 2022-06-08 ENCOUNTER — Other Ambulatory Visit: Payer: Self-pay

## 2022-06-08 ENCOUNTER — Telehealth: Payer: Self-pay | Admitting: Family Medicine

## 2022-06-08 DIAGNOSIS — E119 Type 2 diabetes mellitus without complications: Secondary | ICD-10-CM

## 2022-06-08 MED ORDER — DEXCOM G7 RECEIVER DEVI: 1.0000 | 0 refills | Status: AC

## 2022-06-08 MED ORDER — TRULICITY 3 MG/0.5ML ~~LOC~~ SOAJ: 3.0000 mg | SUBCUTANEOUS | 5 refills | Status: DC

## 2022-06-08 NOTE — Telephone Encounter (Signed)
Patient called to follow up on refill request from pharmacy. Patient stated Dr. Dennard Schaumann discussed changing him to [facility] but it's on back order. Since his blood sugar level is elevated, patient requesting for Dr. Dennard Schaumann to increase his dose of trulicity.   Please advise patient at [phone].

## 2022-06-08 NOTE — Telephone Encounter (Signed)
Prescription Request  06/08/2022  Is this a "Controlled Substance" medicine? No  LOV: 04/30/2022  What is the name of the medication or equipment?   TRULICITY 1.5 ZS/0.1UX SOPN   Have you contacted your pharmacy to request a refill? Yes   Which pharmacy would you like this sent to?  [id number] [id number]: [phone]: [phone]   Patient notified that their request is being sent to the clinical staff for review and that they should receive a response within 2 business days.   Please advise pharmacist at [phone]

## 2022-06-08 NOTE — Telephone Encounter (Signed)
Patient called to follow up on refill request for  Continuous Blood Gluc Sensor ([place]) [place] [[id number]]   Stated pharmacist told him he needs a new script.  Ph[facility]:  [id number] [id number]: [phone] Fax: [phone] #: [id number]  Please advise patient at [phone]

## 2022-06-17 ENCOUNTER — Other Ambulatory Visit: Payer: Self-pay | Admitting: Family Medicine

## 2022-06-17 ENCOUNTER — Telehealth: Payer: Self-pay | Admitting: Family Medicine

## 2022-06-17 NOTE — Telephone Encounter (Signed)
Erroneous encounter. Please disregard.

## 2022-06-18 ENCOUNTER — Encounter: Payer: Self-pay | Admitting: Family Medicine

## 2022-06-18 ENCOUNTER — Other Ambulatory Visit: Payer: Self-pay

## 2022-06-18 DIAGNOSIS — E119 Type 2 diabetes mellitus without complications: Secondary | ICD-10-CM

## 2022-06-18 MED ORDER — DEXCOM G7 SENSOR MISC: 1.0000 | 3 refills | Status: DC

## 2022-06-18 NOTE — Telephone Encounter (Signed)
Requested Prescriptions  Pending Prescriptions Disp Refills   Continuous Blood Gluc Sensor ([place]) MISC [Pharmacy Med Name: DEXCOM G7 SENSOR] 3 each 3    Sig: 1 DEVICE BY DOES NOT APPLY ROUTE AS DIRECTED.     Endocrinology: Diabetes - Testing Supplies Failed - 06/17/2022  2:05 PM      Failed - Valid encounter within last 12 months    Recent Outpatient Visits           7 months ago Encounter for Medicare annual wellness exam   [place] Susy Frizzle, MD   1 year ago Controlled type 2 diabetes mellitus without complication, with long-term current use of insulin ([place])   [place] Dennard Schaumann, [name], MD   2 years ago Right hip pain   [place] Dennard Schaumann, [name], MD   3 years ago Routine general medical examination at a health care facility   [place], [name], MD   4 years ago Controlled type 2 diabetes mellitus without complication, with long-term current use of insulin ([place])   [facility] [place], [name], MD

## 2022-06-24 NOTE — Patient Instructions (Incomplete)
Curtis Hunt , Thank you for taking time to come for your Medicare Wellness Visit. I appreciate your ongoing commitment to your health goals. Please review the following plan we discussed and let me know if I can assist you in the future.   These are the goals we discussed:  Goals      Exercise 3x per week (30 min per time)     Increase exercise. Home improvements        This is a list of the screening recommended for you and due dates:  Health Maintenance  Topic Date Due   COVID-19 Vaccine (5 - 2023-24 season) 01/23/2022   Flu Shot  08/23/2022*   Hemoglobin A1C  10/21/2022   Eye exam for diabetics  01/16/2023   Yearly kidney function blood test for diabetes  04/23/2023   Yearly kidney health urinalysis for diabetes  04/23/2023   Complete foot exam   05/01/2023   Medicare Annual Wellness Visit  06/26/2023   Colon Cancer Screening  01/01/2029   Pneumonia Vaccine  Completed   Hepatitis C Screening: USPSTF Recommendation to screen - Ages 22-79 yo.  Completed   Zoster (Shingles) Vaccine  Completed   HPV Vaccine  Aged Out   DTaP/Tdap/Td vaccine  Discontinued  *Topic was postponed. The date shown is not the original due date.    Advanced directives: Advance directive discussed with you today. I have provided a copy for you to complete at home and have notarized. Once this is complete please bring a copy in to our office so we can scan it into your chart.   Conditions/risks identified: Aim for 30 minutes of exercise or brisk walking, 6-8 glasses of water, and 5 servings of fruits and vegetables each day.   Next appointment: Follow up in one year for your annual wellness visit.   Preventive Care 3 Years and Older, Male  Preventive care refers to lifestyle choices and visits with your health care provider that can promote health and wellness. What does preventive care include? A yearly physical exam. This is also called an annual well check. Dental exams once or twice a  year. Routine eye exams. Ask your health care provider how often you should have your eyes checked. Personal lifestyle choices, including: Daily care of your teeth and gums. Regular physical activity. Eating a healthy diet. Avoiding tobacco and drug use. Limiting alcohol use. Practicing safe sex. Taking low doses of aspirin every day. Taking vitamin and mineral supplements as recommended by your health care provider. What happens during an annual well check? The services and screenings done by your health care provider during your annual well check will depend on your age, overall health, lifestyle risk factors, and family history of disease. Counseling  Your health care provider may ask you questions about your: Alcohol use. Tobacco use. Drug use. Emotional well-being. Home and relationship well-being. Sexual activity. Eating habits. History of falls. Memory and ability to understand (cognition). Work and work Statistician. Screening  You may have the following tests or measurements: Height, weight, and BMI. Blood pressure. Lipid and cholesterol levels. These may be checked every 5 years, or more frequently if you are over 5 years old. Skin check. Lung cancer screening. You may have this screening every year starting at age 14 if you have a 30-pack-year history of smoking and currently smoke or have quit within the past 15 years. Fecal occult blood test (FOBT) of the stool. You may have this test every year starting at age 53.  Flexible sigmoidoscopy or colonoscopy. You may have a sigmoidoscopy every 5 years or a colonoscopy every 10 years starting at age 32. Prostate cancer screening. Recommendations will vary depending on your family history and other risks. Hepatitis C blood test. Hepatitis B blood test. Sexually transmitted disease (STD) testing. Diabetes screening. This is done by checking your blood sugar (glucose) after you have not eaten for a while (fasting). You may  have this done every 1-3 years. Abdominal aortic aneurysm (AAA) screening. You may need this if you are a current or former smoker. Osteoporosis. You may be screened starting at age 14 if you are at high risk. Talk with your health care provider about your test results, treatment options, and if necessary, the need for more tests. Vaccines  Your health care provider may recommend certain vaccines, such as: Influenza vaccine. This is recommended every year. Tetanus, diphtheria, and acellular pertussis (Tdap, Td) vaccine. You may need a Td booster every 10 years. Zoster vaccine. You may need this after age 37. Pneumococcal 13-valent conjugate (PCV13) vaccine. One dose is recommended after age 27. Pneumococcal polysaccharide (PPSV23) vaccine. One dose is recommended after age 27. Talk to your health care provider about which screenings and vaccines you need and how often you need them. This information is not intended to replace advice given to you by your health care provider. Make sure you discuss any questions you have with your health care provider. Document Released: 06/07/2015 Document Revised: 01/29/2016 Document Reviewed: 03/12/2015 Elsevier Interactive Patient Education  2017 [place] Prevention in the Home Falls can cause injuries. They can happen to people of all ages. There are many things you can do to make your home safe and to help prevent falls. What can I do on the outside of my home? Regularly fix the edges of walkways and driveways and fix any cracks. Remove anything that might make you trip as you walk through a door, such as a raised step or threshold. Trim any bushes or trees on the path to your home. Use bright outdoor lighting. Clear any walking paths of anything that might make someone trip, such as rocks or tools. Regularly check to see if handrails are loose or broken. Make sure that both sides of any steps have handrails. Any raised decks and porches  should have guardrails on the edges. Have any leaves, snow, or ice cleared regularly. Use sand or salt on walking paths during winter. Clean up any spills in your garage right away. This includes oil or grease spills. What can I do in the bathroom? Use night lights. Install grab bars by the toilet and in the tub and shower. Do not use towel bars as grab bars. Use non-skid mats or decals in the tub or shower. If you need to sit down in the shower, use a plastic, non-slip stool. Keep the floor dry. Clean up any water that spills on the floor as soon as it happens. Remove soap buildup in the tub or shower regularly. Attach bath mats securely with double-sided non-slip rug tape. Do not have throw rugs and other things on the floor that can make you trip. What can I do in the bedroom? Use night lights. Make sure that you have a light by your bed that is easy to reach. Do not use any sheets or blankets that are too big for your bed. They should not hang down onto the floor. Have a firm chair that has side arms. You can use this  for support while you get dressed. Do not have throw rugs and other things on the floor that can make you trip. What can I do in the kitchen? Clean up any spills right away. Avoid walking on wet floors. Keep items that you use a lot in easy-to-reach places. If you need to reach something above you, use a strong step stool that has a grab bar. Keep electrical cords out of the way. Do not use floor polish or wax that makes floors slippery. If you must use wax, use non-skid floor wax. Do not have throw rugs and other things on the floor that can make you trip. What can I do with my stairs? Do not leave any items on the stairs. Make sure that there are handrails on both sides of the stairs and use them. Fix handrails that are broken or loose. Make sure that handrails are as long as the stairways. Check any carpeting to make sure that it is firmly attached to the stairs.  Fix any carpet that is loose or worn. Avoid having throw rugs at the top or bottom of the stairs. If you do have throw rugs, attach them to the floor with carpet tape. Make sure that you have a light switch at the top of the stairs and the bottom of the stairs. If you do not have them, ask someone to add them for you. What else can I do to help prevent falls? Wear shoes that: Do not have high heels. Have rubber bottoms. Are comfortable and fit you well. Are closed at the toe. Do not wear sandals. If you use a stepladder: Make sure that it is fully opened. Do not climb a closed stepladder. Make sure that both sides of the stepladder are locked into place. Ask someone to hold it for you, if possible. Clearly mark and make sure that you can see: Any grab bars or handrails. First and last steps. Where the edge of each step is. Use tools that help you move around (mobility aids) if they are needed. These include: Canes. Walkers. Scooters. Crutches. Turn on the lights when you go into a dark area. Replace any light bulbs as soon as they burn out. Set up your furniture so you have a clear path. Avoid moving your furniture around. If any of your floors are uneven, fix them. If there are any pets around you, be aware of where they are. Review your medicines with your doctor. Some medicines can make you feel dizzy. This can increase your chance of falling. Ask your doctor what other things that you can do to help prevent falls. This information is not intended to replace advice given to you by your health care provider. Make sure you discuss any questions you have with your health care provider. Document Released: 03/07/2009 Document Revised: 10/17/2015 Document Reviewed: 06/15/2014 Elsevier Interactive Patient Education  2017 [employer].

## 2022-06-24 NOTE — Progress Notes (Signed)
Subjective:   Curtis Hunt is a 72 y.o. male who presents for Medicare Annual/Subsequent preventive examination.  I connected with  Curtis Hunt on 06/25/22 by a audio enabled telemedicine application and verified that I am speaking with the correct person using two identifiers.  Patient Location: Home  Provider Location: Office/Clinic  I discussed the limitations of evaluation and management by telemedicine. The patient expressed understanding and agreed to proceed.  Review of Systems     Cardiac Risk Factors include: male gender;hypertension;dyslipidemia;diabetes mellitus;advanced age (>19mn, >>52women)     Objective:    Today's Vitals   06/25/22 0826  Weight: 278 lb (126.1 kg)  Height: '6\' 1"'$  (1.854 m)   Body mass index is 36.68 kg/m.     06/25/2022    8:47 AM 06/19/2021    8:35 AM 09/30/2020    3:50 PM 09/26/2020   10:14 AM 12/18/2019    5:45 PM 12/14/2019    9:56 AM 09/03/2015   11:52 AM  Advanced Directives  Does Patient Have a Medical Advance Directive? No No No No No No No  Would patient like information on creating a medical advance directive? Yes (MAU/Ambulatory/Procedural Areas - Information given) No - Patient declined No - Patient declined No - Patient declined No - Patient declined  No - patient declined information    Current Medications (verified) Outpatient Encounter Medications as of 06/25/2022  Medication Sig   aspirin EC 81 MG tablet Take 1 tablet (81 mg total) by mouth 2 (two) times daily. To be taken after surgery   B-D UF III MINI PEN NEEDLES 31G X 5 MM MISC USE AS DIRECTED TWICE A DAY   Blood Glucose Monitoring Suppl (ONE TOUCH ULTRA 2) w/Device KIT ONE TOUCH ULTRA 2 GLUCOSE SYSTEM W/ DEVICE KIT   Continuous Blood Gluc Receiver (DEXCOM G7 RECEIVER) DEVI 1 Device by Does not apply route as directed.   Continuous Blood Gluc Sensor (DEXCOM G7 SENSOR) MISC 1 Device by Does not apply route as directed.   Dulaglutide (TRULICITY) 3 MKW/4.0XBSOPN Inject 3  mg as directed once a week.   glucose blood (ONETOUCH ULTRA) test strip CHECK SUGAR 4 TO 5 TIMES DAILY EVERY MORNING, AFTER MEALS, AND AT BEDTIME E11.65   insulin detemir (LEVEMIR FLEXPEN) 100 UNIT/ML FlexPen Inject 22U daily at 10pm   losartan (COZAAR) 50 MG tablet Take 1 tablet (50 mg total) by mouth daily.   rosuvastatin (CRESTOR) 40 MG tablet TAKE 1 TABLET BY MOUTH EVERY DAY   sildenafil (VIAGRA) 100 MG tablet TAKE 0.5-1 TABLETS BY MOUTH DAILY AS NEEDED FOR ERECTILE DYSFUNCTION.   traZODone (DESYREL) 50 MG tablet Take 1 tablet (50 mg total) by mouth at bedtime as needed for sleep.   [DISCONTINUED] COMIRNATY SUSP injection    [DISCONTINUED] FLUZONE HIGH-DOSE QUADRIVALENT 0.7 ML SUSY    No facility-administered encounter medications on file as of 06/25/2022.    Allergies (verified) Patient has no known allergies.   History: Past Medical History:  Diagnosis Date   Arthritis    [place]   Prostate CA with surgery   [place]    Gout    Hyperlipidemia    [facility]    Past Surgical History:  Procedure Laterality Date   JOINT REPLACEMENT Right 12/18/2019   total hip replacement w/ Dr. XErlinda Hong  PROSTATE SURGERY     TOTAL HIP ARTHROPLASTY Right 12/18/2019   Procedure: RIGHT TOTAL HIP ARTHROPLASTY ANTERIOR APPROACH;  Surgeon: XErlinda Hong  Marylynn Pearson, MD;  Location: [facility];  Service: Orthopedics;  Laterality: Right;   TOTAL HIP ARTHROPLASTY Left 09/30/2020   Procedure: LEFT TOTAL HIP ARTHROPLASTY ANTERIOR APPROACH;  Surgeon: Leandrew Koyanagi, MD;  Location: [facility];  Service: Orthopedics;  Laterality: Left;   Family History  Problem Relation Age of Onset   Cancer Brother        prostate   Cancer Mother        breast ca   Social History   Socioeconomic History   Marital status: Married    Spouse name: Not on file   Number of children: 3   Years of education: Not on file   Highest education level: Not on file  Occupational History   Not on  file  Tobacco Use   Smoking status: Never   Smokeless tobacco: Never  Vaping Use   Vaping Use: Never used  Substance and Sexual Activity   Alcohol use: Yes    Comment: Beer prior to diagnosis of diabetes, about once a week during sports on tv   Drug use: No   Sexual activity: Not Currently  Other Topics Concern   Not on file  Social History Narrative   5 grandchildren and 2 great grandchildren.   1st wife died in 2015-08-14, married x 45 years.   Remarried 2nd wife 12/2020.    Social Determinants of Health   Financial Resource Strain: Low Risk  (06/25/2022)   Overall Financial Resource Strain (CARDIA)    Difficulty of Paying Living Expenses: Not hard at all  Food Insecurity: No Food Insecurity (06/25/2022)   Hunger Vital Sign    Worried About Running Out of Food in the Last Year: Never true    Ran Out of Food in the Last Year: Never true  Transportation Needs: No Transportation Needs (06/25/2022)   PRAPARE - Hydrologist (Medical): No    Lack of Transportation (Non-Medical): No  Physical Activity: Insufficiently Active (06/25/2022)   Exercise Vital Sign    Days of Exercise per Week: 3 days    Minutes of Exercise per Session: 30 min  Stress: No Stress Concern Present (06/25/2022)   [place]    Feeling of Stress : Not at all  Social Connections: [place] (06/25/2022)   Social Connection and Isolation Panel [NHANES]    Frequency of Communication with Friends and Family: More than three times a week    Frequency of Social Gatherings with Friends and Family: Three times a week    Attends Religious Services: More than 4 times per year    Active Member of Clubs or Organizations: Yes    Attends Music therapist: More than 4 times per year    Marital Status: Married    Tobacco Counseling Counseling given: Not Answered   Clinical Intake:  Pre-visit preparation completed:  Yes  Pain : No/denies pain  Diabetes: Yes CBG done?: No Did pt. bring in CBG monitor from home?: No  How often do you need to have someone help you when you read instructions, pamphlets, or other written materials from your doctor or pharmacy?: 1 - Never  Diabetic?Yes  Nutrition Risk Assessment:  Has the patient had any N/V/D within the last 2 months?  No  Does the patient have any non-healing wounds?  No  Has the patient had any unintentional weight loss or weight gain?  No   Diabetes:  Is the patient diabetic?  Yes  If diabetic, was a CBG obtained today?  No  Did the patient bring in their glucometer from home?  No  How often do you monitor your CBG's? Dexcom .   Financial Strains and Diabetes Management:  Are you having any financial strains with the device, your supplies or your medication? No .  Does the patient want to be seen by Chronic Care Management for management of their diabetes?  No  Would the patient like to be referred to a Nutritionist or for Diabetic Management?  No   Diabetic Exams:  Diabetic Eye Exam: Completed 01/15/22 Diabetic Foot Exam: Completed 04/30/22    Interpreter Needed?: No  Information entered by :: Denman George LPN   Activities of Daily Living    06/25/2022    8:47 AM  In your present state of health, do you have any difficulty performing the following activities:  Hearing? 0  Vision? 0  Difficulty concentrating or making decisions? 0  Walking or climbing stairs? 0  Dressing or bathing? 0  Doing errands, shopping? 0  Preparing Food and eating ? N  Using the Toilet? N  In the past six months, have you accidently leaked urine? N  Do you have problems with loss of bowel control? N  Managing your Medications? N  Managing your Finances? N  Housekeeping or managing your Housekeeping? N    Patient Care Team: Susy Frizzle, MD as PCP - General (Family Medicine) Edythe Clarity, [facility] as Pharmacist (Pharmacist)  Indicate any  recent Medical Services you may have received from other than [facility] providers in the past year (date may be approximate).     Assessment:   This is a routine wellness examination for [place].  Hearing/Vision screen Hearing Screening - Comments:: Denies hearing difficulties   Vision Screening - Comments:: Wears rx glasses - up to date with routine eye exams with [place] issues and exercise activities discussed: Current Exercise Habits: Home exercise routine, Type of exercise: walking, Time (Minutes): 30, Frequency (Times/Week): 3, Weekly Exercise (Minutes/Week): 90, Intensity: Mild   Goals Addressed             This Visit's Progress    COMPLETED: Pharmacy Care Plan:       CARE PLAN ENTRY (see longitudinal plan of care for additional care plan information)  Current Barriers:  Chronic Disease Management support, education, and care coordination needs related to Diabetes, GERD, Gout, and dyslipidemia  Dyslipidemia Lab Results  Component Value Date/Time   LDLCALC 71 10/24/2019 08:50 AM  Pharmacist Clinical Goal(s): Over the next 180 days, patient will work with PharmD and providers to maintain LDL goal < 70 Current regimen:  Rosuvastatin '40mg'$  Interventions: Reviewed most recent lipid panel Counseled on importance of statin medications for overall heart benefit Patient self care activities - Over the next 180 days, patient will: Continue to take medication as directed Focus on medication adherence by pill count  Diabetes Lab Results  Component Value Date/Time   HGBA1C 6.1 (H) 10/24/2019 08:50 AM   HGBA1C 6.4 (H) 01/17/2019 08:10 AM  Pharmacist Clinical Goal(s): Over the next 180 days, patient will work with PharmD and providers to maintain A1c goal <7% Current regimen:  Trulicity 1.'5mg'$  weekly Levemir 25 units daily Interventions: Reviewed home blood sugar readings Reviewed most recent A1c  Discussed current diet Requested Rx for meter and test  strips Patient self care activities - Over the next 180 days, patient will: Check blood sugar once daily,  document, and provide at future appointments Contact provider with any episodes of hypoglycemia Contact provider should you not be able to afford either medication for blood sugar  GERD Pharmacist Clinical Goal(s) Over the next 180 days, patient will work with PharmD and providers to optimize medication and minimize symptoms related to GERD. Current regimen:  None Interventions: Discussed frequency of symptoms. Discussed trigger foods. Patient self care activities - Over the next 180 days, patient will: Contact provider with any increase in symptom frequency. May take OTC Tums periodically for symptom relief. Gout Pharmacist Clinical Goal(s) Over the next 180 days, patient will work with PharmD and providers to optimize medication and minimize symptoms related to gout. Current regimen:  No medications currently Interventions: Discussed frequency of gout symptoms Patient self care activities - Over the next 180 days, patient will: Continue to take medication as directed Contact providers with increase in symptoms   Please see past updates related to this goal by clicking on the "Past Updates" button in the selected goal         Depression Screen    06/25/2022    8:44 AM 04/30/2022    9:01 AM 06/19/2021    8:31 AM 10/25/2020   10:58 AM 01/20/2019    9:25 AM 09/24/2017    9:42 AM 09/24/2017    8:27 AM  PHQ 2/9 Scores  PHQ - 2 Score 0 0 0 0 0 0 0    Fall Risk    06/25/2022    8:36 AM 04/30/2022    9:01 AM 06/19/2021    8:36 AM 10/25/2020   10:58 AM 09/24/2017    9:42 AM  Fall Risk   Falls in the past year? 0 0 0 0 No  Number falls in past yr: 0 0 0 0   Injury with Fall? 0 0 0 0   Risk for fall due to :  No Fall Risks No Fall Risks No Fall Risks   Follow up Falls prevention discussed;Education provided;Falls evaluation completed Falls prevention discussed Falls prevention  discussed Falls evaluation completed     FALL RISK PREVENTION PERTAINING TO THE HOME:  Any stairs in or around the home? Yes  If so, are there any without handrails? No  Home free of loose throw rugs in walkways, pet beds, electrical cords, etc? Yes  Adequate lighting in your home to reduce risk of falls? Yes   ASSISTIVE DEVICES UTILIZED TO PREVENT FALLS:  Life alert? No  Use of a cane, walker or w/c? No  Grab bars in the bathroom? Yes  Shower chair or bench in shower? No  Elevated toilet seat or a handicapped toilet? Yes   TIMED UP AND GO:  Was the test performed? No . Telephonic visit   Cognitive Function:        06/25/2022    8:47 AM 06/19/2021    8:39 AM  6CIT Screen  What Year? 0 points 0 points  What month? 0 points 0 points  What time? 0 points 0 points  Count back from 20 0 points 0 points  Months in reverse 0 points 0 points  Repeat phrase 0 points 0 points  Total Score 0 points 0 points    Immunizations Immunization History  Administered Date(s) Administered   Fluad Quad(high Dose 65+) 02/14/2019, 02/22/2020, 02/12/2021   Influenza Split 02/22/2013, 02/22/2014   Influenza-Unspecified 02/19/2015, 02/13/2016, 02/22/2017   PFIZER(Purple Top)SARS-COV-2 Vaccination 06/19/2019, 07/10/2019, 04/05/2020   PPD Test 09/13/2012, 08/02/2013, 08/20/2014, 09/03/2015, 09/21/2016   Pfizer Covid-19  Vaccine Bivalent Booster 34yr & up 02/12/2021   Pneumococcal Conjugate-13 09/24/2017   Pneumococcal Polysaccharide-23 05/11/2014, 02/22/2020   Tdap 09/10/2010   Zoster Recombinat (Shingrix) 12/18/2021, 02/25/2022   Zoster, Live 09/13/2012    TDAP status: Due, Education has been provided regarding the importance of this vaccine. Advised may receive this vaccine at local pharmacy or Health Dept. Aware to provide a copy of the vaccination record if obtained from local pharmacy or Health Dept. Verbalized acceptance and understanding.  Flu Vaccine status: Up to  date  Pneumococcal vaccine status: Up to date  Covid-19 vaccine status: Completed vaccines  Qualifies for Shingles Vaccine? Yes   Zostavax completed Yes   Shingrix Completed?: Yes  Screening Tests Health Maintenance  Topic Date Due   COVID-19 Vaccine (5 - 2023-24 season) 01/23/2022   INFLUENZA VACCINE  08/23/2022 (Originally 12/23/2021)   HEMOGLOBIN A1C  10/21/2022   OPHTHALMOLOGY EXAM  01/16/2023   Diabetic kidney evaluation - eGFR measurement  04/23/2023   Diabetic kidney evaluation - Urine ACR  04/23/2023   FOOT EXAM  05/01/2023   Medicare Annual Wellness (AWV)  06/26/2023   COLONOSCOPY (Pts 45-457yrInsurance coverage will need to be confirmed)  01/01/2029   Pneumonia Vaccine 6510Years old  Completed   Hepatitis C Screening  Completed   Zoster Vaccines- Shingrix  Completed   HPV VACCINES  Aged Out   DTaP/Tdap/Td  Discontinued    Health Maintenance  Health Maintenance Due  Topic Date Due   COVID-19 Vaccine (5 - 2023-24 season) 01/23/2022    Colorectal cancer screening: Type of screening: Colonoscopy. Completed 01/02/19. Repeat every 10 years  Lung Cancer Screening: (Low Dose CT Chest recommended if Age 73-80ears, 30 pack-year currently smoking OR have quit w/in 15years.) does not qualify.   Lung Cancer Screening Referral: n/a   Additional Screening:  Hepatitis C Screening: does qualify; Completed 09/21/17  Vision Screening: Recommended annual ophthalmology exams for early detection of glaucoma and other disorders of the eye. Is the patient up to date with their annual eye exam?  Yes  Who is the provider or what is the name of the office in which the patient attends annual eye exams? [place] is not established with a provider, would they like to be referred to a provider to establish care? No .   Dental Screening: Recommended annual dental exams for proper oral hygiene  Community Resource Referral / Chronic Care Management: CRR required this visit?  No    CCM required this visit?  No      Plan:     I have personally reviewed and noted the following in the patient's chart:   Medical and social history Use of alcohol, tobacco or illicit drugs  Current medications and supplements including opioid prescriptions. Patient is not currently taking opioid prescriptions. Functional ability and status Nutritional status Physical activity Advanced directives List of other physicians Hospitalizations, surgeries, and ER visits in previous 12 months Vitals Screenings to include cognitive, depression, and falls Referrals and appointments  In addition, I have reviewed and discussed with patient certain preventive protocols, quality metrics, and best practice recommendations. A written personalized care plan for preventive services as well as general preventive health recommendations were provided to patient.     SlVanetta MuldersLPWyoming 2/01/28/6212 Due to this being a virtual visit, the after visit summary with patients personalized plan was offered to patient via mail or my-chart. per request, patient was mailed a copy of  AVS  Nurse Notes: No concerns

## 2022-06-25 ENCOUNTER — Ambulatory Visit (INDEPENDENT_AMBULATORY_CARE_PROVIDER_SITE_OTHER): Payer: Medicare Other

## 2022-06-25 VITALS — Ht 73.0 in | Wt 278.0 lb

## 2022-06-25 DIAGNOSIS — Z0000 Encounter for general adult medical examination without abnormal findings: Secondary | ICD-10-CM | POA: Diagnosis not present

## 2022-06-30 DIAGNOSIS — H2513 Age-related nuclear cataract, bilateral: Secondary | ICD-10-CM | POA: Diagnosis not present

## 2022-06-30 DIAGNOSIS — H43813 Vitreous degeneration, bilateral: Secondary | ICD-10-CM | POA: Diagnosis not present

## 2022-06-30 DIAGNOSIS — H5021 Vertical strabismus, right eye: Secondary | ICD-10-CM | POA: Diagnosis not present

## 2022-06-30 DIAGNOSIS — E119 Type 2 diabetes mellitus without complications: Secondary | ICD-10-CM | POA: Diagnosis not present

## 2022-07-14 ENCOUNTER — Other Ambulatory Visit: Payer: Self-pay | Admitting: Family Medicine

## 2022-07-17 ENCOUNTER — Encounter: Payer: Self-pay | Admitting: Family Medicine

## 2022-07-22 ENCOUNTER — Telehealth: Payer: Self-pay

## 2022-07-22 NOTE — Telephone Encounter (Signed)
PRIOR AUTHORIZATION FOR [place] (Key: [id number])   Your information has been sent to [facility].

## 2022-07-30 ENCOUNTER — Other Ambulatory Visit: Payer: Self-pay | Admitting: Family Medicine

## 2022-07-30 MED ORDER — TRULICITY 3 MG/0.5ML ~~LOC~~ SOAJ: 3.0000 mg | SUBCUTANEOUS | 4 refills | Status: DC

## 2022-07-30 NOTE — Telephone Encounter (Signed)
Prescription Request  07/30/2022  LOV: 04/30/2022  What is the name of the medication or equipment?   Dulaglutide (TRULICITY) 3 0000000 SOPN   Have you contacted your pharmacy to request a refill? yes   Which pharmacy would you like this sent to?  [id number] [id number]: [phone]: [phone]   Patient notified that their request is being sent to the clinical staff for review and that they should receive a response within 2 business days.   Please advise pharmacist at [phone]

## 2022-07-30 NOTE — Telephone Encounter (Signed)
Last OV 04/30/22.  Requested Prescriptions  Pending Prescriptions Disp Refills   Dulaglutide (TRULICITY) 3 0000000 SOPN 2 mL 4    Sig: Inject 3 mg as directed once a week.     Endocrinology:  Diabetes - GLP-1 Receptor Agonists Failed - 07/30/2022  3:54 PM      Failed - Valid encounter within last 6 months    Recent Outpatient Visits           9 months ago Encounter for Medicare annual wellness exam   [place] Susy Frizzle, MD   1 year ago Controlled type 2 diabetes mellitus without complication, with long-term current use of insulin ([place])   [place] Susy Frizzle, MD   2 years ago Right hip pain   [place] Susy Frizzle, MD   3 years ago Routine general medical examination at a health care facility   [place], [name], MD   4 years ago Controlled type 2 diabetes mellitus without complication, with long-term current use of insulin ([place])   [facility] [place], [name], MD              Passed - HBA1C is between 0 and 7.9 and within 180 days    Hgb A1c MFr Bld  Date Value Ref Range Status  04/22/2022 7.5 (H) <5.7 % of total Hgb Final    Comment:    For someone without known diabetes, a hemoglobin A1c value of 6.5% or greater indicates that they may have  diabetes and this should be confirmed with a follow-up  test. . For someone with known diabetes, a value <7% indicates  that their diabetes is well controlled and a value  greater than or equal to 7% indicates suboptimal  control. A1c targets should be individualized based on  duration of diabetes, age, comorbid conditions, and  other considerations. . Currently, no consensus exists regarding use of hemoglobin A1c for diagnosis of diabetes for children. Marland Kitchen

## 2022-08-04 NOTE — Telephone Encounter (Signed)
Received refill request from pharmacy for Dulaglutide (TRULICITY) 3 0000000 SOPN .  Please advise pharmacist at [phone].

## 2022-09-05 NOTE — Progress Notes (Signed)
Pt had not had BP meds this morning.

## 2022-09-22 ENCOUNTER — Encounter: Payer: Self-pay | Admitting: Family Medicine

## 2022-09-22 ENCOUNTER — Ambulatory Visit (INDEPENDENT_AMBULATORY_CARE_PROVIDER_SITE_OTHER): Payer: Medicare Other | Admitting: Family Medicine

## 2022-09-22 VITALS — BP 144/86 | HR 107 | Temp 98.5°F | Wt 280.6 lb

## 2022-09-22 DIAGNOSIS — I10 Essential (primary) hypertension: Secondary | ICD-10-CM

## 2022-09-22 NOTE — Progress Notes (Signed)
Subjective:    Patient ID: Curtis Hunt, male    DOB: 12-23-1950, 72 y.o.   MRN: [id number]  Hypertension  Patient is a very pleasant 72 year old African-American gentleman who presents today with an elevated blood pressure.  He was feeling fine this morning but he checked his blood pressure and found it to be greater than 180 systolic.  He thinks it was read dyspnea.  He has been under more stress with his grandchildren.  He also admits that he has been eating more sodium in the form of soy sauce.  He has been trying to exercise and walk every day.  Here today I checked his blood pressure and found it to be 152/70.  By nurse found it to be 144 systolic.  Past Medical History:  Diagnosis Date   Arthritis    Cancer (HCC) 2004   Prostate CA with surgery   Diabetes mellitus without complication (HCC)    Gout    Hyperlipidemia    Prostate cancer [facility])    Past Surgical History:  Procedure Laterality Date   JOINT REPLACEMENT Right 12/18/2019   total hip replacement w/ Dr. Roda Shutters   PROSTATE SURGERY     TOTAL HIP ARTHROPLASTY Right 12/18/2019   Procedure: RIGHT TOTAL HIP ARTHROPLASTY ANTERIOR APPROACH;  Surgeon: Tarry Kos, MD;  [facility];  Service: Orthopedics;  Laterality: Right;   TOTAL HIP ARTHROPLASTY Left 09/30/2020   Procedure: LEFT TOTAL HIP ARTHROPLASTY ANTERIOR APPROACH;  Surgeon: Tarry Kos, MD;  Location: [facility] OR;  Service: Orthopedics;  Laterality: Left;   Current Outpatient Medications on File Prior to Visit  Medication Sig Dispense Refill   aspirin EC 81 MG tablet Take 1 tablet (81 mg total) by mouth 2 (two) times daily. To be taken after surgery 84 tablet 0   B-D UF III MINI PEN NEEDLES 31G X 5 MM MISC USE AS DIRECTED TWICE A DAY 100 each 3   Blood Glucose Monitoring Suppl (ONE TOUCH ULTRA 2) w/Device KIT ONE TOUCH ULTRA 2 GLUCOSE SYSTEM W/ DEVICE KIT 1 kit 0   Continuous Blood Gluc Receiver (DEXCOM G7 RECEIVER) DEVI 1 Device by Does not apply route as directed. 1  each 0   Continuous Blood Gluc Sensor (DEXCOM G7 SENSOR) MISC 1 Device by Does not apply route as directed. 3 each 3   Dulaglutide (TRULICITY) 3 MG/0.5ML SOPN Inject 3 mg as directed once a week. 2 mL 4   glucose blood (ONETOUCH ULTRA) test strip CHECK SUGAR 4 TO 5 TIMES DAILY EVERY MORNING, AFTER MEALS, AND AT BEDTIME E11.65 100 strip 11   insulin detemir (LEVEMIR FLEXPEN) 100 UNIT/ML FlexPen INJECT 22 UNITS DAILY AT 10PM 15 mL 11   losartan (COZAAR) 50 MG tablet Take 1 tablet (50 mg total) by mouth daily. 90 tablet 3   rosuvastatin (CRESTOR) 40 MG tablet TAKE 1 TABLET BY MOUTH EVERY DAY 90 tablet 3   sildenafil (VIAGRA) 100 MG tablet TAKE 0.5-1 TABLETS BY MOUTH DAILY AS NEEDED FOR ERECTILE DYSFUNCTION. 4 tablet 14   traZODone (DESYREL) 50 MG tablet Take 1 tablet (50 mg total) by mouth at bedtime as needed for sleep. 30 tablet 2   No current facility-administered medications on file prior to visit.   No Known Allergies Social History   Socioeconomic History   Marital status: Married    Spouse name: Not on file   Number of children: 3   Years of education: Not on file   Highest education level: Not on  file  Occupational History   Not on file  Tobacco Use   Smoking status: Never   Smokeless tobacco: Never  Vaping Use   Vaping Use: Never used  Substance and Sexual Activity   Alcohol use: Yes    Comment: Beer prior to diagnosis of diabetes, about once a week during sports on tv   Drug use: No   Sexual activity: Not Currently  Other Topics Concern   Not on file  Social History Narrative   5 grandchildren and 2 great grandchildren.   1st wife died in 2015/10/15, married x 45 years.   Remarried 2nd wife 12/2020.    Social Determinants of Health   Financial Resource Strain: Low Risk  (06/25/2022)   Overall Financial Resource Strain (CARDIA)    Difficulty of Paying Living Expenses: Not hard at all  Food Insecurity: No Food Insecurity (09/05/2022)   Hunger Vital Sign    Worried About  Running Out of Food in the Last Year: Never true    Ran Out of Food in the Last Year: Never true  Transportation Needs: No Transportation Needs (09/05/2022)   PRAPARE - Administrator, Civil Service (Medical): No    Lack of Transportation (Non-Medical): No  Physical Activity: Insufficiently Active (06/25/2022)   Exercise Vital Sign    Days of Exercise per Week: 3 days    Minutes of Exercise per Session: 30 min  Stress: No Stress Concern Present (06/25/2022)   [employer] of Occupational Health - Occupational Stress Questionnaire    Feeling of Stress : Not at all  Social Connections: Socially Integrated (06/25/2022)   Social Connection and Isolation Panel [NHANES]    Frequency of Communication with Friends and Family: More than three times a week    Frequency of Social Gatherings with Friends and Family: Three times a week    Attends Religious Services: More than 4 times per year    Active Member of Clubs or Organizations: Yes    Attends Banker Meetings: More than 4 times per year    Marital Status: Married  Catering manager Violence: Not At Risk (09/05/2022)   Humiliation, Afraid, Rape, and Kick questionnaire    Fear of Current or Ex-Partner: No    Emotionally Abused: No    Physically Abused: No    Sexually Abused: No   Family History  Problem Relation Age of Onset   Cancer Brother        prostate   Cancer Mother        breast ca     Review of Systems  All other systems reviewed and are negative.      Objective:   Physical Exam Vitals reviewed.  Constitutional:      General: He is not in acute distress.    Appearance: He is well-developed. He is not diaphoretic.  HENT:     Head: Normocephalic and atraumatic.     Right Ear: External ear normal.     Left Ear: External ear normal.     Nose: Nose normal.     Mouth/Throat:     Pharynx: No oropharyngeal exudate.  Eyes:     General: No scleral icterus.       Right eye: No discharge.         Left eye: No discharge.     Conjunctiva/sclera: Conjunctivae normal.     Pupils: Pupils are equal, round, and reactive to light.  Neck:     Thyroid: No thyromegaly.  Vascular: No JVD.     Trachea: No tracheal deviation.  Cardiovascular:     Rate and Rhythm: Normal rate and regular rhythm.     Heart sounds: Normal heart sounds. No murmur heard.    No friction rub. No gallop.  Pulmonary:     Effort: Pulmonary effort is normal. No respiratory distress.     Breath sounds: Normal breath sounds. No stridor. No wheezing or rales.  Chest:     Chest wall: No tenderness.  Abdominal:     General: Bowel sounds are normal. There is no distension.     Palpations: Abdomen is soft. There is no mass.     Tenderness: There is no abdominal tenderness. There is no guarding or rebound.     Hernia: No hernia is present.  Musculoskeletal:        General: No tenderness or deformity. Normal range of motion.     Cervical back: Normal range of motion and neck supple.  Lymphadenopathy:     Cervical: No cervical adenopathy.  Skin:    General: Skin is warm.     Coloration: Skin is not pale.     Findings: No erythema or rash.  Neurological:     Mental Status: He is alert and oriented to person, place, and time.     Cranial Nerves: No cranial nerve deficit.     Sensory: No sensory deficit.     Motor: No abnormal muscle tone.     Coordination: Coordination normal.     Deep Tendon Reflexes: Reflexes normal.  Psychiatric:        Behavior: Behavior normal.        Thought Content: Thought content normal.        Judgment: Judgment normal.           Assessment & Plan:  Benign essential HTN - Plan: BASIC METABOLIC PANEL WITH GFR Blood pressure slightly elevated.  We discussed increasing losartan to 100 mg a day.  Patient prefers to check his blood pressure for 1 week and see what is doing.  If he sees any crazy high numbers he will go ahead and increase the losartan as we discussed.  I will check his  renal function today.  Reassess in 1 week.  If his systolic blood pressures greater than 140 we will increase losartan to 100 mg

## 2022-09-23 LAB — BASIC METABOLIC PANEL WITH GFR
BUN/Creatinine Ratio: 17 (calc) (ref 6–22)
BUN: 24 mg/dL (ref 7–25)
CO2: 26 mmol/L (ref 20–32)
Calcium: 9.8 mg/dL (ref 8.6–10.3)
Chloride: 101 mmol/L (ref 98–110)
Creat: 1.42 mg/dL — ABNORMAL HIGH (ref 0.70–1.28)
Glucose, Bld: 139 mg/dL — ABNORMAL HIGH (ref 65–99)
Potassium: 4.1 mmol/L (ref 3.5–5.3)
Sodium: 136 mmol/L (ref 135–146)
eGFR: 53 mL/min/{1.73_m2} — ABNORMAL LOW (ref >=60)

## 2022-09-27 ENCOUNTER — Other Ambulatory Visit: Payer: Self-pay | Admitting: Family Medicine

## 2022-10-09 ENCOUNTER — Ambulatory Visit (INDEPENDENT_AMBULATORY_CARE_PROVIDER_SITE_OTHER): Payer: Medicare Other | Admitting: Family Medicine

## 2022-10-09 ENCOUNTER — Encounter: Payer: Self-pay | Admitting: Family Medicine

## 2022-10-09 VITALS — BP 136/80 | HR 70 | Temp 97.8°F | Ht 74.0 in | Wt 282.0 lb

## 2022-10-09 DIAGNOSIS — I10 Essential (primary) hypertension: Secondary | ICD-10-CM | POA: Diagnosis not present

## 2022-10-09 NOTE — Progress Notes (Signed)
Subjective:    Patient ID: Curtis Hunt, male    DOB: 04-11-1951, 72 y.o.   MRN: [id number]  Hypertension  09/22/22 Patient is a very pleasant 72 year old African-American gentleman who presents today with an elevated blood pressure.  He was feeling fine this morning but he checked his blood pressure and found it to be greater than 180 systolic.  He thinks it was read dyspnea.  He has been under more stress with his grandchildren.  He also admits that he has been eating more sodium in the form of soy sauce.  He has been trying to exercise and walk every day.  Here today I checked his blood pressure and found it to be 152/70.  By nurse found it to be 144 systolic.  At that time, my plan was: Blood pressure slightly elevated.  We discussed increasing losartan to 100 mg a day.  Patient prefers to check his blood pressure for 1 week and see what is doing.  If he sees any crazy high numbers he will go ahead and increase the losartan as we discussed.  I will check his renal function today.  Reassess in 1 week.  If his systolic blood pressures greater than 140 we will increase losartan to 100 mg  10/09/22 At the last visit, we increased to 100 mg a day.  He has been taking 100 mg a day since that time.  His blood pressure today is well-controlled.  He has not been checking his blood pressure at home because he does not trust his blood pressure cuff.  Has been trying to drink more water.  When he was here last time we did check a BMP.  His creatinine had risen from 1.19-1.42 indicating a drop in his GFR from over 60-53.  Therefore I asked him to try to drink more water and recheck a BMP today.  He is here to do that.  He is also avoiding all NSAIDs.  He is on continuous blood glucose monitoring.  His average blood sugar over the last 90 days 248.  His calculated A1c is 6.7.  He is in range 74% of the time.  Past Medical History:  Diagnosis Date   Arthritis    Cancer (HCC) 2004   Prostate CA with surgery    Diabetes mellitus without complication (HCC)    Gout    Hyperlipidemia    Prostate cancer [facility])    Past Surgical History:  Procedure Laterality Date   JOINT REPLACEMENT Right 12/18/2019   total hip replacement w/ Dr. Roda Shutters   PROSTATE SURGERY     TOTAL HIP ARTHROPLASTY Right 12/18/2019   Procedure: RIGHT TOTAL HIP ARTHROPLASTY ANTERIOR APPROACH;  Surgeon: Tarry Kos, MD;  Location: [facility] OR;  Service: Orthopedics;  Laterality: Right;   TOTAL HIP ARTHROPLASTY Left 09/30/2020   Procedure: LEFT TOTAL HIP ARTHROPLASTY ANTERIOR APPROACH;  Surgeon: Tarry Kos, MD;  Location: [facility];  Service: Orthopedics;  Laterality: Left;   Current Outpatient Medications on File Prior to Visit  Medication Sig Dispense Refill   aspirin EC 81 MG tablet Take 1 tablet (81 mg total) by mouth 2 (two) times daily. To be taken after surgery 84 tablet 0   B-D UF III MINI PEN NEEDLES 31G X 5 MM MISC USE AS DIRECTED TWICE A DAY 100 each 3   Blood Glucose Monitoring Suppl (ONE TOUCH ULTRA 2) w/Device KIT ONE TOUCH ULTRA 2 GLUCOSE SYSTEM W/ DEVICE KIT 1 kit 0   Continuous Blood Gluc  Receiver (DEXCOM G7 RECEIVER) DEVI 1 Device by Does not apply route as directed. 1 each 0   Continuous Blood Gluc Sensor (DEXCOM G7 SENSOR) MISC 1 Device by Does not apply route as directed. 3 each 3   Dulaglutide (TRULICITY) 3 MG/0.5ML SOPN Inject 3 mg as directed once a week. 2 mL 4   glucose blood (ONETOUCH ULTRA) test strip CHECK SUGAR 4 TO 5 TIMES DAILY EVERY MORNING, AFTER MEALS, AND AT BEDTIME E11.65 100 strip 11   insulin detemir (LEVEMIR FLEXPEN) 100 UNIT/ML FlexPen INJECT 22 UNITS DAILY AT 10PM 15 mL 11   losartan (COZAAR) 50 MG tablet TAKE 1 TABLET BY MOUTH EVERY DAY 90 tablet 3   rosuvastatin (CRESTOR) 40 MG tablet TAKE 1 TABLET BY MOUTH EVERY DAY 90 tablet 3   sildenafil (VIAGRA) 100 MG tablet TAKE 0.5-1 TABLETS BY MOUTH DAILY AS NEEDED FOR ERECTILE DYSFUNCTION. 4 tablet 14   traZODone (DESYREL) 50 MG tablet Take 1 tablet (50 mg  total) by mouth at bedtime as needed for sleep. 30 tablet 2   No current facility-administered medications on file prior to visit.   No Known Allergies Social History   Socioeconomic History   Marital status: Married    Spouse name: Not on file   Number of children: 3   Years of education: Not on file   Highest education level: Not on file  Occupational History   Not on file  Tobacco Use   Smoking status: Never   Smokeless tobacco: Never  Vaping Use   Vaping Use: Never used  Substance and Sexual Activity   Alcohol use: Yes    Comment: Beer prior to diagnosis of diabetes, about once a week during sports on tv   Drug use: No   Sexual activity: Not Currently  Other Topics Concern   Not on file  Social History Narrative   5 grandchildren and 2 great grandchildren.   1st wife died in 2015-10-30, married x 45 years.   Remarried 2nd wife 12/2020.    Social Determinants of Health   Financial Resource Strain: Low Risk  (06/25/2022)   Overall Financial Resource Strain (CARDIA)    Difficulty of Paying Living Expenses: Not hard at all  Food Insecurity: No Food Insecurity (09/05/2022)   Hunger Vital Sign    Worried About Running Out of Food in the Last Year: Never true    Ran Out of Food in the Last Year: Never true  Transportation Needs: No Transportation Needs (09/05/2022)   PRAPARE - Administrator, Civil Service (Medical): No    Lack of Transportation (Non-Medical): No  Physical Activity: Insufficiently Active (06/25/2022)   Exercise Vital Sign    Days of Exercise per Week: 3 days    Minutes of Exercise per Session: 30 min  Stress: No Stress Concern Present (06/25/2022)   [employer] of Occupational Health - Occupational Stress Questionnaire    Feeling of Stress : Not at all  Social Connections: Socially Integrated (06/25/2022)   Social Connection and Isolation Panel [NHANES]    Frequency of Communication with Friends and Family: More than three times a week    Frequency  of Social Gatherings with Friends and Family: Three times a week    Attends Religious Services: More than 4 times per year    Active Member of Clubs or Organizations: Yes    Attends Banker Meetings: More than 4 times per year    Marital Status: Married  Catering manager Violence: Not  At Risk (09/05/2022)   Humiliation, Afraid, Rape, and Kick questionnaire    Fear of Current or Ex-Partner: No    Emotionally Abused: No    Physically Abused: No    Sexually Abused: No   Family History  Problem Relation Age of Onset   Cancer Brother        prostate   Cancer Mother        breast ca     Review of Systems  All other systems reviewed and are negative.      Objective:   Physical Exam Vitals reviewed.  Constitutional:      General: He is not in acute distress.    Appearance: He is well-developed. He is not diaphoretic.  HENT:     Head: Normocephalic and atraumatic.     Right Ear: External ear normal.     Left Ear: External ear normal.     Nose: Nose normal.     Mouth/Throat:     Pharynx: No oropharyngeal exudate.  Eyes:     General: No scleral icterus.       Right eye: No discharge.        Left eye: No discharge.     Conjunctiva/sclera: Conjunctivae normal.     Pupils: Pupils are equal, round, and reactive to light.  Neck:     Thyroid: No thyromegaly.     Vascular: No JVD.     Trachea: No tracheal deviation.  Cardiovascular:     Rate and Rhythm: Normal rate and regular rhythm.     Heart sounds: Normal heart sounds. No murmur heard.    No friction rub. No gallop.  Pulmonary:     Effort: Pulmonary effort is normal. No respiratory distress.     Breath sounds: Normal breath sounds. No stridor. No wheezing or rales.  Chest:     Chest wall: No tenderness.  Abdominal:     General: Bowel sounds are normal. There is no distension.     Palpations: Abdomen is soft. There is no mass.     Tenderness: There is no abdominal tenderness. There is no guarding or rebound.      Hernia: No hernia is present.  Musculoskeletal:        General: No tenderness or deformity. Normal range of motion.     Cervical back: Normal range of motion and neck supple.  Lymphadenopathy:     Cervical: No cervical adenopathy.  Skin:    General: Skin is warm.     Coloration: Skin is not pale.     Findings: No erythema or rash.  Neurological:     Mental Status: He is alert and oriented to person, place, and time.     Cranial Nerves: No cranial nerve deficit.     Sensory: No sensory deficit.     Motor: No abnormal muscle tone.     Coordination: Coordination normal.     Deep Tendon Reflexes: Reflexes normal.  Psychiatric:        Behavior: Behavior normal.        Thought Content: Thought content normal.        Judgment: Judgment normal.           Assessment & Plan:  Benign essential HTN - Plan: BASIC METABOLIC PANEL WITH GFR Overall extremely happy with his blood pressures and his reported blood sugars.  His glycemic control is acceptable.  Check a BMP today.  If we need to improve his glycemic control, we may consider switching Trulicity to [facility]  but overall I am very happy with an A1c of 6.7 at his age and I am also very happy with his time in range 74%

## 2022-10-10 LAB — BASIC METABOLIC PANEL WITH GFR
BUN: 24 mg/dL (ref 7–25)
CO2: 24 mmol/L (ref 20–32)
Calcium: 10.2 mg/dL (ref 8.6–10.3)
Chloride: 104 mmol/L (ref 98–110)
Creat: 1.23 mg/dL (ref 0.70–1.28)
Glucose, Bld: 126 mg/dL — ABNORMAL HIGH (ref 65–99)
Potassium: 4.4 mmol/L (ref 3.5–5.3)
Sodium: 139 mmol/L (ref 135–146)
eGFR: 62 mL/min/{1.73_m2} (ref >=60)

## 2022-10-12 ENCOUNTER — Other Ambulatory Visit: Payer: Self-pay | Admitting: Family Medicine

## 2022-10-12 DIAGNOSIS — E119 Type 2 diabetes mellitus without complications: Secondary | ICD-10-CM

## 2022-10-12 NOTE — Telephone Encounter (Signed)
Prescription Request  10/12/2022  LOV: 10/09/2022  What is the name of the medication or equipment? Continuous Blood Gluc Receiver (DEXCOM G7 RECEIVER) DEVI    Have you contacted your pharmacy to request a refill? Yes   Which pharmacy would you like this sent to?  [id number] [place], [place] - [address] [place] [address] Phone: [phone] Fax: [phone]    Patient notified that their request is being sent to the clinical staff for review and that they should receive a response within 2 business days.   Please advise pharmacist at [phone].

## 2022-10-13 MED ORDER — DEXCOM G7 SENSOR MISC: 1.0000 | 3 refills | Status: DC

## 2022-10-13 NOTE — Telephone Encounter (Signed)
Requested Prescriptions  Pending Prescriptions Disp Refills   Continuous Glucose Sensor (DEXCOM G7 SENSOR) MISC 3 each 3    Sig: 1 Device by Does not apply route as directed.     Endocrinology: Diabetes - Testing Supplies Failed - 10/12/2022 12:35 PM      Failed - Valid encounter within last 12 months    Recent Outpatient Visits           11 months ago Encounter for Medicare annual wellness exam   [facility] Donita Brooks, MD   1 year ago Controlled type 2 diabetes mellitus without complication, with long-term current use of insulin (HCC)   [facility] Tanya Nones, Priscille Heidelberg, MD   3 years ago Right hip pain   [facility] Tanya Nones, Priscille Heidelberg, MD   3 years ago Routine general medical examination at a health care facility   [facility] Pickard, Priscille Heidelberg, MD   4 years ago Controlled type 2 diabetes mellitus without complication, with long-term current use of insulin (HCC)   [facility] Pickard, Priscille Heidelberg, MD

## 2022-10-15 ENCOUNTER — Other Ambulatory Visit: Payer: Self-pay | Admitting: Family Medicine

## 2022-10-15 MED ORDER — TIRZEPATIDE 7.5 MG/0.5ML ~~LOC~~ SOAJ: 7.5000 mg | SUBCUTANEOUS | 1 refills | Status: DC

## 2022-10-16 ENCOUNTER — Other Ambulatory Visit: Payer: Self-pay

## 2022-10-16 DIAGNOSIS — E119 Type 2 diabetes mellitus without complications: Secondary | ICD-10-CM

## 2022-10-20 ENCOUNTER — Telehealth: Payer: Self-pay

## 2022-10-20 ENCOUNTER — Other Ambulatory Visit: Payer: Self-pay

## 2022-10-20 DIAGNOSIS — E119 Type 2 diabetes mellitus without complications: Secondary | ICD-10-CM

## 2022-10-20 MED ORDER — TRULICITY 3 MG/0.5ML ~~LOC~~ SOAJ: 3.0000 mg | SUBCUTANEOUS | 4 refills | Status: DC

## 2022-10-20 NOTE — Telephone Encounter (Signed)
Pt requests a refill of: Prescription Request  10/20/2022  LOV: 10/09/22  What is the name of the medication or equipment? Trulicity 1.5 MG/0.5 ML PEN  Have you contacted your pharmacy to request a refill? No   Which pharmacy would you like this sent to?  [id number] [place], [place] - [address] [place] [address] Phone: [phone] Fax: [phone]    Patient notified that their request is being sent to the clinical staff for review and that they should receive a response within 2 business days.   Please advise at [facility] [phone]

## 2022-10-26 ENCOUNTER — Encounter: Payer: Self-pay | Admitting: Family Medicine

## 2022-11-03 ENCOUNTER — Other Ambulatory Visit: Payer: Self-pay | Admitting: Family Medicine

## 2022-11-04 ENCOUNTER — Telehealth: Payer: Self-pay

## 2022-11-04 NOTE — Telephone Encounter (Signed)
No longer current dosing of this medication  Requested Prescriptions  Pending Prescriptions Disp Refills   TRULICITY 1.5 MG/0.5ML SOPN [Pharmacy Med Name: TRULICITY 1.5 MG/0.5 ML PEN]  3    Sig: INJECT 1.5 MG AS DIRECTED EVERY TUESDAY.     Endocrinology:  Diabetes - GLP-1 Receptor Agonists Failed - 11/03/2022 12:39 PM      Failed - HBA1C is between 0 and 7.9 and within 180 days    Hgb A1c MFr Bld  Date Value Ref Range Status  04/22/2022 7.5 (H) <5.7 % of total Hgb Final    Comment:    For someone without known diabetes, a hemoglobin A1c value of 6.5% or greater indicates that they may have  diabetes and this should be confirmed with a follow-up  test. . For someone with known diabetes, a value <7% indicates  that their diabetes is well controlled and a value  greater than or equal to 7% indicates suboptimal  control. A1c targets should be individualized based on  duration of diabetes, age, comorbid conditions, and  other considerations. . Currently, no consensus exists regarding use of hemoglobin A1c for diagnosis of diabetes for children. .          Failed - Valid encounter within last 6 months    Recent Outpatient Visits           1 year ago Encounter for Medicare annual wellness exam   [facility] Donita Brooks, MD   2 years ago Controlled type 2 diabetes mellitus without complication, with long-term current use of insulin (HCC)   [facility] Pickard, Priscille Heidelberg, MD   3 years ago Right hip pain   [facility] Tanya Nones, Priscille Heidelberg, MD   3 years ago Routine general medical examination at a health care facility   [facility] Pickard, Priscille Heidelberg, MD   4 years ago Controlled type 2 diabetes mellitus without complication, with long-term current use of insulin (HCC)   [facility] Pickard, Priscille Heidelberg, MD

## 2022-11-04 NOTE — Telephone Encounter (Signed)
Medication does not need a PA, it is on backorder per fax from pharmacy. Is there another medication he can use? Thank you!

## 2022-11-04 NOTE — Telephone Encounter (Signed)
My Chart communication with patient. Is there anything else he can do until medication is approved? Thank you.  Hi Curtis Hunt,   I am going to resubmit the PA again. I apologize, I am not sure why it didn't go through the first time. I checked to see if I have any samples but I do not. I will also let Dr. Tanya Nones know what is going on to see if we can do a different medication. Thank you!  [name] ===View-only below this line===   ----- Message -----      From:Curtis Hunt      Sent:11/04/2022 11:50 AM EDT        [id number] Medical Advice Request Message List   Subject:Refill of Trulicity  I have been out of Trulicity for over 4 days and my blood sugar is kinda creeping up, can Dr Tanya Nones recommend another solution

## 2022-11-05 ENCOUNTER — Other Ambulatory Visit: Payer: Self-pay

## 2022-11-05 DIAGNOSIS — E119 Type 2 diabetes mellitus without complications: Secondary | ICD-10-CM

## 2022-11-05 DIAGNOSIS — E785 Hyperlipidemia, unspecified: Secondary | ICD-10-CM

## 2022-11-05 MED ORDER — SEMAGLUTIDE (1 MG/DOSE) 4 MG/3ML ~~LOC~~ SOPN: 1.0000 mg | PEN_INJECTOR | SUBCUTANEOUS | 1 refills | Status: DC

## 2022-11-17 ENCOUNTER — Other Ambulatory Visit: Payer: Self-pay | Admitting: Family Medicine

## 2022-11-17 ENCOUNTER — Other Ambulatory Visit: Payer: Self-pay

## 2022-11-17 DIAGNOSIS — I10 Essential (primary) hypertension: Secondary | ICD-10-CM

## 2022-11-17 DIAGNOSIS — E119 Type 2 diabetes mellitus without complications: Secondary | ICD-10-CM

## 2022-11-17 MED ORDER — LOSARTAN POTASSIUM 100 MG PO TABS
100.0000 mg | ORAL_TABLET | Freq: Every day | ORAL | 1 refills | Status: DC
Start: 2022-11-17 — End: 2023-05-03

## 2022-12-15 ENCOUNTER — Other Ambulatory Visit: Payer: Self-pay | Admitting: Family Medicine

## 2022-12-31 ENCOUNTER — Other Ambulatory Visit: Payer: Self-pay | Admitting: Family Medicine

## 2023-01-01 NOTE — Telephone Encounter (Signed)
Last OV 10/09/22.  Requested Prescriptions  Pending Prescriptions Disp Refills   rosuvastatin (CRESTOR) 40 MG tablet [Pharmacy Med Name: ROSUVASTATIN CALCIUM 40 MG TAB] 90 tablet 0    Sig: TAKE 1 TABLET BY MOUTH EVERY DAY     Cardiovascular:  Antilipid - Statins 2 Failed - 12/31/2022  2:45 AM      Failed - Valid encounter within last 12 months    Recent Outpatient Visits           1 year ago Encounter for Medicare annual wellness exam   [facility] Donita Brooks, MD   2 years ago Controlled type 2 diabetes mellitus without complication, with long-term current use of insulin (HCC)   [facility] Pickard, Priscille Heidelberg, MD   3 years ago Right hip pain   [facility] Pickard, Priscille Heidelberg, MD   3 years ago Routine general medical examination at a health care facility   [facility] Pickard, Priscille Heidelberg, MD   4 years ago Controlled type 2 diabetes mellitus without complication, with long-term current use of insulin (HCC)   [facility] Pickard, Priscille Heidelberg, MD              Failed - Lipid Panel in normal range within the last 12 months    Cholesterol  Date Value Ref Range Status  04/22/2022 147 <200 mg/dL Final   LDL Cholesterol (Calc)  Date Value Ref Range Status  04/22/2022 77 mg/dL (calc) Final    Comment:    Reference range: <100 . Desirable range <100 mg/dL for primary prevention;   <70 mg/dL for patients with CHD or diabetic patients  with > or = 2 CHD risk factors. Marland Kitchen LDL-C is now calculated using the Martin-Hopkins  calculation, which is a validated novel method providing  better accuracy than the Friedewald equation in the  estimation of LDL-C.  Horald Pollen et al. Lenox Ahr. 7846;962(95): 2061-2068  (http://education.[facility].com/faq/FAQ164)    HDL  Date Value Ref Range Status  04/22/2022 48 > OR = 40 mg/dL Final   Triglycerides  Date Value Ref Range Status  04/22/2022 134 <150 mg/dL Final          Passed - Cr in normal range and within 360 days    Creat  Date Value Ref Range Status  10/09/2022 1.23 0.70 - 1.28 mg/dL Final   Creatinine, Urine  Date Value Ref Range Status  04/22/2022 131 20 - 320 mg/dL Final         Passed - Patient is not pregnant

## 2023-02-14 ENCOUNTER — Other Ambulatory Visit: Payer: Self-pay | Admitting: Family Medicine

## 2023-02-15 NOTE — Telephone Encounter (Signed)
Prescription Request  02/15/2023  LOV: 10/09/2022  What is the name of the medication or equipment? Continuous Glucose Sensor (DEXCOM G7 SENSOR) MISC [[id number]]  Have you contacted your pharmacy to request a refill? Yes   Which pharmacy would you like this sent to?  [id number] [place], [place] - [address] [place] [address] Phone: [phone] Fax: [phone]    Patient notified that their request is being sent to the clinical staff for review and that they should receive a response within 2 business days.   Please advise at [facility] [phone]

## 2023-02-16 NOTE — Telephone Encounter (Signed)
Requested Prescriptions  Pending Prescriptions Disp Refills   Insulin Pen Needle (B-D ULTRAFINE III SHORT PEN) 31G X 8 MM MISC [Pharmacy Med Name: BD UF SHORT PEN NEEDLE 8MMX31G] 100 each 1    Sig: USE AS DIRECTED TWICE A DAY     Endocrinology: Diabetes - Testing Supplies Failed - 02/15/2023  9:55 AM      Failed - Valid encounter within last 12 months    Recent Outpatient Visits           1 year ago Encounter for Medicare annual wellness exam   [facility] Donita Brooks, MD   2 years ago Controlled type 2 diabetes mellitus without complication, with long-term current use of insulin (HCC)   [facility] Pickard, Priscille Heidelberg, MD   3 years ago Right hip pain   [facility] Tanya Nones, Priscille Heidelberg, MD   4 years ago Routine general medical examination at a health care facility   [facility] Pickard, Priscille Heidelberg, MD   4 years ago Controlled type 2 diabetes mellitus without complication, with long-term current use of insulin (HCC)   [facility] Pickard, Priscille Heidelberg, MD

## 2023-02-18 ENCOUNTER — Telehealth: Payer: Self-pay | Admitting: Family Medicine

## 2023-02-18 ENCOUNTER — Other Ambulatory Visit: Payer: Self-pay

## 2023-02-18 DIAGNOSIS — E119 Type 2 diabetes mellitus without complications: Secondary | ICD-10-CM

## 2023-02-18 MED ORDER — BD PEN NEEDLE SHORT U/F 31G X 8 MM MISC
1 refills | Status: DC
Start: 2023-02-18 — End: 2024-03-21

## 2023-02-18 MED ORDER — DEXCOM G7 SENSOR MISC
1.0000 | 3 refills | Status: DC
Start: 2023-02-18 — End: 2023-06-10

## 2023-02-18 NOTE — Telephone Encounter (Signed)
Received call from [place] with [employer] to advise the provider the company is no longer making insulin detemir (LEVEMIR FLEXPEN) 100 UNIT/ML FlexPen   Lanibelle called to give formulary alternatives for future refills which are as follows:  Lantus Lafonda Mosses  The aforementioned all come as pens and are all covered under patient's insurance.  Please advise with any questions on her direct line at [phone], ext [phone]; voicemail is secured. Ok to leave a detailed message.

## 2023-02-18 NOTE — Telephone Encounter (Signed)
*  Patient called to follow up on refills requested for the following:   Continuous Blood Gluc Receiver (DEXCOM G7 RECEIVER) DEVI   Insulin Pen Needle (B-D ULTRAFINE III SHORT PEN) 31G X 8 MM MISC [[id number]]  **LOV 10/09/2022**  Pharmacy confirmed as:  [id number] [place], [place] - [address] [place] [address] Phone: [phone]  Fax: [phone] DEA #: [id number]   Please advise at [phone].

## 2023-02-19 ENCOUNTER — Other Ambulatory Visit: Payer: Self-pay

## 2023-02-19 DIAGNOSIS — E119 Type 2 diabetes mellitus without complications: Secondary | ICD-10-CM

## 2023-02-19 MED ORDER — LANTUS SOLOSTAR 100 UNIT/ML ~~LOC~~ SOPN
22.0000 [IU] | PEN_INJECTOR | Freq: Every day | SUBCUTANEOUS | 99 refills | Status: DC
Start: 2023-02-19 — End: 2023-07-06

## 2023-02-19 NOTE — Telephone Encounter (Signed)
Erroneous encounter. Please disregard.

## 2023-03-29 ENCOUNTER — Other Ambulatory Visit: Payer: Medicare Other

## 2023-03-29 VITALS — BP 132/76 | Ht 74.0 in | Wt 282.0 lb

## 2023-03-29 DIAGNOSIS — I10 Essential (primary) hypertension: Secondary | ICD-10-CM | POA: Diagnosis not present

## 2023-03-29 DIAGNOSIS — E119 Type 2 diabetes mellitus without complications: Secondary | ICD-10-CM | POA: Diagnosis not present

## 2023-03-29 DIAGNOSIS — Z794 Long term (current) use of insulin: Secondary | ICD-10-CM

## 2023-03-29 DIAGNOSIS — D649 Anemia, unspecified: Secondary | ICD-10-CM

## 2023-03-29 DIAGNOSIS — E1110 Type 2 diabetes mellitus with ketoacidosis without coma: Secondary | ICD-10-CM

## 2023-03-29 DIAGNOSIS — E785 Hyperlipidemia, unspecified: Secondary | ICD-10-CM | POA: Diagnosis not present

## 2023-03-30 LAB — COMPLETE METABOLIC PANEL WITH GFR
AG Ratio: 1.3 (calc) (ref 1.0–2.5)
ALT: 11 U/L (ref 9–46)
AST: 14 U/L (ref 10–35)
Albumin: 4.1 g/dL (ref 3.6–5.1)
Alkaline phosphatase (APISO): 62 U/L (ref 35–144)
BUN/Creatinine Ratio: 17 (calc) (ref 6–22)
BUN: 22 mg/dL (ref 7–25)
CO2: 28 mmol/L (ref 20–32)
Calcium: 9.6 mg/dL (ref 8.6–10.3)
Chloride: 104 mmol/L (ref 98–110)
Creat: 1.29 mg/dL — ABNORMAL HIGH (ref 0.70–1.28)
Globulin: 3.2 g/dL (ref 1.9–3.7)
Glucose, Bld: 106 mg/dL — ABNORMAL HIGH (ref 65–99)
Potassium: 4.3 mmol/L (ref 3.5–5.3)
Sodium: 140 mmol/L (ref 135–146)
Total Bilirubin: 0.3 mg/dL (ref 0.2–1.2)
Total Protein: 7.3 g/dL (ref 6.1–8.1)
eGFR: 59 mL/min/{1.73_m2} — ABNORMAL LOW (ref >=60)

## 2023-03-30 LAB — CBC WITH DIFFERENTIAL/PLATELET
Absolute Lymphocytes: 2227 {cells}/uL (ref 850–3900)
Absolute Monocytes: 442 {cells}/uL (ref 200–950)
Basophils Absolute: 102 {cells}/uL (ref 0–200)
Basophils Relative: 1.6 %
Eosinophils Absolute: 198 {cells}/uL (ref 15–500)
Eosinophils Relative: 3.1 %
HCT: 36.4 % — ABNORMAL LOW (ref 38.5–50.0)
Hemoglobin: 11.8 g/dL — ABNORMAL LOW (ref 13.2–17.1)
MCH: 28.2 pg (ref 27.0–33.0)
MCHC: 32.4 g/dL (ref 32.0–36.0)
MCV: 87.1 fL (ref 80.0–100.0)
MPV: 10.6 fL (ref 7.5–12.5)
Monocytes Relative: 6.9 %
Neutro Abs: 3430 {cells}/uL (ref 1500–7800)
Neutrophils Relative %: 53.6 %
Platelets: 268 10*3/uL (ref 140–400)
RBC: 4.18 10*6/uL — ABNORMAL LOW (ref 4.20–5.80)
RDW: 14.4 % (ref 11.0–15.0)
Total Lymphocyte: 34.8 %
WBC: 6.4 10*3/uL (ref 3.8–10.8)

## 2023-03-30 LAB — MICROALBUMIN / CREATININE URINE RATIO
Creatinine, Urine: 125 mg/dL (ref 20–320)
Microalb Creat Ratio: 26 mg/g{creat} (ref <30)
Microalb, Ur: 3.2 mg/dL

## 2023-03-30 LAB — LIPID PANEL
Cholesterol: 137 mg/dL (ref <200)
HDL: 46 mg/dL (ref >=40)
LDL Cholesterol (Calc): 68 mg/dL
Non-HDL Cholesterol (Calc): 91 mg/dL (ref <130)
Total CHOL/HDL Ratio: 3 (calc) (ref <5.0)
Triglycerides: 155 mg/dL — ABNORMAL HIGH (ref <150)

## 2023-03-30 LAB — HEMOGLOBIN A1C
Hgb A1c MFr Bld: 7.7 %{Hb} — ABNORMAL HIGH (ref <5.7)
Mean Plasma Glucose: 174 mg/dL
eAG (mmol/L): 9.7 mmol/L

## 2023-04-05 ENCOUNTER — Ambulatory Visit: Payer: Medicare Other | Admitting: Family Medicine

## 2023-04-05 ENCOUNTER — Encounter: Payer: Self-pay | Admitting: Family Medicine

## 2023-04-05 VITALS — BP 130/88 | HR 80 | Temp 98.1°F | Ht 74.0 in | Wt 275.5 lb

## 2023-04-05 DIAGNOSIS — I10 Essential (primary) hypertension: Secondary | ICD-10-CM

## 2023-04-05 DIAGNOSIS — E119 Type 2 diabetes mellitus without complications: Secondary | ICD-10-CM

## 2023-04-05 DIAGNOSIS — E785 Hyperlipidemia, unspecified: Secondary | ICD-10-CM

## 2023-04-05 DIAGNOSIS — E1159 Type 2 diabetes mellitus with other circulatory complications: Secondary | ICD-10-CM | POA: Diagnosis not present

## 2023-04-05 DIAGNOSIS — Z794 Long term (current) use of insulin: Secondary | ICD-10-CM

## 2023-04-05 MED ORDER — SEMAGLUTIDE (2 MG/DOSE) 8 MG/3ML ~~LOC~~ SOPN: 2.0000 mg | PEN_INJECTOR | SUBCUTANEOUS | 11 refills | Status: DC

## 2023-04-05 NOTE — Progress Notes (Signed)
Subjective:    Patient ID: Curtis Hunt, male    DOB: 15-Feb-1951, 72 y.o.   MRN: [id number] Patient is to follow-up his diabetes.  He is currently on Ozempic 1 mg subcu weekly along with Lantus 22 units a day.  He is using continuous glucose monitoring.  His meter reports that he is in range 89% of the time.  His calculated A1c is 6.6.  However his venous A1c was 7.7.  Therefore I believe that his glucose monitoring is slightly off.  He denies any hypoglycemic episodes.  He denies any chest pain shortness of breath or dyspnea on exertion.  He denies any neuropathy in his feet.  He sees his eye doctor every March.  Past Medical History:  Diagnosis Date   Arthritis    Cancer (HCC) 2004   Prostate CA with surgery   Diabetes mellitus without complication (HCC)    Gout    Hyperlipidemia    Prostate cancer [facility])    Past Surgical History:  Procedure Laterality Date   JOINT REPLACEMENT Right 12/18/2019   total hip replacement w/ Dr. Roda Shutters   PROSTATE SURGERY     TOTAL HIP ARTHROPLASTY Right 12/18/2019   Procedure: RIGHT TOTAL HIP ARTHROPLASTY ANTERIOR APPROACH;  Surgeon: Tarry Kos, MD;  Location: [facility] OR;  Service: Orthopedics;  Laterality: Right;   TOTAL HIP ARTHROPLASTY Left 09/30/2020   Procedure: LEFT TOTAL HIP ARTHROPLASTY ANTERIOR APPROACH;  Surgeon: Tarry Kos, MD;  Location: [facility] OR;  Service: Orthopedics;  Laterality: Left;   Current Outpatient Medications on File Prior to Visit  Medication Sig Dispense Refill   aspirin EC 81 MG tablet Take 1 tablet (81 mg total) by mouth 2 (two) times daily. To be taken after surgery 84 tablet 0   Continuous Blood Gluc Receiver (DEXCOM G7 RECEIVER) DEVI 1 Device by Does not apply route as directed. 1 each 0   Continuous Glucose Sensor (DEXCOM G7 SENSOR) MISC 1 Device by Does not apply route as directed. 3 each 3   insulin glargine (LANTUS SOLOSTAR) 100 UNIT/ML Solostar Pen Inject 22 Units into the skin daily. 15 mL PRN   Insulin Pen Needle (B-D  ULTRAFINE III SHORT PEN) 31G X 8 MM MISC USE AS DIRECTED TWICE A DAY 100 each 1   losartan (COZAAR) 100 MG tablet Take 1 tablet (100 mg total) by mouth daily. 90 tablet 1   rosuvastatin (CRESTOR) 40 MG tablet TAKE 1 TABLET BY MOUTH EVERY DAY 90 tablet 0   sildenafil (VIAGRA) 100 MG tablet TAKE 0.5-1 TABLETS BY MOUTH DAILY AS NEEDED FOR ERECTILE DYSFUNCTION. 4 tablet 14   traZODone (DESYREL) 50 MG tablet Take 1 tablet (50 mg total) by mouth at bedtime as needed for sleep. 30 tablet 2   Blood Glucose Monitoring Suppl (ONE TOUCH ULTRA 2) w/Device KIT ONE TOUCH ULTRA 2 GLUCOSE SYSTEM W/ DEVICE KIT (Patient not taking: Reported on 04/05/2023) 1 kit 0   glucose blood (ONETOUCH ULTRA) test strip CHECK SUGAR 4 TO 5 TIMES DAILY EVERY MORNING, AFTER MEALS, AND AT BEDTIME E11.65 (Patient not taking: Reported on 04/05/2023) 100 strip 11   No current facility-administered medications on file prior to visit.   No Known Allergies Social History   Socioeconomic History   Marital status: Married    Spouse name: Not on file   Number of children: 3   Years of education: Not on file   Highest education level: Not on file  Occupational History   Not on file  Tobacco Use   Smoking status: Never   Smokeless tobacco: Never  Vaping Use   Vaping status: Never Used  Substance and Sexual Activity   Alcohol use: Yes    Comment: Beer prior to diagnosis of diabetes, about once a week during sports on tv   Drug use: No   Sexual activity: Not Currently  Other Topics Concern   Not on file  Social History Narrative   5 grandchildren and 2 great grandchildren.   1st wife died in 2016-04-17, married x 45 years.   Remarried 2nd wife 12/2020.    Social Determinants of Health   Financial Resource Strain: Low Risk  (06/25/2022)   Overall Financial Resource Strain (CARDIA)    Difficulty of Paying Living Expenses: Not hard at all  Food Insecurity: No Food Insecurity (09/05/2022)   Hunger Vital Sign    Worried About Running  Out of Food in the Last Year: Never true    Ran Out of Food in the Last Year: Never true  Transportation Needs: No Transportation Needs (09/05/2022)   PRAPARE - Administrator, Civil Service (Medical): No    Lack of Transportation (Non-Medical): No  Physical Activity: Insufficiently Active (06/25/2022)   Exercise Vital Sign    Days of Exercise per Week: 3 days    Minutes of Exercise per Session: 30 min  Stress: No Stress Concern Present (06/25/2022)   [employer] of Occupational Health - Occupational Stress Questionnaire    Feeling of Stress : Not at all  Social Connections: Socially Integrated (06/25/2022)   Social Connection and Isolation Panel [NHANES]    Frequency of Communication with Friends and Family: More than three times a week    Frequency of Social Gatherings with Friends and Family: Three times a week    Attends Religious Services: More than 4 times per year    Active Member of Clubs or Organizations: Yes    Attends Banker Meetings: More than 4 times per year    Marital Status: Married  Catering manager Violence: Not At Risk (09/05/2022)   Humiliation, Afraid, Rape, and Kick questionnaire    Fear of Current or Ex-Partner: No    Emotionally Abused: No    Physically Abused: No    Sexually Abused: No   Family History  Problem Relation Age of Onset   Cancer Brother        prostate   Cancer Mother        breast ca     Review of Systems  All other systems reviewed and are negative.      Objective:   Physical Exam Vitals reviewed.  Constitutional:      General: He is not in acute distress.    Appearance: He is well-developed. He is not diaphoretic.  HENT:     Head: Normocephalic and atraumatic.     Right Ear: External ear normal.     Left Ear: External ear normal.     Nose: Nose normal.     Mouth/Throat:     Pharynx: No oropharyngeal exudate.  Eyes:     General: No scleral icterus.       Right eye: No discharge.        Left eye:  No discharge.     Conjunctiva/sclera: Conjunctivae normal.     Pupils: Pupils are equal, round, and reactive to light.  Neck:     Thyroid: No thyromegaly.     Vascular: No JVD.     Trachea: No  tracheal deviation.  Cardiovascular:     Rate and Rhythm: Normal rate and regular rhythm.     Heart sounds: Normal heart sounds. No murmur heard.    No friction rub. No gallop.  Pulmonary:     Effort: Pulmonary effort is normal. No respiratory distress.     Breath sounds: Normal breath sounds. No stridor. No wheezing or rales.  Chest:     Chest wall: No tenderness.  Abdominal:     General: Bowel sounds are normal. There is no distension.     Palpations: Abdomen is soft. There is no mass.     Tenderness: There is no abdominal tenderness. There is no guarding or rebound.     Hernia: No hernia is present.  Musculoskeletal:        General: No tenderness or deformity. Normal range of motion.     Cervical back: Normal range of motion and neck supple.  Lymphadenopathy:     Cervical: No cervical adenopathy.  Skin:    General: Skin is warm.     Coloration: Skin is not pale.     Findings: No erythema or rash.  Neurological:     Mental Status: He is alert and oriented to person, place, and time.     Cranial Nerves: No cranial nerve deficit.     Sensory: No sensory deficit.     Motor: No abnormal muscle tone.     Coordination: Coordination normal.     Deep Tendon Reflexes: Reflexes normal.  Psychiatric:        Behavior: Behavior normal.        Thought Content: Thought content normal.        Judgment: Judgment normal.           Assessment & Plan:  Controlled type 2 diabetes mellitus without complication, with long-term current use of insulin (HCC)  Benign essential HTN  Dyslipidemia Lab on 03/29/2023  Component Date Value Ref Range Status   WBC 03/29/2023 6.4  3.8 - 10.8 Thousand/uL Final   RBC 03/29/2023 4.18 (L)  4.20 - 5.80 Million/uL Final   Hemoglobin 03/29/2023 11.8 (L)  13.2  - 17.1 g/dL Final   HCT 62/13/0865 36.4 (L)  38.5 - 50.0 % Final   MCV 03/29/2023 87.1  80.0 - 100.0 fL Final   MCH 03/29/2023 28.2  27.0 - 33.0 pg Final   MCHC 03/29/2023 32.4  32.0 - 36.0 g/dL Final   Comment: For adults, a slight decrease in the calculated MCHC value (in the range of 30 to 32 g/dL) is most likely not clinically significant; however, it should be interpreted with caution in correlation with other red cell parameters and the patient's clinical condition.    RDW 03/29/2023 14.4  11.0 - 15.0 % Final   Platelets 03/29/2023 268  140 - 400 Thousand/uL Final   MPV 03/29/2023 10.6  7.5 - 12.5 fL Final   Neutro Abs 03/29/2023 3,430  1,500 - 7,800 cells/uL Final   Absolute Lymphocytes 03/29/2023 2,227  850 - 3,900 cells/uL Final   Absolute Monocytes 03/29/2023 442  200 - 950 cells/uL Final   Eosinophils Absolute 03/29/2023 198  15 - 500 cells/uL Final   Basophils Absolute 03/29/2023 102  0 - 200 cells/uL Final   Neutrophils Relative % 03/29/2023 53.6  % Final   Total Lymphocyte 03/29/2023 34.8  % Final   Monocytes Relative 03/29/2023 6.9  % Final   Eosinophils Relative 03/29/2023 3.1  % Final   Basophils Relative 03/29/2023 1.6  %  Final   Glucose, Bld 03/29/2023 106 (H)  65 - 99 mg/dL Final   Comment: .            Fasting reference interval . For someone without known diabetes, a glucose value between 100 and 125 mg/dL is consistent with prediabetes and should be confirmed with a follow-up test. .    BUN 03/29/2023 22  7 - 25 mg/dL Final   Creat 16/02/9603 1.29 (H)  0.70 - 1.28 mg/dL Final   eGFR 54/01/8118 59 (L)  > OR = 60 mL/min/1.99m2 Final   BUN/Creatinine Ratio 03/29/2023 17  6 - 22 (calc) Final   Sodium 03/29/2023 140  135 - 146 mmol/L Final   Potassium 03/29/2023 4.3  3.5 - 5.3 mmol/L Final   Chloride 03/29/2023 104  98 - 110 mmol/L Final   CO2 03/29/2023 28  20 - 32 mmol/L Final   Calcium 03/29/2023 9.6  8.6 - 10.3 mg/dL Final   Total Protein 14/78/2956  7.3  6.1 - 8.1 g/dL Final   Albumin 21/30/8657 4.1  3.6 - 5.1 g/dL Final   Globulin 84/69/6295 3.2  1.9 - 3.7 g/dL (calc) Final   AG Ratio 03/29/2023 1.3  1.0 - 2.5 (calc) Final   Total Bilirubin 03/29/2023 0.3  0.2 - 1.2 mg/dL Final   Alkaline phosphatase (APISO) 03/29/2023 62  35 - 144 U/L Final   AST 03/29/2023 14  10 - 35 U/L Final   ALT 03/29/2023 11  9 - 46 U/L Final   Hgb A1c MFr Bld 03/29/2023 7.7 (H)  <5.7 % of total Hgb Final   Comment: For someone without known diabetes, a hemoglobin A1c value of 6.5% or greater indicates that they may have  diabetes and this should be confirmed with a follow-up  test. . For someone with known diabetes, a value <7% indicates  that their diabetes is well controlled and a value  greater than or equal to 7% indicates suboptimal  control. A1c targets should be individualized based on  duration of diabetes, age, comorbid conditions, and  other considerations. . Currently, no consensus exists regarding use of hemoglobin A1c for diagnosis of diabetes for children. .    Mean Plasma Glucose 03/29/2023 174  mg/dL Final   eAG (mmol/L) 28/41/3244 9.7  mmol/L Final   Cholesterol 03/29/2023 137  <200 mg/dL Final   HDL 05/27/7251 46  > OR = 40 mg/dL Final   Triglycerides 66/44/0347 155 (H)  <150 mg/dL Final   LDL Cholesterol (Calc) 03/29/2023 68  mg/dL (calc) Final   Comment: Reference range: <100 . Desirable range <100 mg/dL for primary prevention;   <70 mg/dL for patients with CHD or diabetic patients  with > or = 2 CHD risk factors. Marland Kitchen LDL-C is now calculated using the Martin-Hopkins  calculation, which is a validated novel method providing  better accuracy than the Friedewald equation in the  estimation of LDL-C.  Horald Pollen et al. Lenox Ahr. 4259;563(87): 2061-2068  (http://education.[facility].com/faq/FAQ164)    Total CHOL/HDL Ratio 03/29/2023 3.0  <5.6 (calc) Final   Non-HDL Cholesterol (Calc) 03/29/2023 91  <130 mg/dL (calc) Final    Comment: For patients with diabetes plus 1 major ASCVD risk  factor, treating to a non-HDL-C goal of <100 mg/dL  (LDL-C of <43 mg/dL) is considered a therapeutic  option.    Creatinine, Urine 03/29/2023 125  20 - 320 mg/dL Final   Microalb, Ur 32/95/1884 3.2  mg/dL Final   Comment: Reference Range Not established    Microalb  Creat Ratio 03/29/2023 26  <30 mg/g creat Final   Comment: . The ADA defines abnormalities in albumin excretion as follows: Marland Kitchen Albuminuria Category        Result (mg/g creatinine) . Normal to Mildly increased   <30 Moderately increased         30-299  Severely increased           > OR = 300 . The ADA recommends that at least two of three specimens collected within a 3-6 month period be abnormal before considering a patient to be within a diagnostic category.    We decided to increase Ozempic to 2 mg subcu weekly.  Continue insulin at 22 units a day.  Blood pressure and cholesterol are outstanding.  Diabetic foot exam was performed today and was normal.  Diabetic eye exam is up-to-date.  Immunizations are up-to-date.  Reassess in 3 months or sooner if worsening

## 2023-04-20 ENCOUNTER — Telehealth: Payer: Self-pay

## 2023-04-20 ENCOUNTER — Other Ambulatory Visit: Payer: Self-pay

## 2023-04-20 DIAGNOSIS — E119 Type 2 diabetes mellitus without complications: Secondary | ICD-10-CM

## 2023-04-20 MED ORDER — SEMAGLUTIDE (2 MG/DOSE) 8 MG/3ML ~~LOC~~ SOPN: 2.0000 mg | PEN_INJECTOR | SUBCUTANEOUS | 3 refills | Status: DC

## 2023-04-20 NOTE — Telephone Encounter (Signed)
Prescription Request  04/20/2023  LOV: 04/05/23  What is the name of the medication or equipment? Semaglutide, 2 MG/DOSE, 8 MG/3ML SOPN [[id number]]  Have you contacted your pharmacy to request a refill? Yes   Which pharmacy would you like this sent to?  [id number] [place], [place] - [address] [place] [address] Phone: [phone] Fax: [phone]    Patient notified that their request is being sent to the clinical staff for review and that they should receive a response within 2 business days.   Please advise at [facility] [phone]

## 2023-04-30 ENCOUNTER — Other Ambulatory Visit: Payer: Self-pay | Admitting: Family Medicine

## 2023-04-30 DIAGNOSIS — E119 Type 2 diabetes mellitus without complications: Secondary | ICD-10-CM

## 2023-04-30 DIAGNOSIS — I10 Essential (primary) hypertension: Secondary | ICD-10-CM

## 2023-05-03 NOTE — Telephone Encounter (Signed)
Prescription Request  05/03/2023  LOV: 04/05/2023  What is the name of the medication or equipment? rosuvastatin (CRESTOR) 40 MG tablet [[id number]]   Have you contacted your pharmacy to request a refill? Yes   Which pharmacy would you like this sent to?  [id number] [place], [place] - [address] [place] [address] Phone: [phone] Fax: [phone]    Patient notified that their request is being sent to the clinical staff for review and that they should receive a response within 2 business days.   Please advise at [facility] [phone]

## 2023-05-03 NOTE — Telephone Encounter (Signed)
Requested Prescriptions  Pending Prescriptions Disp Refills   losartan (COZAAR) 100 MG tablet [Pharmacy Med Name: LOSARTAN POTASSIUM 100 MG TAB] 90 tablet 1    Sig: TAKE 1 TABLET BY MOUTH EVERY DAY     Cardiovascular:  Angiotensin Receptor Blockers Failed - 05/03/2023 11:13 AM      Failed - Cr in normal range and within 180 days    Creat  Date Value Ref Range Status  03/29/2023 1.29 (H) 0.70 - 1.28 mg/dL Final   Creatinine, Urine  Date Value Ref Range Status  03/29/2023 125 20 - 320 mg/dL Final         Failed - Valid encounter within last 6 months    Recent Outpatient Visits           1 year ago Encounter for Medicare annual wellness exam   [facility] Donita Brooks, MD   2 years ago Controlled type 2 diabetes mellitus without complication, with long-term current use of insulin (HCC)   [facility] Pickard, Priscille Heidelberg, MD   3 years ago Right hip pain   [facility] Tanya Nones, Priscille Heidelberg, MD   4 years ago Routine general medical examination at a health care facility   [facility] Pickard, Priscille Heidelberg, MD   5 years ago Controlled type 2 diabetes mellitus without complication, with long-term current use of insulin (HCC)   [facility] Pickard, Priscille Heidelberg, MD              Passed - K in normal range and within 180 days    Potassium  Date Value Ref Range Status  03/29/2023 4.3 3.5 - 5.3 mmol/L Final         Passed - Patient is not pregnant      Passed - Last BP in normal range    BP Readings from Last 1 Encounters:  04/05/23 130/88

## 2023-05-12 ENCOUNTER — Telehealth: Payer: Self-pay

## 2023-05-12 NOTE — Telephone Encounter (Signed)
Fax received from [employer]. Levemir INJ Flexpen is no longe ron the plans 2025 Drug list. Alternatives are: Lantus Toujeo Tresiba. Thank you.

## 2023-05-13 ENCOUNTER — Other Ambulatory Visit: Payer: Self-pay

## 2023-05-17 ENCOUNTER — Other Ambulatory Visit: Payer: Self-pay | Admitting: Family Medicine

## 2023-05-28 ENCOUNTER — Encounter: Payer: Self-pay | Admitting: Family Medicine

## 2023-06-10 ENCOUNTER — Other Ambulatory Visit: Payer: Self-pay | Admitting: Family Medicine

## 2023-06-10 DIAGNOSIS — E119 Type 2 diabetes mellitus without complications: Secondary | ICD-10-CM

## 2023-06-11 DIAGNOSIS — R825 Elevated urine levels of drugs, medicaments and biological substances: Secondary | ICD-10-CM | POA: Diagnosis not present

## 2023-07-01 ENCOUNTER — Ambulatory Visit: Payer: Medicare Other | Admitting: *Deleted

## 2023-07-01 DIAGNOSIS — Z0000 Encounter for general adult medical examination without abnormal findings: Secondary | ICD-10-CM

## 2023-07-01 NOTE — Progress Notes (Signed)
 Subjective:   Curtis Hunt is a 73 y.o. male who presents for Medicare Annual/Subsequent preventive examination.  Visit Complete: Virtual I connected with  Curtis Hunt on 07/01/23 by a audio enabled telemedicine application and verified that I am speaking with the correct person using two identifiers.  Patient Location: Home  Provider Location: Home Office  I discussed the limitations of evaluation and management by telemedicine. The patient expressed understanding and agreed to proceed.  Vital Signs: Because this visit was a virtual/telehealth visit, some criteria may be missing or patient reported. Any vitals not documented were not able to be obtained and vitals that have been documented are patient reported.    Cardiac Risk Factors include: advanced age (>70men, >41 women);diabetes mellitus;male gender     Objective:    Today's Vitals   07/01/23 0824  PainSc: 0-No pain   There is no height or weight on file to calculate BMI.     06/25/2022    8:47 AM 06/19/2021    8:35 AM 09/30/2020    3:50 PM 09/26/2020   10:14 AM 12/18/2019    5:45 PM 12/14/2019    9:56 AM 09/03/2015   11:52 AM  Advanced Directives  Does Patient Have a Medical Advance Directive? No No No No No No No  Would patient like information on creating a medical advance directive? Yes (MAU/Ambulatory/Procedural Areas - Information given) No - Patient declined No - Patient declined No - Patient declined No - Patient declined  No - patient declined information    Current Medications (verified) Outpatient Encounter Medications as of 07/01/2023  Medication Sig   aspirin  EC 81 MG tablet Take 1 tablet (81 mg total) by mouth 2 (two) times daily. To be taken after surgery   Continuous Blood Gluc Receiver (DEXCOM G7 RECEIVER) DEVI 1 Device by Does not apply route as directed.   Continuous Glucose Sensor (DEXCOM G7 SENSOR) MISC 1 DEVICE BY DOES NOT APPLY ROUTE AS DIRECTED.   insulin  glargine (LANTUS  SOLOSTAR) 100  UNIT/ML Solostar Pen Inject 22 Units into the skin daily.   Insulin  Pen Needle (B-D ULTRAFINE III SHORT PEN) 31G X 8 MM MISC USE AS DIRECTED TWICE A DAY   losartan  (COZAAR ) 100 MG tablet TAKE 1 TABLET BY MOUTH EVERY DAY   rosuvastatin  (CRESTOR ) 40 MG tablet TAKE 1 TABLET BY MOUTH EVERY DAY   Semaglutide , 2 MG/DOSE, 8 MG/3ML SOPN Inject 2 mg as directed once a week.   sildenafil  (VIAGRA ) 100 MG tablet TAKE 1/2 - 1 TABLET BY MOUTH DAILY AS NEEDED FOR ERECTILE DYSFUNCTION   traZODone  (DESYREL ) 50 MG tablet Take 1 tablet (50 mg total) by mouth at bedtime as needed for sleep.   Blood Glucose Monitoring Suppl (ONE TOUCH ULTRA 2) w/Device KIT ONE TOUCH ULTRA 2 GLUCOSE SYSTEM W/ DEVICE KIT (Patient not taking: Reported on 04/05/2023)   glucose blood (ONETOUCH ULTRA) test strip CHECK SUGAR 4 TO 5 TIMES DAILY EVERY MORNING, AFTER MEALS, AND AT BEDTIME E11.65 (Patient not taking: Reported on 07/01/2023)   No facility-administered encounter medications on file as of 07/01/2023.    Allergies (verified) Patient has no known allergies.   History: Past Medical History:  Diagnosis Date   Arthritis    Cancer (HCC) 2004   Prostate CA with surgery   Diabetes mellitus without complication (HCC)    Gout    Hyperlipidemia    Prostate cancer [facility])    Past Surgical History:  Procedure Laterality Date   JOINT REPLACEMENT Right 12/18/2019  total hip replacement w/ Dr. Jerri   PROSTATE SURGERY     TOTAL HIP ARTHROPLASTY Right 12/18/2019   Procedure: RIGHT TOTAL HIP ARTHROPLASTY ANTERIOR APPROACH;  Surgeon: Jerri Kay HERO, MD;  Location: [facility] OR;  Service: Orthopedics;  Laterality: Right;   TOTAL HIP ARTHROPLASTY Left 09/30/2020   Procedure: LEFT TOTAL HIP ARTHROPLASTY ANTERIOR APPROACH;  Surgeon: Jerri Kay HERO, MD;  Location: [facility] OR;  Service: Orthopedics;  Laterality: Left;   Family History  Problem Relation Age of Onset   Cancer Brother        prostate   Cancer Mother        breast ca   Social History    Socioeconomic History   Marital status: Married    Spouse name: Not on file   Number of children: 3   Years of education: Not on file   Highest education level: Not on file  Occupational History   Not on file  Tobacco Use   Smoking status: Never   Smokeless tobacco: Never  Vaping Use   Vaping status: Never Used  Substance and Sexual Activity   Alcohol use: Yes    Comment: Beer prior to diagnosis of diabetes, about once a week during sports on tv   Drug use: No   Sexual activity: Not Currently  Other Topics Concern   Not on file  Social History Narrative   5 grandchildren and 2 great grandchildren.   1st wife died in 05-17-16, married x 45 years.   Remarried 2nd wife 12/2020.    Social Drivers of Corporate Investment Banker Strain: Low Risk  (07/01/2023)   Overall Financial Resource Strain (CARDIA)    Difficulty of Paying Living Expenses: Not hard at all  Food Insecurity: No Food Insecurity (07/01/2023)   Hunger Vital Sign    Worried About Running Out of Food in the Last Year: Never true    Ran Out of Food in the Last Year: Never true  Transportation Needs: No Transportation Needs (07/01/2023)   PRAPARE - Administrator, Civil Service (Medical): No    Lack of Transportation (Non-Medical): No  Physical Activity: Insufficiently Active (07/01/2023)   Exercise Vital Sign    Days of Exercise per Week: 2 days    Minutes of Exercise per Session: 30 min  Stress: No Stress Concern Present (07/01/2023)   [employer] of Occupational Health - Occupational Stress Questionnaire    Feeling of Stress : Not at all  Social Connections: Socially Integrated (07/01/2023)   Social Connection and Isolation Panel [NHANES]    Frequency of Communication with Friends and Family: More than three times a week    Frequency of Social Gatherings with Friends and Family: Three times a week    Attends Religious Services: More than 4 times per year    Active Member of Clubs or Organizations: Yes     Attends Banker Meetings: 1 to 4 times per year    Marital Status: Married    Tobacco Counseling Counseling given: Not Answered   Clinical Intake:  Pre-visit preparation completed: Yes  Pain : 0-10 Pain Score: 0-No pain     Diabetes: Yes CBG done?: No  How often do you need to have someone help you when you read instructions, pamphlets, or other written materials from your doctor or pharmacy?: 1 - Never  Interpreter Needed?: No  Information entered by :: Amarria Andreasen LPn   Activities of Daily Living    07/01/2023  8:25 AM 06/28/2023    6:09 PM  In your present state of health, do you have any difficulty performing the following activities:  Hearing? 0 0  Vision? 0 0  Difficulty concentrating or making decisions? 0 0  Walking or climbing stairs? 0 0  Dressing or bathing? 0 0  Doing errands, shopping? 0 0  Preparing Food and eating ? N N  Using the Toilet? N N  In the past six months, have you accidently leaked urine? Y Y  Do you have problems with loss of bowel control? N N  Managing your Medications? N N  Managing your Finances? N N  Housekeeping or managing your Housekeeping? N N    Patient Care Team: Duanne Butler DASEN, MD as PCP - General (Family Medicine) Nicholaus Sherlean CROME, [facility] (Inactive) as Pharmacist (Pharmacist) Curtis Hunt Golas, MD as Attending Physician (Ophthalmology)  Indicate any recent Medical Services you may have received from other than [facility] providers in the past year (date may be approximate).     Assessment:   This is a routine wellness examination for Curtis Hunt.  Hearing/Vision screen Hearing Screening - Comments:: No trouble hearing Vision Screening - Comments:: Curtis Hunt Up to date   Goals Addressed             This Visit's Progress    Weight (lb) < 200 lb (90.7 kg)         Depression Screen    07/01/2023    8:30 AM 04/05/2023    8:47 AM 06/25/2022    8:44 AM 04/30/2022    9:01 AM 06/19/2021    8:31 AM 10/25/2020   10:58  AM 01/20/2019    9:25 AM  PHQ 2/9 Scores  PHQ - 2 Score 0 0 0 0 0 0 0  PHQ- 9 Score 0          Fall Risk    07/01/2023    8:25 AM 06/28/2023    6:09 PM 04/05/2023    8:47 AM 06/25/2022    8:36 AM 04/30/2022    9:01 AM  Fall Risk   Falls in the past year? 0 0 0 0 0  Number falls in past yr: 0  0 0 0  Injury with Fall? 0  0 0 0  Risk for fall due to :     No Fall Risks  Follow up Falls evaluation completed;Education provided;Falls prevention discussed   Falls prevention discussed;Education provided;Falls evaluation completed Falls prevention discussed    MEDICARE RISK AT HOME: Medicare Risk at Home Any stairs in or around the home?: No If so, are there any without handrails?: No Home free of loose throw rugs in walkways, pet beds, electrical cords, etc?: Yes Adequate lighting in your home to reduce risk of falls?: Yes Life alert?: No Use of a cane, walker or w/c?: No Grab bars in the bathroom?: No Shower chair or bench in shower?: No Elevated toilet seat or a handicapped toilet?: No  TIMED UP AND GO:  Was the test performed?  No    Cognitive Function:        07/01/2023    8:27 AM 06/25/2022    8:47 AM 06/19/2021    8:39 AM  6CIT Screen  What Year? 0 points 0 points 0 points  What month? 0 points 0 points 0 points  What time? 0 points 0 points 0 points  Count back from 20 0 points 0 points 0 points  Months in reverse 0 points 0 points 0 points  Repeat phrase 0 points 0 points 0 points  Total Score 0 points 0 points 0 points    Immunizations Immunization History  Administered Date(s) Administered   Fluad Quad(high Dose 65+) 02/14/2019, 02/22/2020, 02/12/2021   Influenza Split 02/22/2013, 02/22/2014   Influenza, High Dose Seasonal PF 03/06/2023   Influenza-Unspecified 02/19/2015, 02/13/2016, 02/22/2017   PFIZER(Purple Top)SARS-COV-2 Vaccination 06/19/2019, 07/10/2019, 04/05/2020   PPD Test 09/13/2012, 08/02/2013, 08/20/2014, 09/03/2015, 09/21/2016   Pfizer Covid-19  Vaccine Bivalent Booster 58yrs & up 02/12/2021   Pfizer(Comirnaty)Fall Seasonal Vaccine 12 years and older 03/06/2023   Pneumococcal Conjugate-13 09/24/2017   Pneumococcal Polysaccharide-23 05/11/2014, 02/22/2020   Tdap 09/10/2010   Zoster Recombinant(Shingrix) 12/18/2021, 02/25/2022   Zoster, Live 09/13/2012    TDAP status: Due, Education has been provided regarding the importance of this vaccine. Advised may receive this vaccine at local pharmacy or Health Dept. Aware to provide a copy of the vaccination record if obtained from local pharmacy or Health Dept. Verbalized acceptance and understanding.  Flu Vaccine status: Up to date  Pneumococcal vaccine status: Up to date  Covid-19 vaccine status: Information provided on how to obtain vaccines.   Qualifies for Shingles Vaccine? No   Zostavax completed Yes   Shingrix Completed?: Yes  Screening Tests Health Maintenance  Topic Date Due   OPHTHALMOLOGY EXAM  01/16/2023   COVID-19 Vaccine (6 - 2024-25 season) 05/01/2023   HEMOGLOBIN A1C  09/26/2023   Diabetic kidney evaluation - eGFR measurement  03/28/2024   Diabetic kidney evaluation - Urine ACR  03/28/2024   FOOT EXAM  04/04/2024   Medicare Annual Wellness (AWV)  06/30/2024   Colonoscopy  01/01/2029   Pneumonia Vaccine 49+ Years old  Completed   INFLUENZA VACCINE  Completed   Hepatitis C Screening  Completed   Zoster Vaccines- Shingrix  Completed   HPV VACCINES  Aged Out   DTaP/Tdap/Td  Discontinued    Health Maintenance  Health Maintenance Due  Topic Date Due   OPHTHALMOLOGY EXAM  01/16/2023   COVID-19 Vaccine (6 - 2024-25 season) 05/01/2023    Colorectal cancer screening: Type of screening: Colonoscopy. Completed 2020. Repeat every 10 years  Lung Cancer Screening: (Low Dose CT Chest recommended if Age 53-80 years, 20 pack-year currently smoking OR have quit w/in 15years.) does not qualify.   Lung Cancer Screening Referral:   Additional Screening:  Hepatitis C  Screening: does not qualify; Completed 2019  Vision Screening: Recommended annual ophthalmology exams for early detection of glaucoma and other disorders of the eye. Is the patient up to date with their annual eye exam?  Yes  Who is the provider or what is the name of the office in which the patient attends annual eye exams? Curtis Hunt If pt is not established with a provider, would they like to be referred to a provider to establish care? No .   Dental Screening: Recommended annual dental exams for proper oral hygiene  Nutrition Risk Assessment:  Has the patient had any N/V/D within the last 2 months?  No  Does the patient have any non-healing wounds?  No  Has the patient had any unintentional weight loss or weight gain?  No   Diabetes:  Is the patient diabetic?  Yes  If diabetic, was a CBG obtained today?  No  Did the patient bring in their glucometer from home?  No  How often do you monitor your CBG's? Continious Glucouse Monitoring.   Financial Strains and Diabetes Management:  Are you having any financial strains with the device, your  supplies or your medication? No .  Does the patient want to be seen by Chronic Care Management for management of their diabetes?  No  Would the patient like to be referred to a Nutritionist or for Diabetic Management?  No   Diabetic Exams:  Diabetic Eye Exam: Completed  Pt has been advised about the importance in completing this exam.  Diabetic Foot Exam: Completed . Pt has been advised about the importance in completing this exam.   Community Resource Referral / Chronic Care Management: CRR required this visit?  No   CCM required this visit?  No     Plan:     I have personally reviewed and noted the following in the patient's chart:   Medical and social history Use of alcohol, tobacco or illicit drugs  Current medications and supplements including opioid prescriptions. Patient is not currently taking opioid prescriptions. Functional  ability and status Nutritional status Physical activity Advanced directives List of other physicians Hospitalizations, surgeries, and ER visits in previous 12 months Vitals Screenings to include cognitive, depression, and falls Referrals and appointments  In addition, I have reviewed and discussed with patient certain preventive protocols, quality metrics, and best practice recommendations. A written personalized care plan for preventive services as well as general preventive health recommendations were provided to patient.     Mliss Graff, LPN   11/24/7972   After Visit Summary: (MyChart) Due to this being a telephonic visit, the after visit summary with patients personalized plan was offered to patient via MyChart   Nurse Notes:

## 2023-07-01 NOTE — Patient Instructions (Signed)
 Curtis Hunt , Thank you for taking time to come for your Medicare Wellness Visit. I appreciate your ongoing commitment to your health goals. Please review the following plan we discussed and let me know if I can assist you in the future.   Screening recommendations/referrals: Colonoscopy: up to date Recommended yearly ophthalmology/optometry visit for glaucoma screening and checkup Recommended yearly dental visit for hygiene and checkup  Vaccinations: Influenza vaccine: up to date Pneumococcal vaccine: up to date Tdap vaccine: Education provided Shingles vaccine: up to date    Advanced directives: Education provided     Preventive Care 65 Years and Older, Male Preventive care refers to lifestyle choices and visits with your health care provider that can promote health and wellness. What does preventive care include? A yearly physical exam. This is also called an annual well check. Dental exams once or twice a year. Routine eye exams. Ask your health care provider how often you should have your eyes checked. Personal lifestyle choices, including: Daily care of your teeth and gums. Regular physical activity. Eating a healthy diet. Avoiding tobacco and drug use. Limiting alcohol use. Practicing safe sex. Taking low doses of aspirin  every day. Taking vitamin and mineral supplements as recommended by your health care provider. What happens during an annual well check? The services and screenings done by your health care provider during your annual well check will depend on your age, overall health, lifestyle risk factors, and family history of disease. Counseling  Your health care provider may ask you questions about your: Alcohol use. Tobacco use. Drug use. Emotional well-being. Home and relationship well-being. Sexual activity. Eating habits. History of falls. Memory and ability to understand (cognition). Work and work astronomer. Screening  You may have the following  tests or measurements: Height, weight, and BMI. Blood pressure. Lipid and cholesterol levels. These may be checked every 5 years, or more frequently if you are over 44 years old. Skin check. Lung cancer screening. You may have this screening every year starting at age 65 if you have a 30-pack-year history of smoking and currently smoke or have quit within the past 15 years. Fecal occult blood test (FOBT) of the stool. You may have this test every year starting at age 64. Flexible sigmoidoscopy or colonoscopy. You may have a sigmoidoscopy every 5 years or a colonoscopy every 10 years starting at age 38. Prostate cancer screening. Recommendations will vary depending on your family history and other risks. Hepatitis C blood test. Hepatitis B blood test. Sexually transmitted disease (STD) testing. Diabetes screening. This is done by checking your blood sugar (glucose) after you have not eaten for a while (fasting). You may have this done every 1-3 years. Abdominal aortic aneurysm (AAA) screening. You may need this if you are a current or former smoker. Osteoporosis. You may be screened starting at age 60 if you are at high risk. Talk with your health care provider about your test results, treatment options, and if necessary, the need for more tests. Vaccines  Your health care provider may recommend certain vaccines, such as: Influenza vaccine. This is recommended every year. Tetanus, diphtheria, and acellular pertussis (Tdap, Td) vaccine. You may need a Td booster every 10 years. Zoster vaccine. You may need this after age 70. Pneumococcal 13-valent conjugate (PCV13) vaccine. One dose is recommended after age 24. Pneumococcal polysaccharide (PPSV23) vaccine. One dose is recommended after age 6. Talk to your health care provider about which screenings and vaccines you need and how often you need them.  This information is not intended to replace advice given to you by your health care provider.  Make sure you discuss any questions you have with your health care provider. Document Released: 06/07/2015 Document Revised: 01/29/2016 Document Reviewed: 03/12/2015 Elsevier Interactive Patient Education  2017 [employer].  Fall Prevention in the Home Falls can cause injuries. They can happen to people of all ages. There are many things you can do to make your home safe and to help prevent falls. What can I do on the outside of my home? Regularly fix the edges of walkways and driveways and fix any cracks. Remove anything that might make you trip as you walk through a door, such as a raised step or threshold. Trim any bushes or trees on the path to your home. Use bright outdoor lighting. Clear any walking paths of anything that might make someone trip, such as rocks or tools. Regularly check to see if handrails are loose or broken. Make sure that both sides of any steps have handrails. Any raised decks and porches should have guardrails on the edges. Have any leaves, snow, or ice cleared regularly. Use sand or salt on walking paths during winter. Clean up any spills in your garage right away. This includes oil or grease spills. What can I do in the bathroom? Use night lights. Install grab bars by the toilet and in the tub and shower. Do not use towel bars as grab bars. Use non-skid mats or decals in the tub or shower. If you need to sit down in the shower, use a plastic, non-slip stool. Keep the floor dry. Clean up any water  that spills on the floor as soon as it happens. Remove soap buildup in the tub or shower regularly. Attach bath mats securely with double-sided non-slip rug tape. Do not have throw rugs and other things on the floor that can make you trip. What can I do in the bedroom? Use night lights. Make sure that you have a light by your bed that is easy to reach. Do not use any sheets or blankets that are too big for your bed. They should not hang down onto the floor. Have a  firm chair that has side arms. You can use this for support while you get dressed. Do not have throw rugs and other things on the floor that can make you trip. What can I do in the kitchen? Clean up any spills right away. Avoid walking on wet floors. Keep items that you use a lot in easy-to-reach places. If you need to reach something above you, use a strong step stool that has a grab bar. Keep electrical cords out of the way. Do not use floor polish or wax that makes floors slippery. If you must use wax, use non-skid floor wax. Do not have throw rugs and other things on the floor that can make you trip. What can I do with my stairs? Do not leave any items on the stairs. Make sure that there are handrails on both sides of the stairs and use them. Fix handrails that are broken or loose. Make sure that handrails are as long as the stairways. Check any carpeting to make sure that it is firmly attached to the stairs. Fix any carpet that is loose or worn. Avoid having throw rugs at the top or bottom of the stairs. If you do have throw rugs, attach them to the floor with carpet tape. Make sure that you have a light switch at the  top of the stairs and the bottom of the stairs. If you do not have them, ask someone to add them for you. What else can I do to help prevent falls? Wear shoes that: Do not have high heels. Have rubber bottoms. Are comfortable and fit you well. Are closed at the toe. Do not wear sandals. If you use a stepladder: Make sure that it is fully opened. Do not climb a closed stepladder. Make sure that both sides of the stepladder are locked into place. Ask someone to hold it for you, if possible. Clearly mark and make sure that you can see: Any grab bars or handrails. First and last steps. Where the edge of each step is. Use tools that help you move around (mobility aids) if they are needed. These include: Canes. Walkers. Scooters. Crutches. Turn on the lights when you  go into a dark area. Replace any light bulbs as soon as they burn out. Set up your furniture so you have a clear path. Avoid moving your furniture around. If any of your floors are uneven, fix them. If there are any pets around you, be aware of where they are. Review your medicines with your doctor. Some medicines can make you feel dizzy. This can increase your chance of falling. Ask your doctor what other things that you can do to help prevent falls. This information is not intended to replace advice given to you by your health care provider. Make sure you discuss any questions you have with your health care provider. Document Released: 03/07/2009 Document Revised: 10/17/2015 Document Reviewed: 06/15/2014 Elsevier Interactive Patient Education  2017 [employer].

## 2023-07-06 ENCOUNTER — Telehealth: Payer: Self-pay | Admitting: Family Medicine

## 2023-07-06 ENCOUNTER — Other Ambulatory Visit: Payer: Self-pay

## 2023-07-06 DIAGNOSIS — E119 Type 2 diabetes mellitus without complications: Secondary | ICD-10-CM

## 2023-07-06 MED ORDER — LANTUS SOLOSTAR 100 UNIT/ML ~~LOC~~ SOPN: 22.0000 [IU] | PEN_INJECTOR | Freq: Every day | SUBCUTANEOUS | 99 refills | Status: AC

## 2023-07-06 NOTE — Telephone Encounter (Signed)
Prescription Request  07/06/2023  LOV: 04/05/2023  What is the name of the medication or equipment?   LEVEMIR FLEXPEN 100 UNIT/ML SIG: INJECT 22 UNITS DAILY AT 10PM **Product backordered/unavailable**    Have you contacted your pharmacy to request a refill? Yes   Which pharmacy would you like this sent to?  [id number] [place], [place] - [address] [place] [address] Phone: [phone] Fax: [phone]    Patient notified that their request is being sent to the clinical staff for review and that they should receive a response within 2 business days.   Please advise pharmacist.

## 2023-07-21 ENCOUNTER — Other Ambulatory Visit: Payer: Self-pay

## 2023-07-21 ENCOUNTER — Telehealth: Payer: Self-pay

## 2023-07-21 MED ORDER — ROSUVASTATIN CALCIUM 40 MG PO TABS: 40.0000 mg | ORAL_TABLET | Freq: Every day | ORAL | 0 refills | Status: DC

## 2023-07-21 NOTE — Telephone Encounter (Signed)
 Prescription Request  07/21/2023  LOV: 04/05/23  What is the name of the medication or equipment? rosuvastatin (CRESTOR) 40 MG tablet [[id number]]   Have you contacted your pharmacy to request a refill? Yes   Which pharmacy would you like this sent to?  [id number] [place], [place] - [address] [place] [address] Phone: [phone] Fax: [phone]    Patient notified that their request is being sent to the clinical staff for review and that they should receive a response within 2 business days.   Please advise at [facility] [phone]

## 2023-09-17 ENCOUNTER — Telehealth: Payer: Self-pay

## 2023-09-17 NOTE — Telephone Encounter (Signed)
 Copied from CRM [id number]. Topic: Clinical - Request for Lab/Test Order >> Sep 17, 2023  8:35 AM Ivette P wrote: Reason for CRM: PT called in to see if he needs to get his A1c checked. Pt would also know if he would need to get any blood work done. Please call and schedule pt if A1c and blood test are needed.    Thank you  Pt callback Number [phone]

## 2023-09-30 ENCOUNTER — Other Ambulatory Visit: Payer: Self-pay

## 2023-09-30 DIAGNOSIS — E119 Type 2 diabetes mellitus without complications: Secondary | ICD-10-CM

## 2023-09-30 NOTE — Telephone Encounter (Signed)
 Prescription Request  09/30/2023  LOV: 04/05/23  What is the name of the medication or equipment? Semaglutide , 2 MG/DOSE, 8 MG/3ML SOPN [[id number]]   Have you contacted your pharmacy to request a refill? Yes   Which pharmacy would you like this sent to?  [id number] [place], [place] - [address] [place] [address] Phone: [phone] Fax: [phone]    Patient notified that their request is being sent to the clinical staff for review and that they should receive a response within 2 business days.   Please advise at [facility] [phone]

## 2023-10-04 MED ORDER — SEMAGLUTIDE (2 MG/DOSE) 8 MG/3ML ~~LOC~~ SOPN: 2.0000 mg | PEN_INJECTOR | SUBCUTANEOUS | 0 refills | Status: DC

## 2023-10-04 NOTE — Telephone Encounter (Signed)
 Requested Prescriptions  Pending Prescriptions Disp Refills   Semaglutide , 2 MG/DOSE, 8 MG/3ML SOPN 3 mL 0    Sig: Inject 2 mg as directed once a week.     Endocrinology:  Diabetes - GLP-1 Receptor Agonists - semaglutide  Failed - 10/04/2023  9:33 AM      Failed - HBA1C in normal range and within 180 days    Hgb A1c MFr Bld  Date Value Ref Range Status  03/29/2023 7.7 (H) <5.7 % of total Hgb Final    Comment:    For someone without known diabetes, a hemoglobin A1c value of 6.5% or greater indicates that they may have  diabetes and this should be confirmed with a follow-up  test. . For someone with known diabetes, a value <7% indicates  that their diabetes is well controlled and a value  greater than or equal to 7% indicates suboptimal  control. A1c targets should be individualized based on  duration of diabetes, age, comorbid conditions, and  other considerations. . Currently, no consensus exists regarding use of hemoglobin A1c for diagnosis of diabetes for children. .          Failed - Cr in normal range and within 360 days    Creat  Date Value Ref Range Status  03/29/2023 1.29 (H) 0.70 - 1.28 mg/dL Final   Creatinine, Urine  Date Value Ref Range Status  03/29/2023 125 20 - 320 mg/dL Final         Passed - Valid encounter within last 6 months    Recent Outpatient Visits           6 months ago Controlled type 2 diabetes mellitus without complication, with long-term current use of insulin  [facility]   [place] [facility] Medicine Austine Lefort, MD   12 months ago Benign essential HTN   [place] [facility] Pickard, Cisco Crest, MD   1 year ago Benign essential HTN   [place] [facility] Medicine Pickard, Cisco Crest, MD   1 year ago Anemia, unspecified type   [place] [facility] Medicine Pickard, Cisco Crest, MD

## 2023-10-12 ENCOUNTER — Other Ambulatory Visit: Payer: Self-pay | Admitting: Family Medicine

## 2023-10-12 DIAGNOSIS — Z794 Long term (current) use of insulin: Secondary | ICD-10-CM

## 2023-10-13 NOTE — Telephone Encounter (Signed)
 Courtesy refill. Requested Prescriptions  Pending Prescriptions Disp Refills   Continuous Glucose Sensor (DEXCOM G7 SENSOR) MISC [Pharmacy Med Name: DEXCOM G7 SENSOR] 3 each     Sig: 1 DEVICE BY DOES NOT APPLY ROUTE AS DIRECTED.     Endocrinology: Diabetes - Testing Supplies Passed - 10/13/2023  2:14 PM      Passed - Valid encounter within last 12 months    Recent Outpatient Visits           6 months ago Controlled type 2 diabetes mellitus without complication, with long-term current use of insulin  [facility])   [place] [facility] Medicine Pickard, Cisco Crest, MD   1 year ago Benign essential HTN   [place] [facility] Austine Lefort, MD   1 year ago Benign essential HTN   [place] [facility] Family Medicine Pickard, Cisco Crest, MD   1 year ago Anemia, unspecified type   [place] [facility] Family Medicine Pickard, Cisco Crest, MD

## 2023-10-18 ENCOUNTER — Other Ambulatory Visit: Payer: Self-pay | Admitting: Family Medicine

## 2023-10-29 ENCOUNTER — Other Ambulatory Visit: Payer: Self-pay | Admitting: Family Medicine

## 2023-10-29 DIAGNOSIS — I10 Essential (primary) hypertension: Secondary | ICD-10-CM

## 2023-10-29 DIAGNOSIS — E119 Type 2 diabetes mellitus without complications: Secondary | ICD-10-CM

## 2023-11-09 ENCOUNTER — Other Ambulatory Visit: Payer: Self-pay | Admitting: Family Medicine

## 2023-11-09 DIAGNOSIS — Z794 Long term (current) use of insulin: Secondary | ICD-10-CM

## 2023-11-22 ENCOUNTER — Telehealth: Payer: Self-pay

## 2023-11-22 ENCOUNTER — Other Ambulatory Visit: Payer: Self-pay

## 2023-11-22 DIAGNOSIS — E119 Type 2 diabetes mellitus without complications: Secondary | ICD-10-CM

## 2023-11-22 MED ORDER — SEMAGLUTIDE (2 MG/DOSE) 8 MG/3ML ~~LOC~~ SOPN: 2.0000 mg | PEN_INJECTOR | SUBCUTANEOUS | 0 refills | Status: DC

## 2023-11-22 NOTE — Telephone Encounter (Signed)
 Sent in medication

## 2023-11-22 NOTE — Telephone Encounter (Signed)
 Prescription Request  11/22/2023  LOV: 04/05/23  What is the name of the medication or equipment? Semaglutide , 2 MG/DOSE, 8 MG/3ML SOPN [[id number]]   Have you contacted your pharmacy to request a refill? Yes   Which pharmacy would you like this sent to?  [id number] [place], [place] - [address] [place] [address] Phone: [phone] Fax: [phone]    Patient notified that their request is being sent to the clinical staff for review and that they should receive a response within 2 business days.   Please advise at [facility] [phone]

## 2023-12-11 ENCOUNTER — Other Ambulatory Visit: Payer: Self-pay | Admitting: Family Medicine

## 2023-12-11 DIAGNOSIS — Z794 Long term (current) use of insulin: Secondary | ICD-10-CM

## 2023-12-13 NOTE — Telephone Encounter (Signed)
 Requested Prescriptions  Pending Prescriptions Disp Refills   Continuous Glucose Sensor (DEXCOM G7 SENSOR) MISC [Pharmacy Med Name: DEXCOM G7 SENSOR] 3 each 2    Sig: 1 DEVICE BY DOES NOT APPLY ROUTE AS DIRECTED.     Endocrinology: Diabetes - Testing Supplies Passed - 12/13/2023  4:52 PM      Passed - Valid encounter within last 12 months    Recent Outpatient Visits           8 months ago Controlled type 2 diabetes mellitus without complication, with long-term current use of insulin  [facility])   [place] [facility] Medicine Pickard, Butler DASEN, MD   1 year ago Benign essential HTN   [place] [facility] Medicine Duanne Butler DASEN, MD   1 year ago Benign essential HTN   [place] [facility] Family Medicine Pickard, Butler DASEN, MD   1 year ago Anemia, unspecified type    [facility] Family Medicine Pickard, Butler DASEN, MD

## 2023-12-22 DIAGNOSIS — H2513 Age-related nuclear cataract, bilateral: Secondary | ICD-10-CM | POA: Diagnosis not present

## 2023-12-22 DIAGNOSIS — H524 Presbyopia: Secondary | ICD-10-CM | POA: Diagnosis not present

## 2023-12-22 DIAGNOSIS — H5203 Hypermetropia, bilateral: Secondary | ICD-10-CM | POA: Diagnosis not present

## 2023-12-22 DIAGNOSIS — H52223 Regular astigmatism, bilateral: Secondary | ICD-10-CM | POA: Diagnosis not present

## 2023-12-22 DIAGNOSIS — H43813 Vitreous degeneration, bilateral: Secondary | ICD-10-CM | POA: Diagnosis not present

## 2023-12-22 DIAGNOSIS — E119 Type 2 diabetes mellitus without complications: Secondary | ICD-10-CM | POA: Diagnosis not present

## 2024-01-13 ENCOUNTER — Other Ambulatory Visit: Payer: Self-pay | Admitting: Family Medicine

## 2024-01-14 ENCOUNTER — Other Ambulatory Visit: Payer: Self-pay

## 2024-01-14 DIAGNOSIS — E119 Type 2 diabetes mellitus without complications: Secondary | ICD-10-CM

## 2024-01-14 NOTE — Telephone Encounter (Signed)
 Requested medications are due for refill today.  yes  Requested medications are on the active medications list.  yes  Last refill. 11/22/2023 3mL 0 rf  Future visit scheduled.   yes  Notes to clinic.  Pt is more than 3 months over due for an OV. Last seen 04/05/2023.    Requested Prescriptions  Pending Prescriptions Disp Refills   Semaglutide , 2 MG/DOSE, 8 MG/3ML SOPN 3 mL 0    Sig: Inject 2 mg as directed once a week.     Endocrinology:  Diabetes - GLP-1 Receptor Agonists - semaglutide  Failed - 01/14/2024  5:29 PM      Failed - HBA1C in normal range and within 180 days    Hgb A1c MFr Bld  Date Value Ref Range Status  03/29/2023 7.7 (H) <5.7 % of total Hgb Final    Comment:    For someone without known diabetes, a hemoglobin A1c value of 6.5% or greater indicates that they may have  diabetes and this should be confirmed with a follow-up  test. . For someone with known diabetes, a value <7% indicates  that their diabetes is well controlled and a value  greater than or equal to 7% indicates suboptimal  control. A1c targets should be individualized based on  duration of diabetes, age, comorbid conditions, and  other considerations. . Currently, no consensus exists regarding use of hemoglobin A1c for diagnosis of diabetes for children. .          Failed - Cr in normal range and within 360 days    Creat  Date Value Ref Range Status  03/29/2023 1.29 (H) 0.70 - 1.28 mg/dL Final   Creatinine, Urine  Date Value Ref Range Status  03/29/2023 125 20 - 320 mg/dL Final         Failed - Valid encounter within last 6 months    Recent Outpatient Visits           9 months ago Controlled type 2 diabetes mellitus without complication, with long-term current use of insulin  [facility])   [place] [facility] Medicine Pickard, Butler DASEN, MD   1 year ago Benign essential HTN   [place] [facility] Medicine Duanne Butler DASEN, MD   1 year ago Benign essential HTN    [place] [facility] Family Medicine Pickard, Butler DASEN, MD   1 year ago Anemia, unspecified type    [facility] Family Medicine Pickard, Butler DASEN, MD

## 2024-01-14 NOTE — Telephone Encounter (Signed)
 Requested Prescriptions  Pending Prescriptions Disp Refills   rosuvastatin  (CRESTOR ) 40 MG tablet [Pharmacy Med Name: ROSUVASTATIN  CALCIUM  40 MG TAB] 90 tablet 0    Sig: TAKE 1 TABLET BY MOUTH EVERY DAY     Cardiovascular:  Antilipid - Statins 2 Failed - 01/14/2024  3:08 PM      Failed - Cr in normal range and within 360 days    Creat  Date Value Ref Range Status  03/29/2023 1.29 (H) 0.70 - 1.28 mg/dL Final   Creatinine, Urine  Date Value Ref Range Status  03/29/2023 125 20 - 320 mg/dL Final         Failed - Lipid Panel in normal range within the last 12 months    Cholesterol  Date Value Ref Range Status  03/29/2023 137 <200 mg/dL Final   LDL Cholesterol (Calc)  Date Value Ref Range Status  03/29/2023 68 mg/dL (calc) Final    Comment:    Reference range: <100 . Desirable range <100 mg/dL for primary prevention;   <70 mg/dL for patients with CHD or diabetic patients  with > or = 2 CHD risk factors. SABRA LDL-C is now calculated using the Martin-Hopkins  calculation, which is a validated novel method providing  better accuracy than the Friedewald equation in the  estimation of LDL-C.  Gladis APPLETHWAITE et al. SANDREA. 7986;689(80): 2061-2068  (http://education.[employer].com/faq/FAQ164)    HDL  Date Value Ref Range Status  03/29/2023 46 > OR = 40 mg/dL Final   Triglycerides  Date Value Ref Range Status  03/29/2023 155 (H) <150 mg/dL Final         Passed - Patient is not pregnant      Passed - Valid encounter within last 12 months    Recent Outpatient Visits           9 months ago Controlled type 2 diabetes mellitus without complication, with long-term current use of insulin  [facility])   [place] [facility] Family Medicine Pickard, Butler DASEN, MD   1 year ago Benign essential HTN   [place] [facility] Medicine Duanne Butler DASEN, MD   1 year ago Benign essential HTN   [place] [facility] Medicine Pickard, Butler DASEN, MD   1 year ago Anemia,  unspecified type   [place] [facility] Family Medicine Pickard, Butler DASEN, MD

## 2024-01-14 NOTE — Telephone Encounter (Signed)
 Prescription Request  01/14/2024  LOV:04/05/23  What is the name of the medication or equipment? Semaglutide , 2 MG/DOSE, 8 MG/3ML SOPN [[id number]]   Have you contacted your pharmacy to request a refill? Yes   Which pharmacy would you like this sent to?  [id number] [place], [place] - [address] [place] [address] Phone: [phone] Fax: [phone]    Patient notified that their request is being sent to the clinical staff for review and that they should receive a response within 2 business days.   Please advise at [facility] [phone]

## 2024-01-18 MED ORDER — SEMAGLUTIDE (2 MG/DOSE) 8 MG/3ML ~~LOC~~ SOPN: 2.0000 mg | PEN_INJECTOR | SUBCUTANEOUS | 0 refills | Status: DC

## 2024-03-12 ENCOUNTER — Other Ambulatory Visit: Payer: Self-pay | Admitting: Family Medicine

## 2024-03-12 DIAGNOSIS — E119 Type 2 diabetes mellitus without complications: Secondary | ICD-10-CM

## 2024-03-15 ENCOUNTER — Telehealth: Payer: Self-pay

## 2024-03-15 NOTE — Telephone Encounter (Signed)
 Copied from CRM [id number]. Topic: Clinical - Request for Lab/Test Order >> Mar 14, 2024  4:34 PM Curtis Hunt wrote: Reason for CRM: patient would like blood work done to check his A1c. He is scheduled for Monday OCT 27th

## 2024-03-20 ENCOUNTER — Other Ambulatory Visit

## 2024-03-20 DIAGNOSIS — E785 Hyperlipidemia, unspecified: Secondary | ICD-10-CM

## 2024-03-20 DIAGNOSIS — E119 Type 2 diabetes mellitus without complications: Secondary | ICD-10-CM

## 2024-03-20 DIAGNOSIS — D649 Anemia, unspecified: Secondary | ICD-10-CM

## 2024-03-20 DIAGNOSIS — I10 Essential (primary) hypertension: Secondary | ICD-10-CM

## 2024-03-21 ENCOUNTER — Telehealth: Payer: Self-pay

## 2024-03-21 ENCOUNTER — Ambulatory Visit: Payer: Self-pay | Admitting: Family Medicine

## 2024-03-21 ENCOUNTER — Other Ambulatory Visit: Payer: Self-pay

## 2024-03-21 LAB — HEMOGLOBIN A1C
Hgb A1c MFr Bld: 6.4 % — ABNORMAL HIGH (ref <5.7)
Mean Plasma Glucose: 137 mg/dL
eAG (mmol/L): 7.6 mmol/L

## 2024-03-21 LAB — COMPLETE METABOLIC PANEL WITHOUT GFR
AG Ratio: 1.4 (calc) (ref 1.0–2.5)
ALT: 11 U/L (ref 9–46)
AST: 16 U/L (ref 10–35)
Albumin: 4.1 g/dL (ref 3.6–5.1)
Alkaline phosphatase (APISO): 59 U/L (ref 35–144)
BUN/Creatinine Ratio: 15 (calc) (ref 6–22)
BUN: 20 mg/dL (ref 7–25)
CO2: 25 mmol/L (ref 20–32)
Calcium: 9.2 mg/dL (ref 8.6–10.3)
Chloride: 104 mmol/L (ref 98–110)
Creat: 1.3 mg/dL — ABNORMAL HIGH (ref 0.70–1.28)
Globulin: 2.9 g/dL (ref 1.9–3.7)
Glucose, Bld: 100 mg/dL — ABNORMAL HIGH (ref 65–99)
Potassium: 4.2 mmol/L (ref 3.5–5.3)
Sodium: 139 mmol/L (ref 135–146)
Total Bilirubin: 0.2 mg/dL (ref 0.2–1.2)
Total Protein: 7 g/dL (ref 6.1–8.1)

## 2024-03-21 LAB — LIPID PANEL
Cholesterol: 129 mg/dL (ref <200)
HDL: 56 mg/dL (ref >=40)
LDL Cholesterol (Calc): 56 mg/dL
Non-HDL Cholesterol (Calc): 73 mg/dL (ref <130)
Total CHOL/HDL Ratio: 2.3 (calc) (ref <5.0)
Triglycerides: 83 mg/dL (ref <150)

## 2024-03-21 LAB — CBC WITH DIFFERENTIAL/PLATELET
Absolute Lymphocytes: 2120 {cells}/uL (ref 850–3900)
Absolute Monocytes: 541 {cells}/uL (ref 200–950)
Basophils Absolute: 111 {cells}/uL (ref 0–200)
Basophils Relative: 2.1 %
Eosinophils Absolute: 201 {cells}/uL (ref 15–500)
Eosinophils Relative: 3.8 %
HCT: 35.8 % — ABNORMAL LOW (ref 38.5–50.0)
Hemoglobin: 11.4 g/dL — ABNORMAL LOW (ref 13.2–17.1)
MCH: 28 pg (ref 27.0–33.0)
MCHC: 31.8 g/dL — ABNORMAL LOW (ref 32.0–36.0)
MCV: 88 fL (ref 80.0–100.0)
MPV: 10.7 fL (ref 7.5–12.5)
Monocytes Relative: 10.2 %
Neutro Abs: 2327 {cells}/uL (ref 1500–7800)
Neutrophils Relative %: 43.9 %
Platelets: 235 Thousand/uL (ref 140–400)
RBC: 4.07 Million/uL — ABNORMAL LOW (ref 4.20–5.80)
RDW: 14.3 % (ref 11.0–15.0)
Total Lymphocyte: 40 %
WBC: 5.3 Thousand/uL (ref 3.8–10.8)

## 2024-03-21 LAB — MICROALBUMIN / CREATININE URINE RATIO
Creatinine, Urine: 139 mg/dL (ref 20–320)
Microalb Creat Ratio: 31 mg/g{creat} — ABNORMAL HIGH (ref <30)
Microalb, Ur: 4.3 mg/dL

## 2024-03-21 LAB — VITAMIN B12: Vitamin B-12: 418 pg/mL (ref 200–1100)

## 2024-03-21 MED ORDER — INSULIN PEN NEEDLE 31G X 8 MM MISC: 1 refills | Status: DC

## 2024-03-21 NOTE — Telephone Encounter (Signed)
 Sent in medication

## 2024-03-21 NOTE — Telephone Encounter (Signed)
 Prescription Request  03/21/2024  LOV: 04/05/23  What is the name of the medication or equipment? Insulin  Pen Needle (B-D ULTRAFINE III SHORT PEN) 31G X 8 MM MISC [[id number]]   Have you contacted your pharmacy to request a refill? Yes   Which pharmacy would you like this sent to?  [id number] [place], [place] - [address] [place] [address] Phone: [phone] Fax: [phone]    Patient notified that their request is being sent to the clinical staff for review and that they should receive a response within 2 business days.   Please advise at [facility] [phone]

## 2024-04-10 ENCOUNTER — Ambulatory Visit: Admitting: Podiatry

## 2024-04-10 ENCOUNTER — Encounter: Payer: Self-pay | Admitting: Podiatry

## 2024-04-10 DIAGNOSIS — L600 Ingrowing nail: Secondary | ICD-10-CM

## 2024-04-10 DIAGNOSIS — B351 Tinea unguium: Secondary | ICD-10-CM

## 2024-04-10 DIAGNOSIS — M79672 Pain in left foot: Secondary | ICD-10-CM

## 2024-04-10 DIAGNOSIS — M79671 Pain in right foot: Secondary | ICD-10-CM | POA: Diagnosis not present

## 2024-04-10 NOTE — Progress Notes (Signed)
 Patient presents for evaluation and treatment of tenderness and some redness around nails feet.  Tenderness around toes with walking and wearing shoes.  Big nails get sore and loose the nailbed with the skin getting irritated.  Physical exam:  General appearance: Alert, pleasant, and in no acute distress.  Vascular: Pedal pulses: DP 2/4 B/L, PT 2/4 B/L.  Mild to moderate edema lower legs bilaterally.  Capillary refill time immediate bilaterally  Neurologic: Light touch intact bilaterally.  Normal Achilles tendon reflex.  Dermatologic:  Nails thickened, disfigured, discolored 1-5 BL with subungual debris.  Redness and hypertrophic nail folds along nail folds bilaterally but no signs of drainage or infection.  Hallux nails are thickened dystrophic.  Nails are limited to nailbed with irritation along the nail folds.  Some irritation along the proximal nail fold on the right hallux  Musculoskeletal:  Hammertoes 2 through 5 bilaterally.  Normal muscle strength lower extremity bilateral   Diagnosis: 1. Painful onychomycotic nails 1 through 5 bilaterally. 2. Pain toes 1 through 5 bilaterally. 3.  Ingrown nails hallux bilaterally  Plan: -New patient office visit for evaluation and management level 3.  Modifier 25. - Discussed with him the ingrown hallux nails.  Probably best to try to keep these trimmed down.  If they do become more of a problem or start getting infected would recommend matrixectomy.  -Debrided onychomycotic nails 1 through 5 bilaterally.  Sharply debrided nails with nail clipper and reduced with a power bur.  Return 3 months [facility]

## 2024-04-11 ENCOUNTER — Other Ambulatory Visit: Payer: Self-pay | Admitting: Family Medicine

## 2024-04-13 NOTE — Telephone Encounter (Signed)
 Call to patient- appointment scheduled. Courtesy refill given Requested Prescriptions  Pending Prescriptions Disp Refills   rosuvastatin  (CRESTOR ) 40 MG tablet [Pharmacy Med Name: ROSUVASTATIN  CALCIUM  40 MG TAB] 90 tablet 0    Sig: TAKE 1 TABLET BY MOUTH EVERY DAY     Cardiovascular:  Antilipid - Statins 2 Failed - 04/13/2024  2:00 PM      Failed - Cr in normal range and within 360 days    Creat  Date Value Ref Range Status  03/20/2024 1.30 (H) 0.70 - 1.28 mg/dL Final   Creatinine, Urine  Date Value Ref Range Status  03/20/2024 139 20 - 320 mg/dL Final         Failed - Lipid Panel in normal range within the last 12 months    Cholesterol  Date Value Ref Range Status  03/20/2024 129 <200 mg/dL Final   LDL Cholesterol (Calc)  Date Value Ref Range Status  03/20/2024 56 mg/dL (calc) Final    Comment:    Reference range: <100 . Desirable range <100 mg/dL for primary prevention;   <70 mg/dL for patients with CHD or diabetic patients  with > or = 2 CHD risk factors. SABRA LDL-C is now calculated using the Martin-Hopkins  calculation, which is a validated novel method providing  better accuracy than the Friedewald equation in the  estimation of LDL-C.  Gladis APPLETHWAITE et al. SANDREA. 7986;689(80): 2061-2068  (http://education.[facility].com/faq/FAQ164)    HDL  Date Value Ref Range Status  03/20/2024 56 > OR = 40 mg/dL Final   Triglycerides  Date Value Ref Range Status  03/20/2024 83 <150 mg/dL Final         Passed - Patient is not pregnant      Passed - Valid encounter within last 12 months    Recent Outpatient Visits           1 year ago Controlled type 2 diabetes mellitus without complication, with long-term current use of insulin  [facility])   [place] [facility] Medicine Pickard, Butler DASEN, MD   1 year ago Benign essential HTN   [place] [facility] Medicine Duanne Butler DASEN, MD   1 year ago Benign essential HTN   [place] [facility] Family  Medicine Pickard, Butler DASEN, MD   1 year ago Anemia, unspecified type   [place] [facility] Family Medicine Pickard, Butler DASEN, MD

## 2024-05-06 ENCOUNTER — Other Ambulatory Visit: Payer: Self-pay | Admitting: Family Medicine

## 2024-05-06 DIAGNOSIS — E119 Type 2 diabetes mellitus without complications: Secondary | ICD-10-CM

## 2024-05-06 DIAGNOSIS — I10 Essential (primary) hypertension: Secondary | ICD-10-CM

## 2024-06-01 ENCOUNTER — Ambulatory Visit: Admitting: Family Medicine

## 2024-06-01 ENCOUNTER — Encounter: Payer: Self-pay | Admitting: Family Medicine

## 2024-06-01 VITALS — BP 130/78 | HR 68 | Temp 98.8°F | Ht 74.0 in | Wt 268.2 lb

## 2024-06-01 DIAGNOSIS — Z794 Long term (current) use of insulin: Secondary | ICD-10-CM | POA: Diagnosis not present

## 2024-06-01 DIAGNOSIS — Z0001 Encounter for general adult medical examination with abnormal findings: Secondary | ICD-10-CM

## 2024-06-01 DIAGNOSIS — Z0000 Encounter for general adult medical examination without abnormal findings: Secondary | ICD-10-CM

## 2024-06-01 DIAGNOSIS — I10 Essential (primary) hypertension: Secondary | ICD-10-CM | POA: Diagnosis not present

## 2024-06-01 DIAGNOSIS — E785 Hyperlipidemia, unspecified: Secondary | ICD-10-CM

## 2024-06-01 DIAGNOSIS — E119 Type 2 diabetes mellitus without complications: Secondary | ICD-10-CM

## 2024-06-01 DIAGNOSIS — Z8546 Personal history of malignant neoplasm of prostate: Secondary | ICD-10-CM

## 2024-06-01 NOTE — Progress Notes (Signed)
 "  Subjective:    Patient ID: Curtis Hunt, male    DOB: [birth year], 74 y.o.   MRN: [id number] Patient is a very pleasant 74 year old African-American gentleman here today for physical exam.  Past medical history significant for prostate cancer status post surgery.  He is overdue to check a PSA level to evaluate for recurrence.  The patient had a colonoscopy in 2020 and they recommended a repeat colonoscopy in 2027.  Reviewing his immunization records below show that the patient is due for a tetanus shot as well as an RSV vaccine.  We discussed these today and he declined vaccinations at this time Immunization History  Administered Date(s) Administered   Fluad Quad(high Dose 65+) 02/14/2019, 02/22/2020, 02/12/2021   INFLUENZA, HIGH DOSE SEASONAL PF 03/06/2023   Influenza Split 02/22/2013, 02/22/2014   Influenza-Unspecified 02/19/2015, 02/13/2016, 02/22/2017   PFIZER(Purple Top)SARS-COV-2 Vaccination 06/19/2019, 07/10/2019, 04/05/2020   PPD Test 09/13/2012, 08/02/2013, 08/20/2014, 09/03/2015, 09/21/2016   Pfizer Covid-19 Vaccine Bivalent Booster 45yrs & up 02/12/2021   Pfizer(Comirnaty)Fall Seasonal Vaccine 12 years and older 02/15/2024   Pneumococcal Conjugate-13 09/24/2017   Pneumococcal Polysaccharide-23 05/11/2014, 02/22/2020   Tdap 09/10/2010   Zoster Recombinant(Shingrix) 12/18/2021, 02/25/2022   Zoster, Live 09/13/2012   Diabetic foot exam was performed today and was outstanding.  He is due for an eye exam however he sees his ophthalmologist once a year and he schedules this himself.  His blood pressure today is outstanding.  His most recent A1c in October was 6.4.  Past Medical History:  Diagnosis Date   Arthritis    Cancer (HCC) 2004   Prostate CA with surgery   Diabetes mellitus without complication (HCC)    Gout    Hyperlipidemia    Prostate cancer [facility])    Past Surgical History:  Procedure Laterality Date   JOINT REPLACEMENT Right 12/18/2019   total hip replacement w/  Dr. Jerri   PROSTATE SURGERY     TOTAL HIP ARTHROPLASTY Right 12/18/2019   Procedure: RIGHT TOTAL HIP ARTHROPLASTY ANTERIOR APPROACH;  Surgeon: Jerri Kay HERO, MD;  Location: [facility] OR;  Service: Orthopedics;  Laterality: Right;   TOTAL HIP ARTHROPLASTY Left 09/30/2020   Procedure: LEFT TOTAL HIP ARTHROPLASTY ANTERIOR APPROACH;  Surgeon: Jerri Kay HERO, MD;  Location: [facility] OR;  Service: Orthopedics;  Laterality: Left;   Current Outpatient Medications on File Prior to Visit  Medication Sig Dispense Refill   aspirin  EC 81 MG tablet Take 1 tablet (81 mg total) by mouth 2 (two) times daily. To be taken after surgery 84 tablet 0   Blood Glucose Monitoring Suppl (ONE TOUCH ULTRA 2) w/Device KIT ONE TOUCH ULTRA 2 GLUCOSE SYSTEM W/ DEVICE KIT 1 kit 0   Continuous Blood Gluc Receiver (DEXCOM G7 RECEIVER) DEVI 1 Device by Does not apply route as directed. 1 each 0   Continuous Glucose Sensor (DEXCOM G7 SENSOR) MISC APPLY 1 SENSOR AS INSTRUCTED EVERY 10 DAYS 3 each 2   glucose blood (ONETOUCH ULTRA) test strip CHECK SUGAR 4 TO 5 TIMES DAILY EVERY MORNING, AFTER MEALS, AND AT BEDTIME E11.65 100 strip 11   insulin  glargine (LANTUS  SOLOSTAR) 100 UNIT/ML Solostar Pen Inject 22 Units into the skin daily. 15 mL PRN   Insulin  Pen Needle 31G X 8 MM MISC USE AS DIRECTED TWICE DAILY 100 each 1   losartan  (COZAAR ) 100 MG tablet TAKE 1 TABLET BY MOUTH EVERY DAY 90 tablet 1   rosuvastatin  (CRESTOR ) 40 MG tablet TAKE 1 TABLET BY MOUTH EVERY DAY 90 tablet  0   Semaglutide , 2 MG/DOSE, 8 MG/3ML SOPN Inject 2 mg as directed once a week. 3 mL 0   sildenafil  (VIAGRA ) 100 MG tablet TAKE 1/2 - 1 TABLET BY MOUTH DAILY AS NEEDED FOR ERECTILE DYSFUNCTION 4 tablet 14   traZODone  (DESYREL ) 50 MG tablet Take 1 tablet (50 mg total) by mouth at bedtime as needed for sleep. 30 tablet 2   No current facility-administered medications on file prior to visit.   No Known Allergies Social History   Socioeconomic History   Marital status: Married     Spouse name: Not on file   Number of children: 3   Years of education: Not on file   Highest education level: Not on file  Occupational History   Not on file  Tobacco Use   Smoking status: Never   Smokeless tobacco: Never  Vaping Use   Vaping status: Never Used  Substance and Sexual Activity   Alcohol use: Yes    Comment: Beer prior to diagnosis of diabetes, about once a week during sports on tv   Drug use: No   Sexual activity: Not Currently  Other Topics Concern   Not on file  Social History Narrative   5 grandchildren and 2 great grandchildren.   1st wife died in July 11, 2015, married x 45 years.   Remarried 2nd wife 12/2020.    Social Drivers of Health   Tobacco Use: Low Risk (06/01/2024)   Patient History    Smoking Tobacco Use: Never    Smokeless Tobacco Use: Never    Passive Exposure: Not on file  Financial Resource Strain: Low Risk (07/01/2023)   Overall Financial Resource Strain (CARDIA)    Difficulty of Paying Living Expenses: Not hard at all  Food Insecurity: No Food Insecurity (07/01/2023)   Hunger Vital Sign    Worried About Running Out of Food in the Last Year: Never true    Ran Out of Food in the Last Year: Never true  Transportation Needs: No Transportation Needs (07/01/2023)   PRAPARE - Administrator, Civil Service (Medical): No    Lack of Transportation (Non-Medical): No  Physical Activity: Insufficiently Active (07/01/2023)   Exercise Vital Sign    Days of Exercise per Week: 2 days    Minutes of Exercise per Session: 30 min  Stress: No Stress Concern Present (07/01/2023)   [employer] of Occupational Health - Occupational Stress Questionnaire    Feeling of Stress : Not at all  Social Connections: Socially Integrated (07/01/2023)   Social Connection and Isolation Panel    Frequency of Communication with Friends and Family: More than three times a week    Frequency of Social Gatherings with Friends and Family: Three times a week    Attends Religious  Services: More than 4 times per year    Active Member of Clubs or Organizations: Yes    Attends Banker Meetings: 1 to 4 times per year    Marital Status: Married  Catering Manager Violence: Not At Risk (07/01/2023)   Humiliation, Afraid, Rape, and Kick questionnaire    Fear of Current or Ex-Partner: No    Emotionally Abused: No    Physically Abused: No    Sexually Abused: No  Depression (PHQ2-9): Low Risk (07/01/2023)   Depression (PHQ2-9)    PHQ-2 Score: 0  Alcohol Screen: Low Risk (07/01/2023)   Alcohol Screen    Last Alcohol Screening Score (AUDIT): 0  Housing: Unknown (07/01/2023)   Housing Stability Vital Sign  Unable to Pay for Housing in the Last Year: No    Number of Times Moved in the Last Year: Not on file    Homeless in the Last Year: No  Utilities: Not At Risk (07/01/2023)   AHC Utilities    Threatened with loss of utilities: No  Health Literacy: Adequate Health Literacy (07/01/2023)   B1300 Health Literacy    Frequency of need for help with medical instructions: Never   Family History  Problem Relation Age of Onset   Cancer Brother        prostate   Cancer Mother        breast ca     Review of Systems  All other systems reviewed and are negative.      Objective:   Physical Exam Vitals reviewed.  Constitutional:      General: He is not in acute distress.    Appearance: He is well-developed. He is not diaphoretic.  HENT:     Head: Normocephalic and atraumatic.     Right Ear: External ear normal.     Left Ear: External ear normal.     Nose: Nose normal.     Mouth/Throat:     Pharynx: No oropharyngeal exudate.  Eyes:     General: No scleral icterus.       Right eye: No discharge.        Left eye: No discharge.     Conjunctiva/sclera: Conjunctivae normal.     Pupils: Pupils are equal, round, and reactive to light.  Neck:     Thyroid : No thyromegaly.     Vascular: No JVD.     Trachea: No tracheal deviation.  Cardiovascular:     Rate and  Rhythm: Normal rate and regular rhythm.     Heart sounds: Normal heart sounds. No murmur heard.    No friction rub. No gallop.  Pulmonary:     Effort: Pulmonary effort is normal. No respiratory distress.     Breath sounds: Normal breath sounds. No stridor. No wheezing or rales.  Chest:     Chest wall: No tenderness.  Abdominal:     General: Bowel sounds are normal. There is no distension.     Palpations: Abdomen is soft. There is no mass.     Tenderness: There is no abdominal tenderness. There is no guarding or rebound.     Hernia: No hernia is present.  Musculoskeletal:        General: No tenderness or deformity. Normal range of motion.     Cervical back: Normal range of motion and neck supple.  Lymphadenopathy:     Cervical: No cervical adenopathy.  Skin:    General: Skin is warm.     Coloration: Skin is not pale.     Findings: No erythema or rash.  Neurological:     Mental Status: He is alert and oriented to person, place, and time.     Cranial Nerves: No cranial nerve deficit.     Sensory: No sensory deficit.     Motor: No abnormal muscle tone.     Coordination: Coordination normal.     Deep Tendon Reflexes: Reflexes normal.  Psychiatric:        Behavior: Behavior normal.        Thought Content: Thought content normal.        Judgment: Judgment normal.           Assessment & Plan:  Controlled type 2 diabetes mellitus without complication, with long-term current use  of insulin  (HCC) - Plan: CBC with Differential/Platelet, Comprehensive metabolic panel with GFR, Lipid panel, Hemoglobin A1c, Microalbumin / creatinine urine ratio  History of prostate cancer - Plan: PSA  Benign essential HTN  Dyslipidemia  General medical exam  No visits with results within 1 Month(s) from this visit.  Latest known visit with results is:  Lab on 03/20/2024  Component Date Value Ref Range Status   WBC 03/20/2024 5.3  3.8 - 10.8 Thousand/uL Final   RBC 03/20/2024 4.07 (L)  4.20 -  5.80 Million/uL Final   Hemoglobin 03/20/2024 11.4 (L)  13.2 - 17.1 g/dL Final   HCT 89/72/7974 35.8 (L)  38.5 - 50.0 % Final   MCV 03/20/2024 88.0  80.0 - 100.0 fL Final   MCH 03/20/2024 28.0  27.0 - 33.0 pg Final   MCHC 03/20/2024 31.8 (L)  32.0 - 36.0 g/dL Final   Comment: For adults, a slight decrease in the calculated MCHC value (in the range of 30 to 32 g/dL) is most likely not clinically significant; however, it should be interpreted with caution in correlation with other red cell parameters and the patient's clinical condition.    RDW 03/20/2024 14.3  11.0 - 15.0 % Final   Platelets 03/20/2024 235  140 - 400 Thousand/uL Final   MPV 03/20/2024 10.7  7.5 - 12.5 fL Final   Neutro Abs 03/20/2024 2,327  1,500 - 7,800 cells/uL Final   Absolute Lymphocytes 03/20/2024 2,120  850 - 3,900 cells/uL Final   Absolute Monocytes 03/20/2024 541  200 - 950 cells/uL Final   Eosinophils Absolute 03/20/2024 201  15 - 500 cells/uL Final   Basophils Absolute 03/20/2024 111  0 - 200 cells/uL Final   Neutrophils Relative % 03/20/2024 43.9  % Final   Total Lymphocyte 03/20/2024 40.0  % Final   Monocytes Relative 03/20/2024 10.2  % Final   Eosinophils Relative 03/20/2024 3.8  % Final   Basophils Relative 03/20/2024 2.1  % Final   Glucose, Bld 03/20/2024 100 (H)  65 - 99 mg/dL Final   Comment: .            Fasting reference interval . For someone without known diabetes, a glucose value between 100 and 125 mg/dL is consistent with prediabetes and should be confirmed with a follow-up test. .    BUN 03/20/2024 20  7 - 25 mg/dL Final   Creat 89/72/7974 1.30 (H)  0.70 - 1.28 mg/dL Final   BUN/Creatinine Ratio 03/20/2024 15  6 - 22 (calc) Final   Sodium 03/20/2024 139  135 - 146 mmol/L Final   Potassium 03/20/2024 4.2  3.5 - 5.3 mmol/L Final   Chloride 03/20/2024 104  98 - 110 mmol/L Final   CO2 03/20/2024 25  20 - 32 mmol/L Final   Calcium  03/20/2024 9.2  8.6 - 10.3 mg/dL Final   Total Protein  03/20/2024 7.0  6.1 - 8.1 g/dL Final   Albumin 89/72/7974 4.1  3.6 - 5.1 g/dL Final   Globulin 89/72/7974 2.9  1.9 - 3.7 g/dL (calc) Final   AG Ratio 03/20/2024 1.4  1.0 - 2.5 (calc) Final   Total Bilirubin 03/20/2024 0.2  0.2 - 1.2 mg/dL Final   Alkaline phosphatase (APISO) 03/20/2024 59  35 - 144 U/L Final   AST 03/20/2024 16  10 - 35 U/L Final   ALT 03/20/2024 11  9 - 46 U/L Final   Hgb A1c MFr Bld 03/20/2024 6.4 (H)  <5.7 % Final   Comment: For someone without  known diabetes, a hemoglobin  A1c value between 5.7% and 6.4% is consistent with prediabetes and should be confirmed with a  follow-up test. . For someone with known diabetes, a value <7% indicates that their diabetes is well controlled. A1c targets should be individualized based on duration of diabetes, age, comorbid conditions, and other considerations. . This assay result is consistent with an increased risk of diabetes. . Currently, no consensus exists regarding use of hemoglobin A1c for diagnosis of diabetes for children. .    Mean Plasma Glucose 03/20/2024 137  mg/dL Final   eAG (mmol/L) 89/72/7974 7.6  mmol/L Final   Creatinine, Urine 03/20/2024 139  20 - 320 mg/dL Final   Microalb, Ur 89/72/7974 4.3  mg/dL Final   Comment: Reference Range Not established    Microalb Creat Ratio 03/20/2024 31 (H)  <30 mg/g creat Final   Comment: . The ADA defines abnormalities in albumin excretion as follows: SABRA Albuminuria Category        Result (mg/g creatinine) . Normal to Mildly increased   <30 Moderately increased         30-299  Severely increased           > OR = 300 . The ADA recommends that at least two of three specimens collected within a 3-6 month period be abnormal before considering a patient to be within a diagnostic category.    Cholesterol 03/20/2024 129  <200 mg/dL Final   HDL 89/72/7974 56  > OR = 40 mg/dL Final   Triglycerides 89/72/7974 83  <150 mg/dL Final   LDL Cholesterol (Calc) 03/20/2024  56  mg/dL (calc) Final   Comment: Reference range: <100 . Desirable range <100 mg/dL for primary prevention;   <70 mg/dL for patients with CHD or diabetic patients  with > or = 2 CHD risk factors. SABRA LDL-C is now calculated using the Martin-Hopkins  calculation, which is a validated novel method providing  better accuracy than the Friedewald equation in the  estimation of LDL-C.  Gladis APPLETHWAITE et al. SANDREA. 7986;689(80): 2061-2068  (http://education.[facility].com/faq/FAQ164)    Total CHOL/HDL Ratio 03/20/2024 2.3  <4.9 (calc) Final   Non-HDL Cholesterol (Calc) 03/20/2024 73  <130 mg/dL (calc) Final   Comment: For patients with diabetes plus 1 major ASCVD risk  factor, treating to a non-HDL-C goal of <100 mg/dL  (LDL-C of <29 mg/dL) is considered a therapeutic  option.    Vitamin B-12 03/20/2024 418  200 - 1,100 pg/mL Final   Patient's most recent A1c was outstanding.  I will recheck that today.  I will also check a fasting lipid panel.  I would like to keep his LDL cholesterol less than 70.  Blood pressure today is excellent.  I will check a CBC and a CMP.  I will also check a PSA while we are checking his lab work to ensure that his PSA levels remain undetectable.  Recommend an RSV vaccination as well as a Tdap.  Colonoscopy is due in 2027.  Diabetic foot exam was performed today and was normal.  Patient defers tetanus and RSV at the present time "

## 2024-06-02 ENCOUNTER — Ambulatory Visit: Payer: Self-pay | Admitting: Family Medicine

## 2024-06-02 LAB — LIPID PANEL
Cholesterol: 143 mg/dL
HDL: 62 mg/dL
LDL Cholesterol (Calc): 61 mg/dL
Non-HDL Cholesterol (Calc): 81 mg/dL
Total CHOL/HDL Ratio: 2.3 (calc)
Triglycerides: 122 mg/dL

## 2024-06-02 LAB — CBC WITH DIFFERENTIAL/PLATELET
Absolute Lymphocytes: 2052 {cells}/uL (ref 850–3900)
Absolute Monocytes: 561 {cells}/uL (ref 200–950)
Basophils Absolute: 99 {cells}/uL (ref 0–200)
Basophils Relative: 1.4 %
Eosinophils Absolute: 163 {cells}/uL (ref 15–500)
Eosinophils Relative: 2.3 %
HCT: 36.8 % — ABNORMAL LOW (ref 39.4–51.1)
Hemoglobin: 11.7 g/dL — ABNORMAL LOW (ref 13.2–17.1)
MCH: 27.5 pg (ref 27.0–33.0)
MCHC: 31.8 g/dL (ref 31.6–35.4)
MCV: 86.6 fL (ref 81.4–101.7)
MPV: 10.6 fL (ref 7.5–12.5)
Monocytes Relative: 7.9 %
Neutro Abs: 4225 {cells}/uL (ref 1500–7800)
Neutrophils Relative %: 59.5 %
Platelets: 262 Thousand/uL (ref 140–400)
RBC: 4.25 Million/uL (ref 4.20–5.80)
RDW: 14.4 % (ref 11.0–15.0)
Total Lymphocyte: 28.9 %
WBC: 7.1 Thousand/uL (ref 3.8–10.8)

## 2024-06-02 LAB — HEMOGLOBIN A1C
Hgb A1c MFr Bld: 6.1 % — ABNORMAL HIGH
Mean Plasma Glucose: 128 mg/dL
eAG (mmol/L): 7.1 mmol/L

## 2024-06-02 LAB — COMPREHENSIVE METABOLIC PANEL WITH GFR
AG Ratio: 1.2 (calc) (ref 1.0–2.5)
ALT: 11 U/L (ref 9–46)
AST: 15 U/L (ref 10–35)
Albumin: 4.1 g/dL (ref 3.6–5.1)
Alkaline phosphatase (APISO): 67 U/L (ref 35–144)
BUN/Creatinine Ratio: 17 (calc) (ref 6–22)
BUN: 23 mg/dL (ref 7–25)
CO2: 27 mmol/L (ref 20–32)
Calcium: 9.4 mg/dL (ref 8.6–10.3)
Chloride: 103 mmol/L (ref 98–110)
Creat: 1.37 mg/dL — ABNORMAL HIGH (ref 0.70–1.28)
Globulin: 3.3 g/dL (ref 1.9–3.7)
Glucose, Bld: 98 mg/dL (ref 65–99)
Potassium: 4.1 mmol/L (ref 3.5–5.3)
Sodium: 139 mmol/L (ref 135–146)
Total Bilirubin: 0.3 mg/dL (ref 0.2–1.2)
Total Protein: 7.4 g/dL (ref 6.1–8.1)
eGFR: 54 mL/min/1.73m2 — ABNORMAL LOW

## 2024-06-02 LAB — MICROALBUMIN / CREATININE URINE RATIO
Creatinine, Urine: 157 mg/dL (ref 20–320)
Microalb Creat Ratio: 35 mg/g{creat} — ABNORMAL HIGH
Microalb, Ur: 5.5 mg/dL

## 2024-06-02 LAB — PSA: PSA: <0.04 ng/mL

## 2024-06-02 NOTE — Progress Notes (Signed)
 Tried to call pt, recording says Call cannot be completed at this time, please try your call again later

## 2024-06-20 ENCOUNTER — Other Ambulatory Visit: Payer: Self-pay | Admitting: Family Medicine

## 2024-06-20 DIAGNOSIS — E119 Type 2 diabetes mellitus without complications: Secondary | ICD-10-CM

## 2024-06-22 ENCOUNTER — Other Ambulatory Visit: Payer: Self-pay

## 2024-06-22 ENCOUNTER — Telehealth: Payer: Self-pay | Admitting: Family Medicine

## 2024-06-22 DIAGNOSIS — E119 Type 2 diabetes mellitus without complications: Secondary | ICD-10-CM

## 2024-06-22 MED ORDER — SEMAGLUTIDE (2 MG/DOSE) 8 MG/3ML ~~LOC~~ SOPN: 2.0000 mg | PEN_INJECTOR | SUBCUTANEOUS | 0 refills | Status: AC

## 2024-06-22 NOTE — Telephone Encounter (Signed)
 Prescription Request  06/22/2024  LOV: 06/01/2024  What is the name of the medication or equipment?   Semaglutide , 2 MG/DOSE, 8 MG/3ML SOPN [[id number]]   Have you contacted your pharmacy to request a refill? Yes   Which pharmacy would you like this sent to?  [id number] [place], [place] - [address] [place] [address] Phone: [phone] Fax: [phone]    Patient notified that their request is being sent to the clinical staff for review and that they should receive a response within 2 business days.   Please advise pharmacist.

## 2024-06-22 NOTE — Telephone Encounter (Signed)
 Sent in medication

## 2024-06-27 ENCOUNTER — Other Ambulatory Visit: Payer: Self-pay | Admitting: Family Medicine

## 2024-07-06 ENCOUNTER — Ambulatory Visit: Payer: Medicare Other

## 2025-05-29 ENCOUNTER — Other Ambulatory Visit

## 2025-06-07 ENCOUNTER — Encounter: Admitting: Family Medicine
